# Patient Record
Sex: Female | Born: 1950
Health system: Southern US, Community
[De-identification: ages and names within clinical notes are randomized; demographics above are authoritative.]

## PROBLEM LIST (undated history)

## (undated) DIAGNOSIS — L309 Dermatitis, unspecified: Secondary | ICD-10-CM

## (undated) DIAGNOSIS — E785 Hyperlipidemia, unspecified: Secondary | ICD-10-CM

## (undated) DIAGNOSIS — T7840XA Allergy, unspecified, initial encounter: Secondary | ICD-10-CM

## (undated) DIAGNOSIS — J45909 Unspecified asthma, uncomplicated: Secondary | ICD-10-CM

## (undated) DIAGNOSIS — J301 Allergic rhinitis due to pollen: Secondary | ICD-10-CM

## (undated) DIAGNOSIS — Z9889 Other specified postprocedural states: Secondary | ICD-10-CM

## (undated) DIAGNOSIS — G43909 Migraine, unspecified, not intractable, without status migrainosus: Secondary | ICD-10-CM

## (undated) DIAGNOSIS — R112 Nausea with vomiting, unspecified: Secondary | ICD-10-CM

## (undated) HISTORY — DX: Migraine, unspecified, not intractable, without status migrainosus: G43.909

## (undated) HISTORY — PX: TUBAL LIGATION: SHX77

## (undated) HISTORY — DX: Allergy, unspecified, initial encounter: T78.40XA

## (undated) HISTORY — PX: BREAST SURGERY: SHX581

## (undated) HISTORY — DX: Dermatitis, unspecified: L30.9

## (undated) HISTORY — DX: Unspecified asthma, uncomplicated: J45.909

## (undated) HISTORY — PX: EYE SURGERY: SHX253

## (undated) HISTORY — DX: Hyperlipidemia, unspecified: E78.5

## (undated) HISTORY — PX: FOOT SURGERY: SHX648

## (undated) HISTORY — PX: KNEE SURGERY: SHX244

---

## 1997-08-20 ENCOUNTER — Ambulatory Visit (HOSPITAL_COMMUNITY): Admission: RE | Admit: 1997-08-20 | Discharge: 1997-08-20 | Payer: Self-pay | Admitting: Gynecology

## 1999-05-07 ENCOUNTER — Other Ambulatory Visit: Admission: RE | Admit: 1999-05-07 | Discharge: 1999-05-07 | Payer: Self-pay | Admitting: Gynecology

## 2000-10-19 ENCOUNTER — Other Ambulatory Visit: Admission: RE | Admit: 2000-10-19 | Discharge: 2000-10-19 | Payer: Self-pay | Admitting: Gynecology

## 2001-10-24 ENCOUNTER — Ambulatory Visit (HOSPITAL_COMMUNITY): Admission: RE | Admit: 2001-10-24 | Discharge: 2001-10-24 | Payer: Self-pay | Admitting: Internal Medicine

## 2002-06-26 ENCOUNTER — Other Ambulatory Visit: Admission: RE | Admit: 2002-06-26 | Discharge: 2002-06-26 | Payer: Self-pay | Admitting: Gynecology

## 2003-12-17 ENCOUNTER — Other Ambulatory Visit: Admission: RE | Admit: 2003-12-17 | Discharge: 2003-12-17 | Payer: Self-pay | Admitting: Gynecology

## 2004-07-18 ENCOUNTER — Ambulatory Visit (HOSPITAL_COMMUNITY): Admission: RE | Admit: 2004-07-18 | Discharge: 2004-07-18 | Payer: Self-pay | Admitting: Family Medicine

## 2004-08-01 ENCOUNTER — Ambulatory Visit (HOSPITAL_COMMUNITY): Admission: RE | Admit: 2004-08-01 | Discharge: 2004-08-01 | Payer: Self-pay | Admitting: Family Medicine

## 2004-12-17 ENCOUNTER — Ambulatory Visit (HOSPITAL_COMMUNITY): Admission: RE | Admit: 2004-12-17 | Discharge: 2004-12-17 | Payer: Self-pay | Admitting: Podiatry

## 2006-07-08 ENCOUNTER — Ambulatory Visit (HOSPITAL_COMMUNITY): Admission: RE | Admit: 2006-07-08 | Discharge: 2006-07-08 | Payer: Self-pay | Admitting: Family Medicine

## 2006-07-15 ENCOUNTER — Ambulatory Visit: Payer: Self-pay | Admitting: Gastroenterology

## 2006-07-22 ENCOUNTER — Ambulatory Visit: Payer: Self-pay | Admitting: Internal Medicine

## 2006-07-22 ENCOUNTER — Encounter (INDEPENDENT_AMBULATORY_CARE_PROVIDER_SITE_OTHER): Payer: Self-pay | Admitting: *Deleted

## 2006-07-22 ENCOUNTER — Ambulatory Visit (HOSPITAL_COMMUNITY): Admission: RE | Admit: 2006-07-22 | Discharge: 2006-07-22 | Payer: Self-pay | Admitting: Internal Medicine

## 2006-07-22 HISTORY — PX: ESOPHAGOGASTRODUODENOSCOPY: SHX1529

## 2006-07-22 HISTORY — PX: COLONOSCOPY: SHX174

## 2006-08-23 ENCOUNTER — Ambulatory Visit: Payer: Self-pay | Admitting: Internal Medicine

## 2006-08-30 ENCOUNTER — Ambulatory Visit (HOSPITAL_COMMUNITY): Admission: RE | Admit: 2006-08-30 | Discharge: 2006-08-30 | Payer: Self-pay | Admitting: Internal Medicine

## 2006-12-27 ENCOUNTER — Ambulatory Visit: Payer: Self-pay | Admitting: Internal Medicine

## 2007-11-25 ENCOUNTER — Ambulatory Visit (HOSPITAL_COMMUNITY): Admission: RE | Admit: 2007-11-25 | Discharge: 2007-11-25 | Payer: Self-pay | Admitting: Family Medicine

## 2007-12-09 ENCOUNTER — Ambulatory Visit: Payer: Self-pay | Admitting: Internal Medicine

## 2008-11-19 ENCOUNTER — Encounter (INDEPENDENT_AMBULATORY_CARE_PROVIDER_SITE_OTHER): Payer: Self-pay

## 2008-11-30 ENCOUNTER — Encounter: Payer: Self-pay | Admitting: Internal Medicine

## 2008-12-19 ENCOUNTER — Encounter: Payer: Self-pay | Admitting: Internal Medicine

## 2008-12-19 DIAGNOSIS — R7989 Other specified abnormal findings of blood chemistry: Secondary | ICD-10-CM | POA: Insufficient documentation

## 2008-12-19 LAB — CONVERTED CEMR LAB
ALT: 49 units/L — ABNORMAL HIGH (ref 0–35)
Bilirubin, Direct: 0.1 mg/dL (ref 0.0–0.3)
Ferritin: 178 ng/mL (ref 10–291)
Indirect Bilirubin: 0.4 mg/dL (ref 0.0–0.9)
Saturation Ratios: 52 % (ref 20–55)
TIBC: 314 ug/dL (ref 250–470)
Total Protein: 6.6 g/dL (ref 6.0–8.3)
UIBC: 150 ug/dL

## 2009-01-28 ENCOUNTER — Encounter: Payer: Self-pay | Admitting: Gastroenterology

## 2009-02-12 ENCOUNTER — Ambulatory Visit: Payer: Self-pay | Admitting: Internal Medicine

## 2009-02-12 DIAGNOSIS — K7689 Other specified diseases of liver: Secondary | ICD-10-CM | POA: Insufficient documentation

## 2009-02-12 DIAGNOSIS — K760 Fatty (change of) liver, not elsewhere classified: Secondary | ICD-10-CM | POA: Insufficient documentation

## 2009-02-18 DIAGNOSIS — Z860100 Personal history of colon polyps, unspecified: Secondary | ICD-10-CM | POA: Insufficient documentation

## 2009-02-20 ENCOUNTER — Encounter: Payer: Self-pay | Admitting: Internal Medicine

## 2009-02-20 ENCOUNTER — Encounter (INDEPENDENT_AMBULATORY_CARE_PROVIDER_SITE_OTHER): Payer: Self-pay

## 2009-02-21 LAB — CONVERTED CEMR LAB
AST: 29 units/L (ref 0–37)
Albumin: 4.2 g/dL (ref 3.5–5.2)
Alkaline Phosphatase: 99 units/L (ref 39–117)
Ferritin: 184 ng/mL (ref 10–291)
Iron: 162 ug/dL — ABNORMAL HIGH (ref 42–145)
Total Protein: 6.6 g/dL (ref 6.0–8.3)
UIBC: 155 ug/dL

## 2009-03-26 ENCOUNTER — Other Ambulatory Visit: Admission: RE | Admit: 2009-03-26 | Discharge: 2009-03-26 | Payer: Self-pay | Admitting: Obstetrics & Gynecology

## 2009-03-27 ENCOUNTER — Ambulatory Visit (HOSPITAL_COMMUNITY): Admission: RE | Admit: 2009-03-27 | Discharge: 2009-03-27 | Payer: Self-pay | Admitting: Obstetrics & Gynecology

## 2009-05-15 ENCOUNTER — Ambulatory Visit (HOSPITAL_COMMUNITY): Admission: RE | Admit: 2009-05-15 | Discharge: 2009-05-15 | Payer: Self-pay | Admitting: Family Medicine

## 2009-07-29 ENCOUNTER — Encounter (INDEPENDENT_AMBULATORY_CARE_PROVIDER_SITE_OTHER): Payer: Self-pay

## 2009-08-02 ENCOUNTER — Telehealth (INDEPENDENT_AMBULATORY_CARE_PROVIDER_SITE_OTHER): Payer: Self-pay

## 2010-08-07 NOTE — Letter (Signed)
Summary: Recall, Labs Needed  [facility]  [address]   [place], [place] [address]   Phone: [phone]  Fax: [phone]    July 29, 2009  JARRETT CHICOINE [address] [place], [place]  [address] Sep 06, 1950   Dear Ms. [facility],   Our records indicate it is time to repeat your blood work.  You can take the enclosed form to the lab on or near the date indicated.  Please make note of the new location of the lab:   [address], 2nd floor   [facility]  Our office will call you within a week to ten business days with the results.  If you do not hear from [place] in 10 business days, you should call the office.  If you have any questions regarding this, call the office at [phone], and ask for the nurse.  Labs are due on 08/13/2009.   Sincerely,    Hendricks Limes LPN  [facility] Ph: [phone]   Fax: [phone]

## 2010-08-07 NOTE — Progress Notes (Signed)
----   Converted from flag ---- ---- 08/02/2009 10:58 AM, Diana Eves wrote: Constance Goltz March 07, 2051 CALLED, SHE SAID SHE GOT A LETTER ABOUT HAVING HER LABS DONE. SHE IS NOW UNEMPLOYED AND NO INSURANCE. SHE WILL HAVE THIS DONE ONCE SHE GETS BACK ON HER FEET FINANCIALLY. ------------------------------

## 2010-11-18 NOTE — Assessment & Plan Note (Signed)
Julie Brown, Julie Brown                CHART#:  [id number]   DATE:  12/09/2007                       DOB:  25-Apr-1951   FOLLOWUP:  A fatty-appearing liver on CT, elevated SGPT, compound  heterozygote for hemochromatosis, and left lower rib cage pain.   Julie Brown returns for a 1-year followup.  We saw her for flank and back  pain last year, she had been extensively evaluated.  She has had a  colonoscopy and EGD, which demonstrated a small hiatal hernia and  adenomatous polyps, which were removed.  She is due for surveillance  colonoscopy in 2013.  It is felt that her left rib cage pain is more  musculoskeletal in origin.  She apparently has stable calcified  pulmonary nodules for which she recently had a chest CT (do not have a  report), but she was told that things looked good.  She has not smoked  in 20 years.   She lastly had a blood work on Nov 07, 2007.  Her iron saturation was 50.  Her serum iron was slightly up at 150.  Her LFTs look perfect except for  SGPT, one point above normal at 36.  Ferritin was 120.  This is actually  improved from April 08, 2007, where her iron was 216.  Her percent  saturation was 61%.  Ferritin was 228.  She used to consume 3 beers  daily, but has not consumed any alcohol in about a year now.  She is  trying to get more fiber in and get more exercise.   Her weight is down 8 pounds since her December 27, 2006, office visit here.   She is really not having any GI symptoms at this time except for 3-4  days out of a month.  She may have multiple episodes of diarrhea first  thing in the morning and then it tapers off later in the day.  Otherwise, she has 1-2 formed bowel movements daily.  She has not had  any melena or rectal bleeding.   CURRENT MEDICATIONS:  See updated list.   ALLERGIES:  CODEINE.   PHYSICAL EXAMINATION:  GENERAL:  Today, pleasant 60 year old lady  resting comfortably.  VITAL SIGNS:  Weight 205, height 5 feet 4 inches, temperature  98.1, BP  128/82, and pulse 88.  SKIN:  Warm and dry.  There is no bronzing, no jaundice, and no  cutaneous stigmata of chronic liver disease.  HEENT:  No scleral icterus.  Conjunctivae are pink.  CHEST:  Lungs are clear to auscultation.  CARDIAC:  Regular rate and rhythm without murmur, gallop, or rub.  ABDOMEN:  Nondistended.  Positive bowel sounds.  Soft.  No obvious mass  or hepatosplenomegaly.  EXTREMITIES:  No edema.   IMPRESSION:  1. Left costal margin flank pain, not likely gastrointestinal in      origin, has been stable for a prolonged period of time.  Followup      per Dr. Gerda Diss.  2. History of colonic adenoma, due for surveillance colonoscopy in      2013.  3. History of a fatty-appearing liver on CT and compound heterozygous      for hemochromatosis.  She has a compound heterozygous and these      folks rarely accumulate iron, but it can happen over time.  We  always need to keep the possibility of non-HFE hemochromatosis in      mind, but her more recent iron studies are much better, and I would      like to see her LFTs completely normalized.  4. Intermittent early morning diarrhea, most consistent with irritable      bowel syndrome.   RECOMMENDATIONS:  1. Continue alcohol abstinence, exercise, healthy lifestyle, and      continued weight loss.  2. Repeat hepatic profile in 3 months.  We will plan at a minimum due      iron studies in 1 year when she returns.  3. We will give her a prescription for Levsin sublingual 0.125 mg.      She is to take 1 before meals on a p.r.n. basis for her occasional      symptoms of diarrhea.  If this is not satisfactory, she is to let      me know.        Jonathon Bellows, M.D.  Electronically Signed     RMR/MEDQ  D:  12/09/2007  T:  12/09/2007  Job:  [id number]   cc:   Lorin Picket A. Gerda Diss, MD

## 2010-11-18 NOTE — Assessment & Plan Note (Signed)
Julie Brown, Julie Brown                CHART#:  [id number]   DATE:  12/27/2006                       DOB:  29-Dec-1950   CHIEF COMPLAINT:  Follow up left upper quadrant abdominal pain.   SUBJECTIVE:  The patient is a 60 year old female who has a history of  chronic left-sided left flank and left upper quadrant pain.  She  underwent MR of the T and L spine by Dr. Jena Gauss on August 30, 2006.  She was found to have mild degenerative disk disease of the thoracic  spine without notable central canal or foraminal narrowing.  She also  had degenerative disease of the cervical spine as well.  She has a  history of fatty liver seen on CT scan.  She has had an elevated ALT.  Recent LFTs from Dec 02, 2006, shows an ALT of 55 and otherwise normal  LFTs.  She does continue to drink approximately 3 beers a night and has  for many years now.  She denies any problems with nausea, vomiting,  heartburn, indigestion, or abdominal pain other than an aching in her  left side and left flank area just below her left ribs.  She describes  the pain as anywhere from 4 to 8 on a pain scale.  It is not associated  with eating and is not worsened with movement.  She has been diagnosed  with arthritis.  She denies any urinary symptoms.  She does take a rare  Vicodin as needed.  Her weight is up 4 pounds in the last 4 months.   CURRENT MEDICATIONS:  See list from December 27, 2006.   ALLERGIES:  CODEINE.   PHYSICAL EXAMINATION:  VITAL SIGNS:  Weight 213 pounds, height 64  inches.  Temperature 97.9, blood pressure 140/96, pulse 80.  GENERAL:  The patient is an obese Caucasian female who is alert and  oriented, pleasant and cooperative, in no acute distress.  HEENT:  Sclerae are clear, nonicteric.  Conjunctivae are pink.  Oropharynx pink and moist without any lesions.  CHEST:  Heart regular rate and rhythm, normal S1, S2.  ABDOMEN:  Positive bowel sounds x4.  No bruits auscultated.  Soft,  nontender, nondistended,  without palpable mass or hepatosplenomegaly.  No rebound tenderness or guarding.  EXTREMITIES:  Without clubbing or edema bilaterally.  SKIN:  Pink, warm and dry without any rash or jaundice.   ASSESSMENT:  The patient is a 60 year old Caucasian female with left  rib, left flank, left side pain, which I suspect is of musculoskeletal  origin.  She also has fatty liver and a mildly elevated ALT.  This could  be related to her daily alcohol consumption but at this point since it  has been persistent, would rule out hepatitis B and C, although this is  an unlikely cause.  She also has a small nodule in the right lower lobe  of her chest, which needs to be followed up by Dr. Gerda Diss, and a history  of adenomatous polyps.   PLAN:  1. Colonoscopy in January 2013 for follow-up of adenomatous polyps.  2. Check HCV antibody and hepatitis B surface antigen.  3. Repeat LFTs in 3 months.  4. If LFTs remain stable or normal, will follow up in 1 year.      Otherwise, she is going to need sooner follow-up.  5. I have asked her to significantly decrease her alcoholic beverage      intake to a couple of beers a couple of times a week rather than on      a daily basis.  6. Follow with chest CT in January 2009 through Dr. Fletcher Anon office.       Julie Brown, N.P.  Electronically Signed     R. Roetta Sessions, M.D.  Electronically Signed    KJ/MEDQ  D:  12/27/2006  T:  12/28/2006  Job:  [id number]   cc:   Lorin Picket A. Gerda Diss, MD

## 2010-11-21 NOTE — Op Note (Signed)
NAMEKERRIE, Julie Brown               ACCOUNT NO.:  [id number]   MEDICAL RECORD NO.:  [id number]          PATIENT TYPE:  AMB   LOCATION:  DAY                           FACILITY:  [facility]   PHYSICIAN:  R. Roetta Sessions, M.D. DATE OF BIRTH:  1950-07-15   DATE OF PROCEDURE:  07/22/2006  DATE OF DISCHARGE:                               OPERATIVE REPORT   Esophagogastroduodenoscopy, diagnostic, followed by colonoscopy with  snare polypectomy.   INDICATIONS FOR PROCEDURE:  A 55-year lady with left upper quadrant  abdominal pain, question gastric wall thickening on CT, nonspecific  stranding in the mesentery right lower quadrant, fatty liver with normal  LFTs, 3-mm right lower lobe lung nodule to be followed up further by Dr.  Gerda Diss as appropriate.  Positive family history of colon cancer in a  first degree relative at a young age.  EGD and colonoscopy are now being  done.  This approach has been discussed the patient at length.  Potential risks, benefits and alternatives have been reviewed, questions  answered.  She is agreeable.  Please documentation in the medical  record.   PROCEDURE NOTE:  O2 saturation, blood pressure, pulse and respirations  monitored throughout the entirety of both procedures.  Conscious  sedation with Versed 10 mg IV, Demerol 25 mg IV in divided doses.  Cetacaine spray for topical oropharyngeal anesthesia.   INSTRUMENT:  Pentax video chip system.   FINDINGS:  Esophagogastroduodenoscopy:  Examination of tubular esophagus  revealed no mucosal abnormalities.  EG junction easily traversed.   Stomach:  Gastric cavity was emptied and insufflated well with air.  Thorough examination of gastric mucosa including retroflexion to view  the proximal stomach and esophagogastric junction demonstrated a small  hiatal hernia.  Otherwise gastric mucosa appeared normal.  Stomach was  insufflated very well.  Pylorus patent, easily traversed.  Examination  of the bulb and second  portion revealed no abnormalities.   THERAPEUTIC/DIAGNOSTIC MANEUVERS PERFORMED:  None.   The patient tolerated the procedure well.  She was prepared for  colonoscopy.  Digital rectal exam revealed no abnormalities.   ENDOSCOPIC FINDINGS:  Prep was good.   Colon:  Colonic mucosa was surveyed from the rectosigmoid junction  through the left, transverse and right colon to the area of the  appendiceal orifice, ileocecal valve and cecum.  These structures were  well seen and photographed for the record.  Terminal ileum was intubated  to 5 cm.  From this level, scope was slowly withdrawn.  All previously  mentioned mucosal surfaces were again seen.  The patient had shallow  narrow mouth left-sided diverticula.  Multiple 5-mm polyps in the  hepatic and splenic flexures, sigmoid and rectum.  These were hot or  cold snare removed.  The scope was pulled down into the rectum and  additionally surveyed, including retroflexed view of the anal verge.  Aside from a single 5-mm polyp which was hot snare removed, the rectal  mucosa appeared normal.  The patient tolerated both procedures well and  was reactive to endoscopy.   IMPRESSION:  Normal esophagus, small hiatal hernia otherwise normal  stomach,  normal D1/D2.   Colonoscopy findings:  1. Rectal and colonic polyps as described above, removed with hot      snare.  2. Left-sided diverticula. The remainder of colon and rectum appeared      normal.  Terminal ileum appeared normal.   Today's findings do not reveal a cause of the patient's left sided  abdominal pain.   RECOMMENDATIONS:  1. No aspirin or arthritis medications for 10 days.  2. Follow-up on pathology.  3. Diverticulosis literature provided to Julie Brown.  4. Daily fiber supplementation.  5. Follow-up appointment with [place] in 1 month.   ADDENDUM:  CBC, amylase and lipase were all within normal limits through  our office on July 16, 2006.      Jonathon Bellows, M.D.   Electronically Signed     RMR/MEDQ  D:  07/22/2006  T:  07/22/2006  Job:  [id number]   cc:   Gerda Diss, M.D.

## 2010-11-21 NOTE — Op Note (Signed)
Julie Brown, Julie Brown               ACCOUNT NO.:  [id number]   MEDICAL RECORD NO.:  [id number]          PATIENT TYPE:  AMB   LOCATION:  DAY                           FACILITY:  [facility]   PHYSICIAN:  Denny Peon. Ulice Brilliant, D.P.M.  DATE OF BIRTH:  [birth year]   DATE OF PROCEDURE:  12/17/2004  DATE OF DISCHARGE:  12/17/2004                                 OPERATIVE REPORT   PREOPERATIVE DIAGNOSIS:  Chronic capsulitis sub fifth metatarsal with  plantar flexor fifth metatarsal left foot.   POSTOPERATIVE DIAGNOSIS:  Chronic capsulitis sub fifth metatarsal with  plantar flexor fifth metatarsal left foot.   PROCEDURES:  Elevating osteotomy with fifth metatarsal left foot.   SURGEON:  Denny Peon. Ulice Brilliant, D.P.M.   ANESTHESIA:  MAC.   INDICATIONS FOR SURGERY:  A 52-month history of pain sub fifth metatarsal  left foot which has been treated conservatively with changes in shoe gear,  non-steroidal anti-inflammatory medication, injection therapy, none of which  has helped long term.  The patient has a clinically plantar flexed fifth  metatarsal with pain to palpation.  The patient has requested surgical  correction.   DESCRIPTION OF PROCEDURE:  Julie Brown was brought into the OR and placed on  the table in supine position.  IV sedation was established, and Mayo block  was performed about the fifth MTP of her left foot.  A pneumatic ankle  tourniquet was applied across her left ankle.  Her foot is prepped and  draped in the usual aseptic fashion.  An ACE bandage utilized to  exsanguinate her foot.  The tourniquet was inflated to 250 mmHg.   PROCEDURES:  Elevating osteotomy fifth metatarsal left foot.  Attention was  directed to the fifth metatarsal phalangeal joint.  A 4 cm dorsal inner skin  incision.  Incision is deepened by sharp and blunt dissection.  The extensor  tendon to the fifth toe is identified, undermined and retracted medially.  The fifth metatarsal is encountered.  It's boundaries are  appreciated.  A  dorsal linear capsulotomy is performed.  The capsular no acute distress  periosteal tissues are reflected away from the surgical head and neck of the  fifth metatarsal.  An oblique osteotomy is then made with the orientation of  the osteotomy, osteotomy being from distal lateral to proximal medial.  The  osteotomy is made completely through the medial and lateral cortex of the  bone.  The capital fragment is then relocated slightly proximally, slightly  medially and about 3-4 mm dorsally elevating the fifth metatarsal from the  weight bearing surface.  This is then fixated by 2.0x14 mm Osteomed screw,  self tapping screw fixation.  Osteotomy is deemed stable.  Redundant bone  laterally and plantarly is excised with a bone rongeur.  The newly formed  osseous surface is rasped smooth.  Postoperative images obtained by a Zy  scan.  The wound was flushed with copious amounts of irrigant. Capsular and  periosteal tissues were closed with 4-0 Vicryl.  Subcutaneous tissues  reapproximated and closed with a running horizontal mattress suture, 4-0  Vicryl.  Skin is closed  with 4-0 Vicryl and subcuticular suture.  Steri-  Strips were applied across the incision.  A postoperative injection of  Marcaine __________ dispensed.  Betadine soaked Adaptic dressing and dry  sterile compressive dressing follows.  Tourniquet was deflated with  tourniquet time spending 35 minutes.   Julie Brown tolerated the incision procedure well.  She was placed into a Cam  walker postoperatively.  Prescription for Tilden Fossa is dispensed.  A  first postoperative visit appointment is made.  She will be seen within  seven days for first postop visit by Dr. Emilio Math.       CMD/MEDQ  D:  12/18/2004  T:  12/19/2004  Job:  [id number]

## 2010-11-21 NOTE — Op Note (Signed)
[facility]  Patient:    Julie Brown, Julie Brown. Visit Number: [id number] MRN: [id number]          Service Type: Attending:  Roetta Sessions, M.D. Dictated by:   Roetta Sessions, M.D. Proc. Date: 10/24/01   CC:         Loran Senters, M.D.   Operative Report  PROCEDURE:  Diagnostic colonoscopy.  INDICATIONS FOR PROCEDURE:  The patient is a 60 year old lady with an episode of hematochezia recently. Colonoscopy was done to further evaluate her symptoms. She has a positive family history of colorectal cancer. Aside from this recent three day history of some blood per rectum, she has not had any blood per rectum at other times and is devoid of any large ______ symptoms. This approach has been discussed with [name]. The potential risks, benefits, and alternatives have been reviewed, questions answered and she is agreeable. Please see my dictated consultation note for more information.  PROCEDURE NOTE:  O2 saturation, blood pressure, pulse, and respirations were monitored throughout the entire procedure. Conscious sedation Versed 5 mg IV in divided doses, Demerol 100 mg IV in divided doses. Cetacaine spray for topical pharyngeal anesthesia. Instrument Olympus video colonoscope.  FINDINGS:  Digital rectal examination revealed no abnormalities.  ENDOSCOPIC FINDINGS:  The prep was adequate.  RECTUM:  Examination of the rectal mucosa including retroflexed view of the anal verge and ______ revealed only minimal internal hemorrhoids.  COLON:  The colonic mucosa was thoroughly debrided from the rectosigmoid junction through the left transverse and right colon to the area of the appendiceal orifice, ileocecal valve, and cecum. These structures were well seen and photographed. No colonic mucosal abnormalities were noted upon advancing the scope from the level of cecum to the ileocecal valve. The scope was slowly withdrawn. All previously mentioned mucosal surfaces  were again seen and again no abnormalities were observed. The patient tolerated the procedure well and was reacted in endoscopy.  IMPRESSION:  1. Minimal internal anal canal hemorrhoids otherwise normal rectum.  2. Normal colon.  RECOMMENDATIONS:  1. A 10 day course of Anusol-HC suppositories one per rectum at bedtime.  2. Hemorrhoid literature, increase fiber in diet.  3. Repeat colonoscopy in five years. Dictated by:   Roetta Sessions, M.D. Attending:  Roetta Sessions, M.D. DD:  10/24/01 TD:  10/24/01 Job: [id number]

## 2010-11-21 NOTE — H&P (Signed)
Julie Brown, Julie Brown               ACCOUNT NO.:  [id number]   MEDICAL RECORD NO.:  [id number]          PATIENT TYPE:  AMB   LOCATION:  DAY                           FACILITY:  [facility]   PHYSICIAN:  Denny Peon. Ulice Brilliant, D.P.M.  DATE OF BIRTH:  21-Mar-1951   DATE OF ADMISSION:  12/17/2004  DATE OF DISCHARGE:  LH                                HISTORY & PHYSICAL   HISTORY OF PRESENT ILLNESS:  Julie Brown has pain in the sub-fifth metatarsal  region of her left foot which has been present for about nine months.  [name] relates that along the lines of October 2005, she jumped out of a [employer]  and landed on her foot awkwardly and her foot has not gotten any better  since that time.  Julie Brown has been treated conservatively by reducing  pressure on the area, anti-inflammatory medication, injection therapy, none  of which has helped considerably.  She has been through a plain film x-ray  and a bone scan which are relatively unremarkable.   PAST MEDICAL HISTORY:  Essentially is unremarkable.   MEDICATIONS:  No medications at this point.   ALLERGIES:  CODEINE, which causes nausea and vomiting.   SOCIAL HISTORY:  She does not smoke.  She drinks very occasionally.   PHYSICAL EXAMINATION:  She is uncomfortable with deep palpation of sub-fifth  metatarsal of the left foot.  She is noted clinically to have a plantar  flexed fifth metatarsal.  Again, as stated, both bone scan and radiograph  were relatively unremarkable.   ASSESSMENT:  Plantar flexed fifth metatarsal with capsulitis _____________,  not improved over a nine month period of time with injection therapy and non-  steroidal anti-inflammatory drugs.   PLAN:  I have described to Julie Brown that if she has not gotten better then  she is probably going to need to have the fifth metatarsal head elevated via  a surgical osteotomy.  I have described the procedure to her.  This will  consist of a through-and-through cut of the fifth metatarsal  surgical neck  migrating the fifth metatarsal head dorsally and then fixating this via 2.0  Osteo-Med screw.  She will be in a CAM walker postoperatively and a surgical  shoe.  I have described this to Julie Brown.  She has read a consent form,  apparently understood, and signed.  We will do this under monitored  anesthesia care.       CMD/MEDQ  D:  12/16/2004  T:  12/16/2004  Job:  [id number]

## 2010-11-21 NOTE — Consult Note (Signed)
Julie Brown, Julie Brown               ACCOUNT NO.:  [id number]   MEDICAL RECORD NO.:  [id number]          PATIENT TYPE:  AMB   LOCATION:  DAY                           FACILITY:  [facility]   PHYSICIAN:  R. Roetta Sessions, M.D. DATE OF BIRTH:  July 06, 1951   DATE OF CONSULTATION:  07/15/2006  DATE OF DISCHARGE:                                 CONSULTATION   REASON FOR CONSULTATION:  Abnormal CT, abdominal pain.   HISTORY OF PRESENT ILLNESS:  Julie Brown is a 60 year old lady who presents  for further evaluation of persistent abdominal pain. She states her  abdominal pain began in December of 2007. It is located in the left  upper quadrant and down the left mid abdomen. She describes the pain as  constant, usually a 5 out of 10 on a pain scale. At times it is severe,  and 8 out of 10. It seems to be worse with movement or with prolonged  sitting or standing. It seems to be worse after eating fish yesterday.  Bowel movements have been unchanged. She has daily bowel movements with  no melena. Occasionally has fresh blood on the toilet tissue. She has a  history of internal hemorrhoids. She denies any typical heart burn  symptoms, dysphagia or odynophagia. Her appetite has been good. She has  no unintentional weight loss. Denies any dysuria, hematuria.   She had a CT of the abdomen and pelvis on July 08, 2006. This revealed  a tiny 3 mm in diameter nodule in the right lower lobe of the lung,  questionably calcified. Marked fatty infiltration of the liver. There  was incomplete gastric distention with prominence of the fundal wall  making it impossible to exclude wall thickening including inflammation  and tumor. She had minimal nonspecific stranding of the mesentery in the  right lower quadrant region. Questionable uterine fibroid versus  septation anomaly of the uterus. Liver function tests were normal. Met-7  unremarkable. CBC normal. She was started on Protonix which has not  seemed to help but it  has only been 3 days.   CURRENT MEDICATIONS:  1. Zyrtec 10 mg daily.  2. Multivitamin daily.  3. Aspirin 325 mg daily.  4. Protonix 40 mg daily.  5. Hydrocodone 5/500 one q.6 hours as needed.  6. Fish oil two daily.  7. Calcium and vitamin D 1200 mg daily.   ALLERGIES:  CODEINE.   PAST MEDICAL HISTORY:  1. Seasonal allergies.  2. History of tubal ligation.  3. Left breast surgery for a lump.  4. Left foot surgery.  5. Oral surgery.  6. Patient had a colonoscopy in April, 2003. She had minimal internal      anal canal hemorrhoids otherwise negative.   FAMILY HISTORY:  Mother had a resection for colon cancer when in her  50's; she had another primary colon cancer last year recurring  resection; primary kidney and bladder cancer as well. The kidney cancer  recurred once and she required chemotherapy.   SOCIAL HISTORY:  She is married and has one child. She works at the  [employer]. She quit smoking 18  years ago. She consumes two beers  daily.   REVIEW OF SYSTEMS:  See history of present illness for GI.  CARDIOPULMONARY: No chest pain or shortness of breath. See HPI for GU.   PHYSICAL EXAMINATION:  VITAL SIGNS: Weight is 213 pounds. Height 5 feet,  4 inches. Temperature 97.6, blood pressure 132/84, pulse 64.  GENERAL: A pleasant, obese Caucasian female in no acute distress.  SKIN: Warm and dry, no jaundice.  HEENT: Sclerae non icteric. Oropharyngeal mucosa moist and pink, no  lesion, erythema or exudates.  NECK: No lymphadenopathy or thyromegaly.  CHEST: Lungs are clear to auscultation.  CARDIAC EXAM: Reveals regular rate and rhythm, no murmurs, rubs, or  gallops.  ABDOMEN: Positive bowel sounds, obese but symmetrical, soft. She has  mild to moderate epigastric tenderness, she has moderate tenderness in  the left upper quadrant all the way down the left mid abdomen to deep  palpation. No tenderness along the rib margin or in the left flank. No  guarding or rebound  tenderness. No organomegaly or masses appreciated.  EXTREMITIES: No edema.   CT of abdomen and pelvis as outlined above. In addition she had a small  soft tissue nodule at the splenic hilum, question small splenule 11 mm  in diameter, less likely lymph node.   IMPRESSION:  Julie Brown is a 60 year old lady with recent development of  persistent left upper quadrant/left mid abdominal pain. She also has  epigastric discomfort on exam. CT really is unrevealing with regards to  the cause of her abdominal pain. There is a question of gastric wall  thickening versus under-distention on CT as well as some nonspecific  stranding in the mesentery although this is in her right lower quadrant  where she does not have any pain. She does have a fatty liver with  normal LFTs. She also has a 3 mm nodule in the right lower lobe of the  lung, will leave that up to Dr. Gerda Diss for any further evaluation  needed. I have discussed with her today that I do not feel that any  abnormal found in the stomach would explain the extent of her left-side  abdominal pain but we ought to go ahead and proceed with upper endoscopy  for further evaluation there. She is also due for high risk surveillance  colonoscopy given her family history of colorectal cancer in 3 months  and will go ahead and pursue that now, given unexplained left side  abdominal pain.   PLAN:  1. Esophagogastroduodenoscopy and coloscopy in the near future with      Dr. Jena Gauss.  2. Trial of Levbid #60 one p.o. b.i.d. p.r.n. abdominal pain, 1      refill.  3. We recheck her CBC and amylase and lipase.  4. Further recommendations to follow.     Dictated by Tana Coast, P.A.      Tana Coast, P.AJonathon Bellows, M.D.  Electronically Signed    LL/MEDQ  D:  07/15/2006  T:  07/15/2006  Job:  [id number]

## 2011-06-23 ENCOUNTER — Encounter: Payer: Self-pay | Admitting: Internal Medicine

## 2012-01-06 ENCOUNTER — Encounter (HOSPITAL_COMMUNITY)
Admission: EM | Disposition: A | Discharge status: Home / Self Care | Payer: Self-pay | Source: Home / Self Care | Attending: General Surgery

## 2012-01-06 ENCOUNTER — Inpatient Hospital Stay (HOSPITAL_COMMUNITY): Payer: Self-pay | Admitting: Anesthesiology

## 2012-01-06 ENCOUNTER — Inpatient Hospital Stay (HOSPITAL_COMMUNITY)
Admission: EM | Admit: 2012-01-06 | Discharge: 2012-01-07 | DRG: 419 | Disposition: A | Discharge status: Home / Self Care | Payer: Self-pay | Attending: General Surgery | Admitting: General Surgery

## 2012-01-06 ENCOUNTER — Encounter (HOSPITAL_COMMUNITY): Payer: Self-pay

## 2012-01-06 ENCOUNTER — Emergency Department (HOSPITAL_COMMUNITY): Payer: Self-pay

## 2012-01-06 ENCOUNTER — Encounter (HOSPITAL_COMMUNITY): Payer: Self-pay | Admitting: Anesthesiology

## 2012-01-06 DIAGNOSIS — K810 Acute cholecystitis: Principal | ICD-10-CM | POA: Diagnosis present

## 2012-01-06 DIAGNOSIS — Z885 Allergy status to narcotic agent status: Secondary | ICD-10-CM

## 2012-01-06 DIAGNOSIS — Z87891 Personal history of nicotine dependence: Secondary | ICD-10-CM

## 2012-01-06 HISTORY — PX: CHOLECYSTECTOMY: SHX55

## 2012-01-06 HISTORY — DX: Other specified postprocedural states: Z98.890

## 2012-01-06 HISTORY — DX: Allergic rhinitis due to pollen: J30.1

## 2012-01-06 HISTORY — DX: Nausea with vomiting, unspecified: R11.2

## 2012-01-06 LAB — CBC WITH DIFFERENTIAL/PLATELET
Basophils Absolute: 0 10*3/uL (ref 0.0–0.1)
Basophils Relative: 0 % (ref 0–1)
Eosinophils Absolute: 0.1 10*3/uL (ref 0.0–0.7)
MCH: 32.3 pg (ref 26.0–34.0)
MCHC: 35.3 g/dL (ref 30.0–36.0)
Neutro Abs: 5.5 10*3/uL (ref 1.7–7.7)
Neutrophils Relative %: 65 % (ref 43–77)
Platelets: 215 10*3/uL (ref 150–400)
RDW: 12.4 % (ref 11.5–15.5)

## 2012-01-06 LAB — COMPREHENSIVE METABOLIC PANEL
AST: 30 U/L (ref 0–37)
Albumin: 4 g/dL (ref 3.5–5.2)
Alkaline Phosphatase: 107 U/L (ref 39–117)
BUN: 19 mg/dL (ref 6–23)
Potassium: 3.7 mEq/L (ref 3.5–5.1)
Sodium: 139 mEq/L (ref 135–145)
Total Protein: 7.1 g/dL (ref 6.0–8.3)

## 2012-01-06 LAB — LIPASE, BLOOD: Lipase: 24 U/L (ref 11–59)

## 2012-01-06 LAB — SURGICAL PCR SCREEN
MRSA, PCR: NEGATIVE
Staphylococcus aureus: NEGATIVE

## 2012-01-06 SURGERY — LAPAROSCOPIC CHOLECYSTECTOMY
Anesthesia: General | Site: Abdomen | Wound class: Contaminated

## 2012-01-06 MED ORDER — ROCURONIUM BROMIDE 100 MG/10ML IV SOLN
INTRAVENOUS | Status: DC | PRN
  Administered 2012-01-06: 5 mg via INTRAVENOUS
  Administered 2012-01-06: 25 mg via INTRAVENOUS

## 2012-01-06 MED ORDER — SUCCINYLCHOLINE CHLORIDE 20 MG/ML IJ SOLN
INTRAMUSCULAR | Status: DC | PRN
  Administered 2012-01-06: 50 mg via INTRAVENOUS
  Administered 2012-01-06: 140 mg via INTRAVENOUS

## 2012-01-06 MED ORDER — SODIUM CHLORIDE 0.9 % IJ SOLN
INTRAMUSCULAR | Status: AC
  Administered 2012-01-06: 10 mL
  Filled 2012-01-06: qty 3

## 2012-01-06 MED ORDER — ONDANSETRON HCL 4 MG/2ML IJ SOLN
4.0000 mg | Freq: Once | INTRAMUSCULAR | Status: AC
  Administered 2012-01-06: 4 mg via INTRAVENOUS
  Filled 2012-01-06: qty 2

## 2012-01-06 MED ORDER — DEXAMETHASONE SODIUM PHOSPHATE 4 MG/ML IJ SOLN
INTRAMUSCULAR | Status: AC
  Administered 2012-01-06: 4 mg via INTRAVENOUS
  Filled 2012-01-06: qty 1

## 2012-01-06 MED ORDER — FENTANYL CITRATE 0.05 MG/ML IJ SOLN
25.0000 ug | INTRAMUSCULAR | Status: DC | PRN
  Administered 2012-01-06 (×4): 50 ug via INTRAVENOUS

## 2012-01-06 MED ORDER — LIDOCAINE HCL (CARDIAC) 10 MG/ML IV SOLN
INTRAVENOUS | Status: DC | PRN
  Administered 2012-01-06: 10 mg via INTRAVENOUS

## 2012-01-06 MED ORDER — HYDROMORPHONE HCL PF 1 MG/ML IJ SOLN
1.0000 mg | Freq: Once | INTRAMUSCULAR | Status: AC
  Administered 2012-01-06: 1 mg via INTRAVENOUS
  Filled 2012-01-06: qty 1

## 2012-01-06 MED ORDER — CEFAZOLIN SODIUM-DEXTROSE 2-3 GM-% IV SOLR
2.0000 g | INTRAVENOUS | Status: DC
  Administered 2012-01-06: 2 g via INTRAVENOUS

## 2012-01-06 MED ORDER — FENTANYL CITRATE 0.05 MG/ML IJ SOLN
INTRAMUSCULAR | Status: DC | PRN
  Administered 2012-01-06 (×3): 50 ug via INTRAVENOUS
  Administered 2012-01-06 (×2): 25 ug via INTRAVENOUS
  Administered 2012-01-06: 50 ug via INTRAVENOUS

## 2012-01-06 MED ORDER — SUCCINYLCHOLINE CHLORIDE 20 MG/ML IJ SOLN
INTRAMUSCULAR | Status: AC
  Filled 2012-01-06: qty 1

## 2012-01-06 MED ORDER — MIDAZOLAM HCL 2 MG/2ML IJ SOLN
1.0000 mg | INTRAMUSCULAR | Status: DC | PRN
  Administered 2012-01-06: 2 mg via INTRAVENOUS

## 2012-01-06 MED ORDER — SODIUM CHLORIDE 0.9 % IJ SOLN
INTRAMUSCULAR | Status: AC
  Filled 2012-01-06: qty 10

## 2012-01-06 MED ORDER — HEMOSTATIC AGENTS (NO CHARGE) OPTIME
TOPICAL | Status: DC | PRN
  Administered 2012-01-06: 1 via TOPICAL

## 2012-01-06 MED ORDER — MIDAZOLAM HCL 2 MG/2ML IJ SOLN
INTRAMUSCULAR | Status: AC
  Administered 2012-01-06: 2 mg via INTRAVENOUS
  Filled 2012-01-06: qty 2

## 2012-01-06 MED ORDER — ENOXAPARIN SODIUM 40 MG/0.4ML ~~LOC~~ SOLN
40.0000 mg | Freq: Once | SUBCUTANEOUS | Status: AC
  Administered 2012-01-06: 40 mg via SUBCUTANEOUS

## 2012-01-06 MED ORDER — LACTATED RINGERS IV SOLN
INTRAVENOUS | Status: DC
  Administered 2012-01-06: 1000 mL via INTRAVENOUS

## 2012-01-06 MED ORDER — MIDAZOLAM HCL 2 MG/2ML IJ SOLN
INTRAMUSCULAR | Status: AC
Start: 2012-01-06 — End: 2012-01-07
  Filled 2012-01-06: qty 2

## 2012-01-06 MED ORDER — LIDOCAINE HCL (PF) 1 % IJ SOLN
INTRAMUSCULAR | Status: AC
  Filled 2012-01-06: qty 5

## 2012-01-06 MED ORDER — NEOSTIGMINE METHYLSULFATE 1 MG/ML IJ SOLN
INTRAMUSCULAR | Status: DC | PRN
  Administered 2012-01-06: 2 mg via INTRAVENOUS

## 2012-01-06 MED ORDER — ROCURONIUM BROMIDE 50 MG/5ML IV SOLN
INTRAVENOUS | Status: AC
  Filled 2012-01-06: qty 1

## 2012-01-06 MED ORDER — ENOXAPARIN SODIUM 40 MG/0.4ML ~~LOC~~ SOLN
SUBCUTANEOUS | Status: AC
  Administered 2012-01-06: 40 mg via SUBCUTANEOUS
  Filled 2012-01-06: qty 0.4

## 2012-01-06 MED ORDER — BUPIVACAINE HCL (PF) 0.5 % IJ SOLN
INTRAMUSCULAR | Status: DC | PRN
  Administered 2012-01-06: 10 mL

## 2012-01-06 MED ORDER — CELECOXIB 100 MG PO CAPS
200.0000 mg | ORAL_CAPSULE | Freq: Two times a day (BID) | ORAL | Status: DC
  Administered 2012-01-06 – 2012-01-07 (×3): 200 mg via ORAL
  Filled 2012-01-06 (×3): qty 2

## 2012-01-06 MED ORDER — PROPOFOL 10 MG/ML IV EMUL
INTRAVENOUS | Status: AC
  Filled 2012-01-06: qty 20

## 2012-01-06 MED ORDER — PROPOFOL 10 MG/ML IV EMUL
INTRAVENOUS | Status: DC | PRN
  Administered 2012-01-06: 50 mg via INTRAVENOUS
  Administered 2012-01-06: 150 mg via INTRAVENOUS

## 2012-01-06 MED ORDER — SCOPOLAMINE 1 MG/3DAYS TD PT72
MEDICATED_PATCH | TRANSDERMAL | Status: AC
  Administered 2012-01-06: 1.5 mg via TRANSDERMAL
  Filled 2012-01-06: qty 1

## 2012-01-06 MED ORDER — CEFAZOLIN SODIUM 1-5 GM-% IV SOLN
INTRAVENOUS | Status: AC
  Filled 2012-01-06: qty 50

## 2012-01-06 MED ORDER — BUPIVACAINE HCL (PF) 0.5 % IJ SOLN
INTRAMUSCULAR | Status: AC
  Filled 2012-01-06: qty 30

## 2012-01-06 MED ORDER — SCOPOLAMINE 1 MG/3DAYS TD PT72
1.0000 | MEDICATED_PATCH | Freq: Once | TRANSDERMAL | Status: DC
  Administered 2012-01-06: 1.5 mg via TRANSDERMAL

## 2012-01-06 MED ORDER — DEXAMETHASONE SODIUM PHOSPHATE 4 MG/ML IJ SOLN
INTRAMUSCULAR | Status: AC
  Filled 2012-01-06: qty 1

## 2012-01-06 MED ORDER — GLYCOPYRROLATE 0.2 MG/ML IJ SOLN
INTRAMUSCULAR | Status: AC
  Filled 2012-01-06: qty 1

## 2012-01-06 MED ORDER — HYDROCODONE-ACETAMINOPHEN 5-325 MG PO TABS
1.0000 | ORAL_TABLET | ORAL | Status: DC | PRN
  Administered 2012-01-06 – 2012-01-07 (×3): 2 via ORAL
  Filled 2012-01-06 (×3): qty 2

## 2012-01-06 MED ORDER — SODIUM CHLORIDE 0.9 % IJ SOLN
INTRAMUSCULAR | Status: AC
  Filled 2012-01-06: qty 3

## 2012-01-06 MED ORDER — ONDANSETRON HCL 4 MG/2ML IJ SOLN: 4.0000 mg | Freq: Once | INTRAMUSCULAR | Status: DC | PRN

## 2012-01-06 MED ORDER — DEXAMETHASONE SODIUM PHOSPHATE 4 MG/ML IJ SOLN
INTRAMUSCULAR | Status: DC | PRN
  Administered 2012-01-06: 8 mg via INTRAVENOUS

## 2012-01-06 MED ORDER — FENTANYL CITRATE 0.05 MG/ML IJ SOLN
INTRAMUSCULAR | Status: AC
  Administered 2012-01-06: 50 ug via INTRAVENOUS
  Filled 2012-01-06: qty 5

## 2012-01-06 MED ORDER — GLYCOPYRROLATE 0.2 MG/ML IJ SOLN
INTRAMUSCULAR | Status: DC | PRN
  Administered 2012-01-06: 0.2 mg via INTRAVENOUS
  Administered 2012-01-06: 0.4 mg via INTRAVENOUS

## 2012-01-06 MED ORDER — ONDANSETRON HCL 4 MG/2ML IJ SOLN
4.0000 mg | Freq: Once | INTRAMUSCULAR | Status: AC
  Administered 2012-01-06: 4 mg via INTRAVENOUS

## 2012-01-06 MED ORDER — SODIUM CHLORIDE 0.9 % IR SOLN
Status: DC | PRN
  Administered 2012-01-06: 1000 mL

## 2012-01-06 MED ORDER — FENTANYL CITRATE 0.05 MG/ML IJ SOLN
INTRAMUSCULAR | Status: AC
  Administered 2012-01-06: 50 ug via INTRAVENOUS
  Filled 2012-01-06: qty 2

## 2012-01-06 MED ORDER — ONDANSETRON HCL 4 MG/2ML IJ SOLN
4.0000 mg | Freq: Four times a day (QID) | INTRAMUSCULAR | Status: DC | PRN
  Administered 2012-01-06 – 2012-01-07 (×3): 4 mg via INTRAVENOUS
  Filled 2012-01-06 (×3): qty 2

## 2012-01-06 MED ORDER — SODIUM CHLORIDE 0.9 % IV SOLN: INTRAVENOUS | Status: DC

## 2012-01-06 MED ORDER — CEFAZOLIN SODIUM-DEXTROSE 2-3 GM-% IV SOLR
INTRAVENOUS | Status: AC
  Filled 2012-01-06: qty 50

## 2012-01-06 MED ORDER — DEXAMETHASONE SODIUM PHOSPHATE 4 MG/ML IJ SOLN
4.0000 mg | Freq: Once | INTRAMUSCULAR | Status: AC
  Administered 2012-01-06: 4 mg via INTRAVENOUS

## 2012-01-06 MED ORDER — ONDANSETRON HCL 4 MG/2ML IJ SOLN
INTRAMUSCULAR | Status: AC
  Administered 2012-01-06: 4 mg via INTRAVENOUS
  Filled 2012-01-06: qty 2

## 2012-01-06 SURGICAL SUPPLY — 41 items
APL SKNCLS STERI-STRIP NONHPOA (GAUZE/BANDAGES/DRESSINGS) ×1
APPLIER CLIP UNV 5X34 EPIX (ENDOMECHANICALS) ×2 IMPLANT
APR XCLPCLP 20M/L UNV 34X5 (ENDOMECHANICALS) ×1
BAG HAMPER (MISCELLANEOUS) ×2 IMPLANT
BAG SPEC RTRVL LRG 6X4 10 (ENDOMECHANICALS) ×1
BENZOIN TINCTURE PRP APPL 2/3 (GAUZE/BANDAGES/DRESSINGS) ×2 IMPLANT
CLOTH BEACON ORANGE TIMEOUT ST (SAFETY) ×2 IMPLANT
COVER LIGHT HANDLE STERIS (MISCELLANEOUS) ×4 IMPLANT
DECANTER SPIKE VIAL GLASS SM (MISCELLANEOUS) ×2 IMPLANT
DEVICE TROCAR PUNCTURE CLOSURE (ENDOMECHANICALS) ×2 IMPLANT
DURAPREP 26ML APPLICATOR (WOUND CARE) ×2 IMPLANT
ELECT REM PT RETURN 9FT ADLT (ELECTROSURGICAL) ×2
ELECTRODE REM PT RTRN 9FT ADLT (ELECTROSURGICAL) ×1 IMPLANT
FILTER SMOKE EVAC LAPAROSHD (FILTER) ×2 IMPLANT
FORMALIN 10 PREFIL 120ML (MISCELLANEOUS) ×2 IMPLANT
GLOVE BIOGEL PI IND STRL 7.5 (GLOVE) ×1 IMPLANT
GLOVE BIOGEL PI INDICATOR 7.5 (GLOVE) ×1
GLOVE ECLIPSE 6.5 STRL STRAW (GLOVE) ×2 IMPLANT
GLOVE ECLIPSE 7.0 STRL STRAW (GLOVE) ×4 IMPLANT
GLOVE INDICATOR 7.0 STRL GRN (GLOVE) ×4 IMPLANT
GLOVE INDICATOR 7.5 STRL GRN (GLOVE) ×2 IMPLANT
GOWN STRL REIN XL XLG (GOWN DISPOSABLE) ×6 IMPLANT
HEMOSTAT SNOW SURGICEL 2X4 (HEMOSTASIS) ×2 IMPLANT
INST SET LAPROSCOPIC AP (KITS) ×2 IMPLANT
IV NS IRRIG 3000ML ARTHROMATIC (IV SOLUTION) IMPLANT
KIT ROOM TURNOVER APOR (KITS) ×2 IMPLANT
MANIFOLD NEPTUNE II (INSTRUMENTS) ×2 IMPLANT
NEEDLE INSUFFLATION 14GA 120MM (NEEDLE) ×2 IMPLANT
PACK LAP CHOLE LZT030E (CUSTOM PROCEDURE TRAY) ×2 IMPLANT
PAD ARMBOARD 7.5X6 YLW CONV (MISCELLANEOUS) ×2 IMPLANT
POUCH SPECIMEN RETRIEVAL 10MM (ENDOMECHANICALS) ×2 IMPLANT
SET BASIN LINEN APH (SET/KITS/TRAYS/PACK) ×2 IMPLANT
SET TUBE IRRIG SUCTION NO TIP (IRRIGATION / IRRIGATOR) IMPLANT
STRIP CLOSURE SKIN 1/2X4 (GAUZE/BANDAGES/DRESSINGS) ×2 IMPLANT
SUT MNCRL AB 4-0 PS2 18 (SUTURE) ×4 IMPLANT
SUT VIC AB 2-0 CT2 27 (SUTURE) ×2 IMPLANT
TROCAR Z-THRD FIOS HNDL 11X100 (TROCAR) ×2 IMPLANT
TROCAR Z-THREAD FIOS 5X100MM (TROCAR) ×2 IMPLANT
TROCAR Z-THREAD OPTICAL 5X100M (TROCAR) ×4 IMPLANT
TROCAR Z-THREAD SLEEVE 11X100 (TROCAR) ×4 IMPLANT
WARMER LAPAROSCOPE (MISCELLANEOUS) ×2 IMPLANT

## 2012-01-06 NOTE — ED Notes (Signed)
Warm compress to area of swelling at right [facility].

## 2012-01-06 NOTE — Anesthesia Preprocedure Evaluation (Addendum)
Anesthesia Evaluation  Patient identified by MRN, date of birth, ID band Patient awake    Reviewed: Allergy & Precautions, H&P , NPO status , Patient's Chart, lab work & pertinent test results  History of Anesthesia Complications Negative for: history of anesthetic complications  Airway Mallampati: III TM Distance: >3 FB     Dental  (+) Teeth Intact   Pulmonary former smoker breath sounds clear to auscultation        Cardiovascular negative cardio ROS  Rhythm:Regular     Neuro/Psych    GI/Hepatic RUQ pain and nausea today.    Endo/Other    Renal/GU      Musculoskeletal   Abdominal   Peds  Hematology   Anesthesia Other Findings   Reproductive/Obstetrics                          Anesthesia Physical Anesthesia Plan  ASA: I  Anesthesia Plan: General   Post-op Pain Management:    Induction: Intravenous, Rapid sequence and Cricoid pressure planned  Airway Management Planned: Oral ETT  Additional Equipment:   Intra-op Plan:   Post-operative Plan: Extubation in OR  Informed Consent: I have reviewed the patients History and Physical, chart, labs and discussed the procedure including the risks, benefits and alternatives for the proposed anesthesia with the patient or authorized representative who has indicated his/her understanding and acceptance.     Plan Discussed with:   Anesthesia Plan Comments:         Anesthesia Quick Evaluation

## 2012-01-06 NOTE — ED Notes (Signed)
Right AC IV site with warm compress is now much less swollen.  States it feels much better.

## 2012-01-06 NOTE — Op Note (Signed)
Patient:  Julie Brown  DOB:  [birth year]  MRN:  [id number]   Preop Diagnosis:  Acute cholecystitis  Postop Diagnosis:  The same  Procedure:  Laparoscopic cholecystectomy  Surgeon:  Dr. Tilford Pillar  Anes:  General endotracheal, 0.5% Sensorcaine plain for local  Indications:  Patient is a 61 year old female presented to [facility] with right upper quadrant abdominal pain. Workup and evaluation was consistent for acute cholecystitis. Risks benefits alternatives a laparoscopic possible open cholecystectomy were discussed at length patient including but not limited to risk of bleeding, infection, bile leak, small bowel injury, common bile duct injury, intraoperative cardiac and pulmonary events. Patient's questions and concerns are addressed the patient consented for the planned procedure.  Procedure note:  Patient is taken to the or is placed in supine position the or table time the general anesthetic is a Optician, dispensing. Once patient was asleep she is intubated by the nurse anesthetist. At this point her abdomen is prepped with DuraPrep solution and draped in standard fashion. A stab incision was created super umbilical with 11 blade scalpel. Additional dissection down to subcuticular tissues carried out using a Coker clamp was utilized grasp the anterior bowel fashion with this anteriorly. A Veress needle is inserted saline drop test is utilized confirm intraperitoneal placement the pneumoperitoneum was initiated. Once sufficient pneumoperitoneum was attained an 11 mm insert overlap scope allowing visualization the trocar entering into the peritoneal cavity. At this point the inner cannulas removed lap strips reinserted there is no evidence a trocar peritoneal placement injury. At this time the remaining trochars placed the 5 monitored in the epigastrium, 5 monitored in the midline, and a 5 mm in the right lateral bowel wall. Patient's placed into a reverse Trendelenburg left lateral decubitus  position. The fundus of the gallbladder is gasless up and over the right lobe liver. Blunt peritoneal dissection is carried out using a [place] to strip the peritoneal reflection off the infundibulum exposing both the cystic duct and cystic arteries and her into the infundibulum. A window was created behind both the structures. 3 endoclips placed proximally on the cystic duct one distally and cystic ducts 522 most distal clips. Similarly the cystic artery is ligated with 2 endoclips proximally one distally and the cystic arteries divided between 2 most distal clips. At this time I cautery utilized dissect the gallbladder free from the gallbladder fossa. Once it is free is placed in Endo Catch bag. To facilitate this a 5 mm scope is exchanged the 10 mm scope. The Endo Catch bag was placed into the right lower quadrant. Inspection the gallbladder fossa indicate some raw nature of the gallbladder fossa no evidence of active bleeding. The endoclips were excellent position there is no evidence of any bleeding or bile leak. Due to the raw nature of the gallbladder fossa I did opt to place a piece of Surgicel snow into the gallbladder fossa. With this in place attention was turned to closure.  Using Endo Close suture passing device a 2-0 Vicryl sutures passed to the umbilical trocar site. With this suture and placed the gallbladder was retrieved was removed through the umbilical trocar site and intact Endo Catch bag. The gallbladder is placed in the back table sent as a perm specimen to pathology. The pneumoperitoneum was evacuated. Trochars removed. The Vicryl sutures secured. Local anesthetic is instilled. A 4-0 Monocryl utilized reapproximate skin edges at all 4 trocar sites. The skin was washed dried moist dry towel. Benzoin is applied around incision. Half-inch are  suture placed. Additionally patient left come out of general static and stretcher back to the PACU in stable condition. At the conclusion of  procedure all instrument, sponge, needle counts are correct. Patient tolerated procedure extremely well.  Complications:  None  EBL:  Minimal  Specimen:  Gallbladder

## 2012-01-06 NOTE — Anesthesia Postprocedure Evaluation (Signed)
Anesthesia Post Note  Patient: Julie Brown  Procedure(s) Performed: Procedure(s) (LRB): LAPAROSCOPIC CHOLECYSTECTOMY (N/A)  Anesthesia type: General  Patient location: PACU  Post pain: Pain level controlled  Post assessment: Post-op Vital signs reviewed, Patient's Cardiovascular Status Stable, Respiratory Function Stable, Patent Airway, No signs of Nausea or vomiting and Pain level controlled  Last Vitals:  Filed Vitals:   01/06/12 1319  BP: 155/88  Pulse: 67  Temp: 36.7 C  Resp: 19    Post vital signs: Reviewed and stable  Level of consciousness: awake and alert   Complications: No apparent anesthesia complications

## 2012-01-06 NOTE — ED Notes (Signed)
Abdominal pain started around 2 am. Hurting in mid right abdomen and down my back per pt. Had 5 episodes of diarrhea since 2 am per pt.

## 2012-01-06 NOTE — ED Notes (Signed)
Patient has complained twice about pain at [facility] site - checked x 2 with immediate blood return.  Flows well.  On recheck noted swelling above the site.  IV removed and restarted in left hand.  Patient states feels much better

## 2012-01-06 NOTE — H&P (Signed)
Julie Brown is an 61 y.o. female.   Chief Complaint: Right upper quadrant abdominal pain. HPI: Patient presented to [facility] emergency department with less than 24 hours of right upper quadrant abdominal pain. No significant symptoms in the past although does have bloating and gas with fatty greasy foods. She did have side dressing on a sallow last night prior to the onset of symptoms. Pain is in the right upper quadrant. No significant radiation. He does describe as colicky. She has had some slight diarrhea. No significant changes otherwise with her bowel movements. No melena or hematochezia. No change with urination. No fevers or chills. She has had associated nausea but no emesis. No history of jaundice. No significant family history of biliary disease. She's had one previous pregnancy. She does have Native American ancestry.  Past Medical History  Diagnosis Date  . Hayfever     Past Surgical History  Procedure Date  . Tubal ligation   . Foot surgery     left foot  . Breast surgery     lump removed    History reviewed. No pertinent family history. Social History:  reports that she has quit smoking. She does not have any smokeless tobacco history on file. She reports that she drinks alcohol. She reports that she does not use illicit drugs.  Allergies:  Allergies  Allergen Reactions  . Codeine Nausea And Vomiting    Medications Prior to Admission  Medication Sig Dispense Refill  . cetirizine (ZYRTEC) 10 MG tablet Take 10 mg by mouth daily as needed.      Marland Kitchen ibuprofen (ADVIL,MOTRIN) 200 MG tablet Take 800 mg by mouth every 8 (eight) hours as needed. For pain        Results for orders placed during the hospital encounter of 01/06/12 (from the past 48 hour(s))  CBC WITH DIFFERENTIAL     Status: Abnormal   Collection Time   01/06/12  5:20 AM      Component Value Range Comment   WBC 8.4  4.0 - 10.5 K/uL    RBC 4.68  3.87 - 5.11 MIL/uL    Hemoglobin 15.1 (*) 12.0 - 15.0 g/dL    HCT 16.1  09.6 - 04.5 %    MCV 91.5  78.0 - 100.0 fL    MCH 32.3  26.0 - 34.0 pg    MCHC 35.3  30.0 - 36.0 g/dL    RDW 40.9  81.1 - 91.4 %    Platelets 215  150 - 400 K/uL    Neutrophils Relative 65  43 - 77 %    Neutro Abs 5.5  1.7 - 7.7 K/uL    Lymphocytes Relative 23  12 - 46 %    Lymphs Abs 1.9  0.7 - 4.0 K/uL    Monocytes Relative 11  3 - 12 %    Monocytes Absolute 0.9  0.1 - 1.0 K/uL    Eosinophils Relative 2  0 - 5 %    Eosinophils Absolute 0.1  0.0 - 0.7 K/uL    Basophils Relative 0  0 - 1 %    Basophils Absolute 0.0  0.0 - 0.1 K/uL   COMPREHENSIVE METABOLIC PANEL     Status: Abnormal   Collection Time   01/06/12  5:20 AM      Component Value Range Comment   Sodium 139  135 - 145 mEq/L    Potassium 3.7  3.5 - 5.1 mEq/L    Chloride 101  96 - 112 mEq/L  CO2 27  19 - 32 mEq/L    Glucose, Bld 118 (*) 70 - 99 mg/dL    BUN 19  6 - 23 mg/dL    Creatinine, Ser 2.13  0.50 - 1.10 mg/dL    Calcium 08.6  8.4 - 10.5 mg/dL    Total Protein 7.1  6.0 - 8.3 g/dL    Albumin 4.0  3.5 - 5.2 g/dL    AST 30  0 - 37 U/L    ALT 36 (*) 0 - 35 U/L    Alkaline Phosphatase 107  39 - 117 U/L    Total Bilirubin 0.5  0.3 - 1.2 mg/dL    GFR calc non Af Amer 62 (*) >90 mL/min    GFR calc Af Amer 72 (*) >90 mL/min   LIPASE, BLOOD     Status: Normal   Collection Time   01/06/12  5:20 AM      Component Value Range Comment   Lipase 24  11 - 59 U/L    US Abdomen Limited Ruq  01/06/2012  *RADIOLOGY REPORT*  Clinical Data:  Right upper quadrant abdominal pain.  LIMITED ABDOMINAL ULTRASOUND - RIGHT UPPER QUADRANT  Comparison:  No priors.  Findings:  Gallbladder:  Multiple mobile nonshadowing foci within the lumen of the gallbladder, largest of which measures approximately 1.2 cm in diameter, likely to represent large sludge balls.  Gallbladder is moderately distended, but the wall appears mildly thickened measuring 4 mm.  Trace amount of pericholecystic fluid.  Per report from the sonographer, the patient did  exhibit a sonographic Murphy's sign on examination.  Common bile duct:  Normal caliber measuring 3.5 mm.  Liver:  Diffusely increased attenuation throughout the hepatic parenchyma, suggestive of hepatic steatosis. No focal cystic or solid hepatic lesions.  Normal hepatopetal flow within the portal vein.  IMPRESSION: 1.  Findings are most consistent with multiple sludge balls within the gallbladder neck, with evidence to suggest early cholecystitis, as detailed above. 2. Normal caliber common bile duct. 3.  Hepatic steatosis.                   Original Report Authenticated By: Florencia Reasons, M.D.    Review of Systems  Constitutional: Positive for chills. Negative for fever, weight loss, malaise/fatigue and diaphoresis.  HENT: Negative.   Eyes: Negative.   Respiratory: Negative.   Cardiovascular: Negative.   Gastrointestinal: Positive for heartburn, nausea, abdominal pain (RUQ) and diarrhea. Negative for vomiting, constipation, blood in stool and melena.  Genitourinary: Negative.   Musculoskeletal: Negative.   Skin: Negative.   Neurological: Negative.  Negative for weakness.  Endo/Heme/Allergies: Negative.   Psychiatric/Behavioral: Negative.     Blood pressure 123/75, pulse 67, temperature 97.7 F (36.5 C), temperature source Oral, resp. rate 19, height 5\' 4"  (1.626 m), weight 93.895 kg (207 lb), SpO2 96.00%. Physical Exam  Constitutional: She is oriented to person, place, and time. She appears well-developed and well-nourished. No distress.       obese  HENT:  Head: Normocephalic and atraumatic.  Eyes: Conjunctivae are normal. Pupils are equal, round, and reactive to light. No scleral icterus.  Neck: Normal range of motion. Neck supple. No tracheal deviation present. No thyromegaly present.  Cardiovascular: Normal rate and normal heart sounds.   Respiratory: Effort normal and breath sounds normal. No respiratory distress.  GI: Soft. Bowel sounds are normal. She exhibits no distension  (RUQ.  +Murphy's sign) and no mass. There is tenderness. There is no rebound and no guarding.  Musculoskeletal: Normal range of motion.  Lymphadenopathy:    She has no cervical adenopathy.  Neurological: She is alert and oriented to person, place, and time.  Skin: Skin is warm and dry.     Assessment/Plan Acute cholecystitis. Risks benefits alternatives a laparoscopic possible open cholecystectomy were discussed at length the patient and family. Her questions and concerns are addressed the patient will be consented for the planned procedure. In the meantime patient will be continued n.p.o. status. Continue IV fluid hydration. Continued on DVT prophylaxis. Prophylactic antibiotic coverage will be initiated.  Kaye Mitro C 01/06/2012, 11:12 AM

## 2012-01-06 NOTE — Addendum Note (Signed)
Addendum  created 01/06/12 1329 by Franco Nones, CRNA   Modules edited:Anesthesia Flowsheet

## 2012-01-06 NOTE — Anesthesia Procedure Notes (Signed)
Procedure Name: Intubation Date/Time: 01/06/2012 12:27 PM Performed by: Franco Nones Pre-anesthesia Checklist: Patient identified, Patient being monitored, Timeout performed, Emergency Drugs available and Suction available Patient Re-evaluated:Patient Re-evaluated prior to inductionOxygen Delivery Method: Circle System Utilized Preoxygenation: Pre-oxygenation with 100% oxygen Intubation Type: IV induction, Rapid sequence and Cricoid Pressure applied Ventilation: Mask ventilation without difficulty Laryngoscope Size: Miller and 2 Grade View: Grade II Tube type: Oral Tube size: 7.0 mm Number of attempts: 3 Airway Equipment and Method: stylet,  Video-laryngoscopy and Stylet Placement Confirmation: ETT inserted through vocal cords under direct vision,  positive ETCO2 and breath sounds checked- equal and bilateral Secured at: 20 cm Tube secured with: Tape Dental Injury: Teeth and Oropharynx as per pre-operative assessment and Injury to tongue  Difficulty Due To: Difficulty was anticipated, Difficult Airway- due to limited oral opening, Difficult Airway- due to reduced neck mobility and Difficult Airway- due to large tongue Comments: Poor view with Miller 2. Glidescope in room. DL with glidescope #3.  Unable to intubate.  Pt ventilated with 8 oral airway.  DL with Glidescope 3. Intubated . Small laceration noted anterior  right  Tongue. Ice applied

## 2012-01-06 NOTE — Plan of Care (Signed)
Problem: Diagnosis - Type of Surgery Goal: General Surgical Patient Education (See Patient Education module for education specifics) Acute cholecystitis

## 2012-01-06 NOTE — ED Provider Notes (Signed)
History     CSN: [id number]  Arrival date & time 01/06/12  0453   First MD Initiated Contact with Patient 01/06/12 0510      Chief Complaint  Patient presents with  . Abdominal Pain    (Consider location/radiation/quality/duration/timing/severity/associated sxs/prior treatment) HPI Is a 61 year old white female with a 3 hour history of right upper quadrant abdominal pain, radiating to her right flank. The pain is moderate to severe, worse with palpation or movement. There is been associated nausea but no vomiting. She's had multiple episodes of diarrhea. She still has her gallbladder. She has no history of gallbladder attacks in the past.  Past Medical History  Diagnosis Date  . Hayfever     Past Surgical History  Procedure Date  . Tubal ligation   . Foot surgery     left foot  . Breast surgery     lump removed    History reviewed. No pertinent family history.  History  Substance Use Topics  . Smoking status: Former Games developer  . Smokeless tobacco: Not on file  . Alcohol Use: Yes     occasional    OB History    Grav Para Term Preterm Abortions TAB SAB Ect Mult Living                  Review of Systems  All other systems reviewed and are negative.    Allergies  Codeine  Home Medications   Current Outpatient Rx  Name Route Sig Dispense Refill  . CETIRIZINE HCL 10 MG PO TABS Oral Take 10 mg by mouth daily as needed.    . IBUPROFEN 200 MG PO TABS Oral Take 800 mg by mouth every 8 (eight) hours as needed. For pain      BP 135/84  Pulse 78  Temp 97.7 F (36.5 C) (Oral)  Resp 18  Ht 5\' 4"  (1.626 m)  Wt 207 lb (93.895 kg)  BMI 35.53 kg/m2  SpO2 98%  Physical Exam General: Well-developed, well-nourished female in no acute distress; appearance consistent with age of record; appears uncomfortable HENT: normocephalic, atraumatic Eyes: pupils equal round and reactive to light; extraocular muscles intact Neck: supple Heart: regular rate and rhythm Lungs:  clear to auscultation bilaterally Abdomen: soft; nondistended; severe right upper quadrant tenderness; no masses or hepatosplenomegaly; bowel sounds present; gallstones seen on bedside ultrasound with positive Murphy sign Extremities: No deformity; full range of motion Neurologic: Awake, alert and oriented; motor function intact in all extremities and symmetric; no facial droop Skin: Warm and dry     ED Course  Procedures (including critical care time)    MDM   Nursing notes and vitals signs, including pulse oximetry, reviewed.  Summary of this visit's results, reviewed by myself:  Labs:  Results for orders placed during the hospital encounter of 01/06/12  CBC WITH DIFFERENTIAL      Component Value Range   WBC 8.4  4.0 - 10.5 K/uL   RBC 4.68  3.87 - 5.11 MIL/uL   Hemoglobin 15.1 (*) 12.0 - 15.0 g/dL   HCT 40.9  81.1 - 91.4 %   MCV 91.5  78.0 - 100.0 fL   MCH 32.3  26.0 - 34.0 pg   MCHC 35.3  30.0 - 36.0 g/dL   RDW 78.2  95.6 - 21.3 %   Platelets 215  150 - 400 K/uL   Neutrophils Relative 65  43 - 77 %   Neutro Abs 5.5  1.7 - 7.7 K/uL   Lymphocytes Relative  23  12 - 46 %   Lymphs Abs 1.9  0.7 - 4.0 K/uL   Monocytes Relative 11  3 - 12 %   Monocytes Absolute 0.9  0.1 - 1.0 K/uL   Eosinophils Relative 2  0 - 5 %   Eosinophils Absolute 0.1  0.0 - 0.7 K/uL   Basophils Relative 0  0 - 1 %   Basophils Absolute 0.0  0.0 - 0.1 K/uL  COMPREHENSIVE METABOLIC PANEL      Component Value Range   Sodium 139  135 - 145 mEq/L   Potassium 3.7  3.5 - 5.1 mEq/L   Chloride 101  96 - 112 mEq/L   CO2 27  19 - 32 mEq/L   Glucose, Bld 118 (*) 70 - 99 mg/dL   BUN 19  6 - 23 mg/dL   Creatinine, Ser 1.91  0.50 - 1.10 mg/dL   Calcium 47.8  8.4 - 29.5 mg/dL   Total Protein 7.1  6.0 - 8.3 g/dL   Albumin 4.0  3.5 - 5.2 g/dL   AST 30  0 - 37 U/L   ALT 36 (*) 0 - 35 U/L   Alkaline Phosphatase 107  39 - 117 U/L   Total Bilirubin 0.5  0.3 - 1.2 mg/dL   GFR calc non Af Amer 62 (*) >90 mL/min    GFR calc Af Amer 72 (*) >90 mL/min  LIPASE, BLOOD      Component Value Range   Lipase 24  11 - 59 U/L    Imaging Studies: US Abdomen Limited Ruq  01-25-2012  *RADIOLOGY REPORT*  Clinical Data:  Right upper quadrant abdominal pain.  LIMITED ABDOMINAL ULTRASOUND - RIGHT UPPER QUADRANT  Comparison:  No priors.  Findings:  Gallbladder:  Multiple mobile nonshadowing foci within the lumen of the gallbladder, largest of which measures approximately 1.2 cm in diameter, likely to represent large sludge balls.  Gallbladder is moderately distended, but the wall appears mildly thickened measuring 4 mm.  Trace amount of pericholecystic fluid.  Per report from the sonographer, the patient did exhibit a sonographic Murphy's sign on examination.  Common bile duct:  Normal caliber measuring 3.5 mm.  Liver:  Diffusely increased attenuation throughout the hepatic parenchyma, suggestive of hepatic steatosis. No focal cystic or solid hepatic lesions.  Normal hepatopetal flow within the portal vein.  IMPRESSION: 1.  Findings are most consistent with multiple sludge balls within the gallbladder neck, with evidence to suggest early cholecystitis, as detailed above. 2. Normal caliber common bile duct. 3.  Hepatic steatosis.                   Original Report Authenticated By: Florencia Reasons, M.D.   Dr. Leticia Penna to see.          Hanley Seamen, MD 2012-01-25 [phone]

## 2012-01-06 NOTE — ED Notes (Signed)
Temporary admission orders received from Dr. Leticia Penna

## 2012-01-06 NOTE — ED Notes (Signed)
Per Dr. Read Drivers- Dr. Leticia Penna to come in to see pt.

## 2012-01-06 NOTE — Transfer of Care (Signed)
Immediate Anesthesia Transfer of Care Note  Patient: Julie Brown  Procedure(s) Performed: Procedure(s) (LRB): LAPAROSCOPIC CHOLECYSTECTOMY (N/A)  Patient Location: PACU  Anesthesia Type: General  Level of Consciousness: awake  Airway & Oxygen Therapy: Patient Spontanous Breathing and non-rebreather face mask  Post-op Assessment: Report given to PACU RN, Post -op Vital signs reviewed and stable and Patient moving all extremities  Post vital signs: Reviewed and stable  Complications: No apparent anesthesia complications

## 2012-01-07 MED ORDER — ACETAMINOPHEN 325 MG PO TABS
650.0000 mg | ORAL_TABLET | Freq: Four times a day (QID) | ORAL | Status: DC | PRN
  Administered 2012-01-07: 650 mg via ORAL
  Filled 2012-01-07: qty 2

## 2012-01-07 MED ORDER — HYDROCODONE-ACETAMINOPHEN 5-325 MG PO TABS: 1.0000 | ORAL_TABLET | ORAL | Status: AC | PRN

## 2012-01-07 NOTE — Progress Notes (Signed)
UR Chart Review Completed  

## 2012-01-07 NOTE — Progress Notes (Signed)
1 Day Post-Op  Subjective: Some nausea. Pain mostly controlled. Poor by mouth intake at this time.  Objective: Vital signs in last 24 hours: Temp:  [96.8 F (36 C)-98.3 F (36.8 C)] 98.1 F (36.7 C) (07/04 0428) Pulse Rate:  [68-104] 93  (07/04 0428) Resp:  [14-19] 16  (07/04 0428) BP: (116-146)/(69-84) 117/69 mmHg (07/04 0428) SpO2:  [94 %-100 %] 94 % (07/04 0428) Last BM Date: 01/06/12  Intake/Output from previous day: 07/03 0701 - 07/04 0700 In: 1100 [I.V.:1100] Out: 10 [Blood:10] Intake/Output this shift: Total I/O In: 400 [P.O.:400] Out: -   General appearance: alert and no distress GI: Positive bowel sounds, soft, expected right upper quadrant tenderness. Incisions are clean dry and intact. Steri-Strips are in place. No peritoneal signs.  Lab Results:   [facility] 01/06/12 0520  WBC 8.4  HGB 15.1*  HCT 42.8  PLT 215   BMET  Basename 01/06/12 0520  NA 139  K 3.7  CL 101  CO2 27  GLUCOSE 118*  BUN 19  CREATININE 0.98  CALCIUM 10.4   PT/INR No results found for this basename: LABPROT:2,INR:2 in the last 72 hours ABG No results found for this basename: PHART:2,PCO2:2,PO2:2,HCO3:2 in the last 72 hours  Studies/Results: US Abdomen Limited Ruq  01/06/2012  *RADIOLOGY REPORT*  Clinical Data:  Right upper quadrant abdominal pain.  LIMITED ABDOMINAL ULTRASOUND - RIGHT UPPER QUADRANT  Comparison:  No priors.  Findings:  Gallbladder:  Multiple mobile nonshadowing foci within the lumen of the gallbladder, largest of which measures approximately 1.2 cm in diameter, likely to represent large sludge balls.  Gallbladder is moderately distended, but the wall appears mildly thickened measuring 4 mm.  Trace amount of pericholecystic fluid.  Per report from the sonographer, the patient did exhibit a sonographic Murphy's sign on examination.  Common bile duct:  Normal caliber measuring 3.5 mm.  Liver:  Diffusely increased attenuation throughout the hepatic parenchyma, suggestive  of hepatic steatosis. No focal cystic or solid hepatic lesions.  Normal hepatopetal flow within the portal vein.  IMPRESSION: 1.  Findings are most consistent with multiple sludge balls within the gallbladder neck, with evidence to suggest early cholecystitis, as detailed above. 2. Normal caliber common bile duct. 3.  Hepatic steatosis.                   Original Report Authenticated By: Florencia Reasons, M.D.    Anti-infectives: Anti-infectives     Start     Dose/Rate Route Frequency Ordered Stop   01/06/12 1204   ceFAZolin (ANCEF) 1-5 GM-% IVPB     Comments: TICKLE, STACEY: cabinet override         01/06/12 1204 01/07/12 0014   01/06/12 1146   ceFAZolin (ANCEF) 2-3 GM-% IVPB SOLR     Comments: MOORE, KIMBERLY: cabinet override         01/06/12 1146 01/06/12 2359   01/06/12 1139   ceFAZolin (ANCEF) IVPB 2 g/50 mL premix  Status:  Discontinued        2 g 100 mL/hr over 30 Minutes Intravenous 60 min pre-op 01/06/12 1139 01/06/12 1210          Assessment/Plan: s/p Procedure(s) (LRB): LAPAROSCOPIC CHOLECYSTECTOMY (N/A) Overall patient is doing fairly well on postoperative day one. We'll continue to advance her diet slowly as tolerated however at this time I do think she still having some effects from anesthesia. Clinically patient remained stable we'll continue to monitor the patient closely to the day. It is possible that  if she continues to demonstrate improvement that she may feel to be discharged later today however given her acute nature of her cholecystitis I do feel she may still warn 24-48 hours of continued hospital monitoring.  LOS: 1 day    Julie Brown C 01/07/2012

## 2012-01-07 NOTE — Progress Notes (Signed)
Patient received discharge instructions along with follow up appointments and prescriptions. Patient verbalized understanding of all instructions. Patient was escorted by staff via wheelchair to vehicle. Patient discharged to home in stable condition. 

## 2012-01-10 NOTE — Anesthesia Postprocedure Evaluation (Signed)
  Anesthesia Post-op Note  Patient: Julie Brown  Procedure(s) Performed: Procedure(s) (LRB): LAPAROSCOPIC CHOLECYSTECTOMY (N/A)  Patient Location: 306 A  Anesthesia Type: General  Level of Consciousness: awake, alert , oriented and patient cooperative  Airway and Oxygen Therapy: Patient Spontanous Breathing  Post-op Pain: none  Post-op Assessment: Post-op Vital signs reviewed, Patient's Cardiovascular Status Stable, Respiratory Function Stable, Patent Airway and Pain level controlled  Post-op Vital Signs: Reviewed and stable  Complications: No apparent anesthesia complications

## 2012-01-10 NOTE — Addendum Note (Signed)
Addendum  created 01/10/12 1411 by Despina Hidden, CRNA   Modules edited:Notes Section

## 2012-01-11 ENCOUNTER — Encounter (HOSPITAL_COMMUNITY): Payer: Self-pay | Admitting: General Surgery

## 2012-02-03 NOTE — Discharge Summary (Signed)
Physician Discharge Summary  Patient ID: Julie Brown MRN: [id number] DOB/AGE: 11-01-1950 61 y.o.  Admit date: 01/06/2012 Discharge date: 01/07/2012  Admission Diagnoses: Acute cholecystitis  Discharge Diagnoses: The same Active Problems:  * No active hospital problems. *    Discharged Condition: stable  Hospital Course: Patient presented to [facility] with right upper quadrant abdominal pain. Workup and evaluation was suspicious for acute cholecystitis. Risks benefits alternatives a laparoscopic possible open cholecystectomy were discussed at length the patient. She was taken to the operating room on the day of admission and underwent a successful laparoscopic cholecystectomy. She was watched overnight. The following morning patient did have significant nausea. She was slowly advanced in her diet as tolerated. Later that afternoon her symptomatology improved. She was tolerating regular diet. Pain is controlled oral analgesia. Patient was made ready for discharge.  Consults: None  Significant Diagnostic Studies: labs: CBC, CMET and radiology: CT scan: Abdomen and pelvis  Treatments: IV hydration and surgery: Laparoscopic cholecystectomy  Discharge Exam: Blood pressure 125/77, pulse 73, temperature 97.8 F (36.6 C), temperature source Oral, resp. rate 18, height 5\' 4"  (1.626 m), weight 93.895 kg (207 lb), SpO2 94.00%. General appearance: alert and no distress Resp: clear to auscultation bilaterally Cardio: regular rate and rhythm GI: Positive bowel sounds, soft, expected postoperative tenderness. Incisions are clean dry and intact. No peritoneal signs no hernias.  Disposition: 01-Home or Self Care  Discharge Orders    Future Orders Please Complete By Expires   Diet - low sodium heart healthy      Increase activity slowly      Discharge instructions      Comments:   Increase activity as tolerated. May place ice pack for comfort.  Alternate an anti-inflammatory such as  ibuprofen (Motrin, Advil) 400-600mg  every 6 hours with the prescribed pain medication.   Do not take any additional acetaminophen as there is Tylenol in the pain medication.   Driving Restrictions      Comments:   No driving while on pain medications.   Lifting restrictions      Comments:   No lifting over 20lbs for 4-5 weeks post-op.   Discharge wound care:      Comments:   Clean surgical sites with soap and water.  May shower the morning after surgery unless instructed by Dr. Leticia Penna otherwise.  No soaking for 2-3 weeks.    If adhesive strips are in place, they may be removed in 1-2 weeks while in the shower.   Call MD for:  temperature >100.4      Call MD for:  persistant nausea and vomiting      Call MD for:  severe uncontrolled pain      Call MD for:  redness, tenderness, or signs of infection (pain, swelling, redness, odor or green/yellow discharge around incision site)        Medication List  As of 02/03/2012  9:55 AM   TAKE these medications         cetirizine 10 MG tablet   Commonly known as: ZYRTEC   Take 10 mg by mouth daily as needed.      ibuprofen 200 MG tablet   Commonly known as: ADVIL,MOTRIN   Take 800 mg by mouth every 8 (eight) hours as needed. For pain             Signed: [name] C 02/03/2012, 9:55 AM

## 2012-11-25 ENCOUNTER — Encounter: Payer: Self-pay | Admitting: *Deleted

## 2012-11-30 ENCOUNTER — Encounter: Payer: Self-pay | Admitting: Nurse Practitioner

## 2013-05-24 ENCOUNTER — Ambulatory Visit (INDEPENDENT_AMBULATORY_CARE_PROVIDER_SITE_OTHER): Payer: Self-pay | Admitting: Family Medicine

## 2013-05-24 ENCOUNTER — Encounter: Payer: Self-pay | Admitting: Family Medicine

## 2013-05-24 VITALS — BP 132/76 | Temp 98.1°F | Ht 64.0 in | Wt 219.8 lb

## 2013-05-24 DIAGNOSIS — J019 Acute sinusitis, unspecified: Secondary | ICD-10-CM

## 2013-05-24 MED ORDER — SULFAMETHOXAZOLE-TMP DS 800-160 MG PO TABS: 1.0000 | ORAL_TABLET | Freq: Two times a day (BID) | ORAL | Status: DC

## 2013-05-24 NOTE — Progress Notes (Signed)
  Subjective:    Patient ID: Julie Brown, female    DOB: [birth year], 62 y.o.   MRN: [id number]  Sinusitis This is a new problem. The current episode started in the past 7 days. There has been no fever. Associated symptoms include congestion, coughing, ear pain, headaches, a hoarse voice and sinus pressure. Pertinent negatives include no shortness of breath. Past treatments include oral decongestants. The treatment provided mild relief.   PMH benign   Review of Systems  Constitutional: Negative for fever and activity change.  HENT: Positive for congestion, ear pain, hoarse voice, rhinorrhea and sinus pressure.   Eyes: Negative for discharge.  Respiratory: Positive for cough. Negative for shortness of breath and wheezing.   Cardiovascular: Negative for chest pain.  Neurological: Positive for headaches.       Objective:   Physical Exam  Nursing note and vitals reviewed. Constitutional: She appears well-developed.  HENT:  Head: Normocephalic.  Nose: Nose normal.  Mouth/Throat: Oropharynx is clear and moist. No oropharyngeal exudate.  Moderate sinus pain frontal  Neck: Neck supple.  Cardiovascular: Normal rate and normal heart sounds.   No murmur heard. Pulmonary/Chest: Effort normal and breath sounds normal. She has no wheezes.  Lymphadenopathy:    She has no cervical adenopathy.  Skin: Skin is warm and dry.          Assessment & Plan:  Sinusitis-antibiotics prescribed at this dose and get her better she is to notify us and we will refill medication or call in a separate medication Wellness exam recommended patient doesn't have insurance currently which is affecting her situation Patient is aware of the necessity to do this.

## 2013-05-30 ENCOUNTER — Telehealth: Payer: Self-pay | Admitting: Family Medicine

## 2013-05-30 MED ORDER — LEVOFLOXACIN 500 MG PO TABS: 500.0000 mg | ORAL_TABLET | Freq: Every day | ORAL | Status: AC

## 2013-05-30 NOTE — Telephone Encounter (Signed)
Discontinue Bactrim DS. May use Levaquin 500 milligram 1 daily for 10 days. If ongoing troubles followup office visit.

## 2013-05-30 NOTE — Telephone Encounter (Signed)
Pt is still not feeling well, cough, congestion, headache, loss of voice, diarrhea (thinks from antibiotics) can we call her in something else   Julie Brown

## 2013-05-30 NOTE — Telephone Encounter (Signed)
Seen 05/24/13 for sinuses

## 2013-05-30 NOTE — Telephone Encounter (Signed)
Rx sent electronically to [facility]. Patient notified. 

## 2013-05-30 NOTE — Telephone Encounter (Signed)
Error

## 2014-05-21 ENCOUNTER — Encounter: Payer: Self-pay | Admitting: Family Medicine

## 2014-05-21 ENCOUNTER — Ambulatory Visit (INDEPENDENT_AMBULATORY_CARE_PROVIDER_SITE_OTHER): Payer: Self-pay | Admitting: Family Medicine

## 2014-05-21 VITALS — BP 132/88 | Temp 98.3°F | Wt 231.0 lb

## 2014-05-21 DIAGNOSIS — J310 Chronic rhinitis: Secondary | ICD-10-CM

## 2014-05-21 DIAGNOSIS — J329 Chronic sinusitis, unspecified: Secondary | ICD-10-CM

## 2014-05-21 MED ORDER — ALBUTEROL SULFATE HFA 108 (90 BASE) MCG/ACT IN AERS: 2.0000 | INHALATION_SPRAY | Freq: Four times a day (QID) | RESPIRATORY_TRACT | Status: DC | PRN

## 2014-05-21 MED ORDER — LEVOFLOXACIN 500 MG PO TABS
500.0000 mg | ORAL_TABLET | Freq: Every day | ORAL | Status: AC
Start: 2014-05-21 — End: 2014-05-31

## 2014-05-21 NOTE — Progress Notes (Signed)
   Subjective:    Patient ID: Julie Brown, female    DOB: 12/19/1950, 63 y.o.   MRN: [id number]  HPI Cough and congestion and headache  Felt achey and with cough   Some runny nose  No fever  Persistent head ache  Using dayquil  Off for three wks  Pos productive and yellow gunky   Review of Systems No vomiting no diarrhea no rash    Objective:   Physical Exam   alert hydration good.slight malaise. HEENT moderate nasal congestion. Pharynx slight erythema neck supple. Lungs bronchial cough heart regular rate and rhythm.      Assessment & Plan:  Impression post viral rhinosinusitis plan antibiotics prescribed. Element of reactive airways given albuterol 2 sprays 4 times a day. Symptomatic care discussed. WSL

## 2014-06-04 ENCOUNTER — Telehealth: Payer: Self-pay | Admitting: Family Medicine

## 2014-06-04 MED ORDER — AMOXICILLIN-POT CLAVULANATE 875-125 MG PO TABS
1.0000 | ORAL_TABLET | Freq: Two times a day (BID) | ORAL | Status: AC
Start: 2014-06-04 — End: 2014-06-18

## 2014-06-04 NOTE — Telephone Encounter (Signed)
Aug 875 bid ten d 

## 2014-06-04 NOTE — Telephone Encounter (Signed)
Patient was notified med sent to pharmacy.

## 2014-06-04 NOTE — Telephone Encounter (Signed)
Patient is still having trouble speaking, cough, runny nose.  Can we call her in another round of antibiotics? She was seen 05/21/2014 for rhinosinusitis.   [facility]

## 2014-06-04 NOTE — Telephone Encounter (Signed)
Was prescribed Levaquin 500 mg for 10 days

## 2015-05-20 ENCOUNTER — Telehealth: Payer: Self-pay | Admitting: Family Medicine

## 2015-05-20 ENCOUNTER — Ambulatory Visit (INDEPENDENT_AMBULATORY_CARE_PROVIDER_SITE_OTHER): Payer: Self-pay | Admitting: Family Medicine

## 2015-05-20 VITALS — BP 138/86 | Temp 98.9°F | Ht 64.0 in | Wt 227.6 lb

## 2015-05-20 DIAGNOSIS — J329 Chronic sinusitis, unspecified: Secondary | ICD-10-CM

## 2015-05-20 DIAGNOSIS — J310 Chronic rhinitis: Secondary | ICD-10-CM

## 2015-05-20 MED ORDER — LEVOFLOXACIN 500 MG PO TABS: 500.0000 mg | ORAL_TABLET | Freq: Every day | ORAL | Status: AC

## 2015-05-20 NOTE — Progress Notes (Signed)
   Subjective:    Patient ID: Julie Brown, female    DOB: 18-Jul-1950, 64 y.o.   MRN: [id number]  Sinus Problem This is a new problem. The current episode started in the past 7 days. Associated symptoms include congestion, coughing, ear pain and a sore throat. Treatments tried: dayquil, robitussin dm,asa.   Ha frontal in nature taking asa  No bv fever  Feels chilled   Green phlegm cropping up and feeling achey ,  Rob dm prn daywquil   Review of Systems  HENT: Positive for congestion, ear pain and sore throat.   Respiratory: Positive for cough.    no vomiting no diarrhea no rash     Objective:   Physical Exam  Alert vitals stable mild malaise. HEENT moderate his congestion frontal tenderness pharynx erythematous neck supple lungs bronchial cough no crackles no wheezes heart regular rate and rhythm      Assessment & Plan:  Impression rhinosinusitis/bronchitis plan antibiotics prescribed. Symptomatic care discussed warning signs discussed WSL

## 2015-05-20 NOTE — Telephone Encounter (Signed)
Pt notified that the only med prescribed today was levaquin. Inhaler was on med list. Prescribed last year. Pt verablized understanding.

## 2015-05-20 NOTE — Telephone Encounter (Signed)
Patient says that her updated medication list on her AVS says that she was prescribed an inhaler but she only received the levofloxacin from the pharmacy.  She wants to know if the inhaler was supposed to be called in?

## 2015-05-28 ENCOUNTER — Telehealth: Payer: Self-pay | Admitting: Family Medicine

## 2015-05-28 MED ORDER — AMOXICILLIN-POT CLAVULANATE 875-125 MG PO TABS
1.0000 | ORAL_TABLET | Freq: Two times a day (BID) | ORAL | Status: AC
Start: 2015-05-28 — End: 2015-06-07

## 2015-05-28 NOTE — Telephone Encounter (Signed)
Pt seen on the 14th for rhinosinusitis  Still having congestion, head not hurting as bad   Wants to know if she can get another round of antibiotic  [facility]

## 2015-05-28 NOTE — Telephone Encounter (Signed)
Rx sent electronically to pharmacy. Patient notified. 

## 2015-05-28 NOTE — Telephone Encounter (Signed)
Aug 875 bid ten d 

## 2015-10-15 ENCOUNTER — Telehealth: Payer: Self-pay | Admitting: Family Medicine

## 2015-10-15 DIAGNOSIS — Z1211 Encounter for screening for malignant neoplasm of colon: Secondary | ICD-10-CM

## 2015-10-15 NOTE — Telephone Encounter (Signed)
Referral ordered in [facility] for screening colonoscopy

## 2015-10-15 NOTE — Telephone Encounter (Signed)
Told by whom (let me know), then brendale assist if necessary vs phone cll on part of pt

## 2015-10-15 NOTE — Telephone Encounter (Signed)
Pt wanted to get routine colonoscopy but was told to get a referral first   Can we process this please

## 2015-10-16 ENCOUNTER — Encounter: Payer: Self-pay | Admitting: Family Medicine

## 2015-10-23 ENCOUNTER — Telehealth: Payer: Self-pay

## 2015-10-23 NOTE — Telephone Encounter (Signed)
PT left Vm that she is ready to schedule a colonoscopy.

## 2015-10-25 NOTE — Telephone Encounter (Signed)
I spoke to pt and she said she was due for her next colonoscopy and she is having some problems with diarrhea and hemorrhoids.  She is SELF PAY and per Rosendo Gros, I told pt to bring at least $25.00 with her to her OV appt.   Appt with Neil Crouch, PA on 11/12/2015 at 9:30 Am.

## 2015-11-12 ENCOUNTER — Ambulatory Visit: Payer: Self-pay | Admitting: Gastroenterology

## 2015-12-03 ENCOUNTER — Encounter (INDEPENDENT_AMBULATORY_CARE_PROVIDER_SITE_OTHER): Payer: Self-pay

## 2015-12-03 ENCOUNTER — Other Ambulatory Visit: Payer: Self-pay

## 2015-12-03 ENCOUNTER — Encounter: Payer: Self-pay | Admitting: Gastroenterology

## 2015-12-03 ENCOUNTER — Ambulatory Visit (INDEPENDENT_AMBULATORY_CARE_PROVIDER_SITE_OTHER): Payer: Self-pay | Admitting: Gastroenterology

## 2015-12-03 VITALS — BP 142/100 | HR 80 | Temp 98.3°F | Ht 64.0 in | Wt 218.2 lb

## 2015-12-03 DIAGNOSIS — K625 Hemorrhage of anus and rectum: Secondary | ICD-10-CM | POA: Insufficient documentation

## 2015-12-03 DIAGNOSIS — R197 Diarrhea, unspecified: Secondary | ICD-10-CM

## 2015-12-03 DIAGNOSIS — Z86010 Personal history of colonic polyps: Secondary | ICD-10-CM

## 2015-12-03 DIAGNOSIS — Z800 Family history of malignant neoplasm of digestive organs: Secondary | ICD-10-CM

## 2015-12-03 NOTE — Progress Notes (Signed)
Primary Care Physician:  Mickie Hillier, MD  Primary Gastroenterologist:  Garfield Cornea, MD   Chief Complaint  Patient presents with  . Diarrhea  . Colonoscopy    HPI:  Julie Brown is a 65 y.o. female here For further evaluation of diarrhea and to schedule surveillance colonoscopy. Her last colonoscopy was in 2008. She had multiple polyps removed, pathology tubular adenomas. History of colon cancer, mother in her 8s or 30s.  Patient states she developed diarrhea after she underwent gallbladder surgery in 2013. Over the course of the last year, her diarrhea is worsened. Rarely has a solid stool. Associated with urgency and occasional incontinence. Over the past 1 month she has been on antibiotics for dental abscess. For the past 1 week she took clindamycin. She feels like her diarrhea has worsened, associated with nocturnal diarrhea at this point. Some bright red blood per rectum which she feels is related to anorectal irritation from the diarrhea. Occurs rarely. No significant abdominal pain. No unintentional weight loss (has lost 10 pounds since November related to dietary changes). Only has abdominal discomfort if she takes Imodium. No heartburn or vomiting.     Current Outpatient Prescriptions  Medication Sig Dispense Refill  . albuterol (PROVENTIL HFA;VENTOLIN HFA) 108 (90 BASE) MCG/ACT inhaler Inhale 2 puffs into the lungs every 6 (six) hours as needed for wheezing or shortness of breath. 1 Inhaler 2  . loperamide (IMODIUM A-D) 2 MG tablet Take 2 mg by mouth 4 (four) times daily as needed for diarrhea or loose stools.     No current facility-administered medications for this visit.    Allergies as of 12/03/2015 - Review Complete 12/03/2015  Allergen Reaction Noted  . Codeine Nausea And Vomiting   . Hydrocodone Nausea And Vomiting 11/25/2012    Past Medical History  Diagnosis Date  . Hayfever   . PONV (postoperative nausea and vomiting)   . Asthma   . Hyperlipidemia   .  Migraine headache     Past Surgical History  Procedure Laterality Date  . Tubal ligation    . Foot surgery      left foot  . Breast surgery      lump removed  . Cholecystectomy  01/06/2012    Procedure: LAPAROSCOPIC CHOLECYSTECTOMY;  Surgeon: Donato Heinz, MD;  Location: [facility];  Service: General;  Laterality: N/A;  . Colonoscopy  07/22/06    [id number] and colon polyps/left-sided diverticula. adenomatous colon polyps. surveillance TCS due 2013  . Esophagogastroduodenoscopy  07/22/06    [id number] HH otherwise normal    Family History  Problem Relation Age of Onset  . Colon cancer Mother     age 75  . Kidney cancer Mother     cause of death, 69 cancer surgeries, died at age 68    Social History   Social History  . Marital Status: Married    Spouse Name: N/A  . Number of Children: 1  . Years of Education: N/A   Occupational History  . Not on file.   Social History Main Topics  . Smoking status: Former Research scientist (life sciences)  . Smokeless tobacco: Not on file     Comment: quit 1990  . Alcohol Use: 0.0 oz/week    0 Standard drinks or equivalent per week     Comment: occasional, not daily  . Drug Use: No  . Sexual Activity: Not on file   Other Topics Concern  . Not on file   Social History Narrative      ROS:  General: Negative for anorexia, Unintentional weight loss, fever, chills, fatigue, weakness. Eyes: Negative for vision changes.  ENT: Negative for hoarseness, difficulty swallowing , nasal congestion. CV: Negative for chest pain, angina, palpitations, dyspnea on exertion, peripheral edema.  Respiratory: Negative for dyspnea at rest, dyspnea on exertion, cough, sputum, wheezing.  GI: See history of present illness. GU:  Negative for dysuria, hematuria, urinary incontinence, urinary frequency, nocturnal urination.  MS: Negative for joint pain, low back pain.  Derm: Negative for rash or itching.  Neuro: Negative for weakness, abnormal sensation, seizure, frequent  headaches, memory loss, confusion.  Psych: Negative for anxiety, depression, suicidal ideation, hallucinations.  Endo: Negative for unusual weight change.  Heme: Negative for bruising or bleeding. Allergy: Negative for rash or hives.    Physical Examination:  BP 142/100 mmHg  Pulse 80  Temp(Src) 98.3 F (36.8 C) (Oral)  Ht 5\' 4"  (1.626 m)  Wt 218 lb 3.2 oz (98.975 kg)  BMI 37.44 kg/m2   General: Well-nourished, well-developed in no acute distress.  Head: Normocephalic, atraumatic.   Eyes: Conjunctiva pink, no icterus. Mouth: Oropharyngeal mucosa moist and pink , no lesions erythema or exudate. Neck: Supple without thyromegaly, masses, or lymphadenopathy.  Lungs: Clear to auscultation bilaterally.  Heart: Regular rate and rhythm, no murmurs rubs or gallops.  Abdomen: Bowel sounds are normal, nontender, nondistended, no hepatosplenomegaly or masses, no abdominal bruits or    hernia , no rebound or guarding.   Rectal: Deferred Extremities: No lower extremity edema. No clubbing or deformities.  Neuro: Alert and oriented x 4 , grossly normal neurologically.  Skin: Warm and dry, no rash or jaundice.   Psych: Alert and cooperative, normal mood and affect.  Labs: No recent labs available.  Imaging Studies: No results found.

## 2015-12-03 NOTE — Patient Instructions (Signed)
1. Please collect stool and drop off at lab. 2. Colonoscopy as scheduled. See separate instructions.

## 2015-12-03 NOTE — Assessment & Plan Note (Signed)
65 year old female who presents for surveillance colonoscopy given personal history of adenomatous colon polyps, family history of colon cancer at an early age (mother). She was due for surveillance colonoscopy back in 2013, however this was delayed because of gallbladder surgery and lack of insurance. She has developed chronic diarrhea since cholecystectomy in 2013. Likely has an element of bile salt diarrhea. However in the past few weeks she's had worsening diarrhea in the setting of antibiotic therapy specifically clindamycin. She is at risk for C. difficile colitis.  C. difficile PCR to be done. Plan on colonoscopy in 3-4 weeks.  I have discussed the risks, alternatives, benefits with regards to but not limited to the risk of reaction to medication, bleeding, infection, perforation and the patient is agreeable to proceed. Written consent to be obtained.

## 2015-12-03 NOTE — Progress Notes (Signed)
cc'ed to pcp °

## 2015-12-04 LAB — CLOSTRIDIUM DIFFICILE BY PCR: Toxigenic C. Difficile by PCR: NOT DETECTED

## 2015-12-05 NOTE — Progress Notes (Signed)
Quick Note:  Please let patient know her cdiff was negative. TCS as planned.If patient wants to move up sooner, she can now that we know her cdiff is negative and if space is available. ______

## 2015-12-06 ENCOUNTER — Encounter: Payer: Self-pay | Admitting: Gastroenterology

## 2015-12-06 NOTE — Telephone Encounter (Signed)
Can we please switch out patient's prep to Suprep and provide a sample for her? Please see patient's email.

## 2015-12-09 ENCOUNTER — Telehealth: Payer: Self-pay | Admitting: Gastroenterology

## 2015-12-09 ENCOUNTER — Other Ambulatory Visit: Payer: Self-pay

## 2015-12-09 NOTE — Telephone Encounter (Signed)
Pt coming by to pick up new prep sample and instructions

## 2015-12-09 NOTE — Telephone Encounter (Signed)
Can we please switch out patient's prep to Suprep and provide a sample for her? Please see patient's email.

## 2015-12-25 ENCOUNTER — Encounter (HOSPITAL_COMMUNITY): Payer: Self-pay | Admitting: Anesthesiology

## 2015-12-25 ENCOUNTER — Ambulatory Visit (HOSPITAL_COMMUNITY)
Admission: RE | Admit: 2015-12-25 | Discharge: 2015-12-25 | Disposition: A | Discharge status: Home / Self Care | Payer: Self-pay | Source: Ambulatory Visit | Attending: Internal Medicine | Admitting: Internal Medicine

## 2015-12-25 ENCOUNTER — Encounter (HOSPITAL_COMMUNITY)
Admission: RE | Disposition: A | Discharge status: Home / Self Care | Payer: Self-pay | Source: Ambulatory Visit | Attending: Internal Medicine

## 2015-12-25 ENCOUNTER — Encounter (HOSPITAL_COMMUNITY): Payer: Self-pay

## 2015-12-25 DIAGNOSIS — R197 Diarrhea, unspecified: Secondary | ICD-10-CM | POA: Insufficient documentation

## 2015-12-25 DIAGNOSIS — Z86010 Personal history of colonic polyps: Secondary | ICD-10-CM | POA: Insufficient documentation

## 2015-12-25 DIAGNOSIS — E785 Hyperlipidemia, unspecified: Secondary | ICD-10-CM | POA: Insufficient documentation

## 2015-12-25 DIAGNOSIS — Z87891 Personal history of nicotine dependence: Secondary | ICD-10-CM | POA: Insufficient documentation

## 2015-12-25 DIAGNOSIS — D123 Benign neoplasm of transverse colon: Secondary | ICD-10-CM | POA: Insufficient documentation

## 2015-12-25 DIAGNOSIS — Z8051 Family history of malignant neoplasm of kidney: Secondary | ICD-10-CM | POA: Insufficient documentation

## 2015-12-25 DIAGNOSIS — Z800 Family history of malignant neoplasm of digestive organs: Secondary | ICD-10-CM | POA: Insufficient documentation

## 2015-12-25 DIAGNOSIS — K5730 Diverticulosis of large intestine without perforation or abscess without bleeding: Secondary | ICD-10-CM | POA: Insufficient documentation

## 2015-12-25 DIAGNOSIS — D124 Benign neoplasm of descending colon: Secondary | ICD-10-CM | POA: Insufficient documentation

## 2015-12-25 DIAGNOSIS — K625 Hemorrhage of anus and rectum: Secondary | ICD-10-CM | POA: Insufficient documentation

## 2015-12-25 DIAGNOSIS — J45909 Unspecified asthma, uncomplicated: Secondary | ICD-10-CM | POA: Insufficient documentation

## 2015-12-25 DIAGNOSIS — K529 Noninfective gastroenteritis and colitis, unspecified: Secondary | ICD-10-CM | POA: Insufficient documentation

## 2015-12-25 HISTORY — PX: COLONOSCOPY: SHX5424

## 2015-12-25 SURGERY — COLONOSCOPY
Anesthesia: Moderate Sedation

## 2015-12-25 MED ORDER — ONDANSETRON HCL 4 MG/2ML IJ SOLN
INTRAMUSCULAR | Status: DC | PRN
  Administered 2015-12-25: 4 mg via INTRAVENOUS

## 2015-12-25 MED ORDER — MEPERIDINE HCL 100 MG/ML IJ SOLN
INTRAMUSCULAR | Status: AC
  Filled 2015-12-25: qty 2

## 2015-12-25 MED ORDER — MEPERIDINE HCL 100 MG/ML IJ SOLN
INTRAMUSCULAR | Status: DC | PRN
  Administered 2015-12-25 (×2): 50 mg via INTRAVENOUS

## 2015-12-25 MED ORDER — MIDAZOLAM HCL 5 MG/5ML IJ SOLN
INTRAMUSCULAR | Status: AC
  Filled 2015-12-25: qty 10

## 2015-12-25 MED ORDER — MIDAZOLAM HCL 5 MG/5ML IJ SOLN
INTRAMUSCULAR | Status: DC | PRN
  Administered 2015-12-25: 2 mg via INTRAVENOUS
  Administered 2015-12-25: 1 mg via INTRAVENOUS
  Administered 2015-12-25: 2 mg via INTRAVENOUS

## 2015-12-25 MED ORDER — ONDANSETRON HCL 4 MG/2ML IJ SOLN
INTRAMUSCULAR | Status: AC
  Filled 2015-12-25: qty 2

## 2015-12-25 MED ORDER — SODIUM CHLORIDE 0.9 % IV SOLN
INTRAVENOUS | Status: DC
  Administered 2015-12-25: 07:00:00 via INTRAVENOUS

## 2015-12-25 MED ORDER — SIMETHICONE 40 MG/0.6ML PO SUSP
ORAL | Status: AC
  Filled 2015-12-25: qty 30

## 2015-12-25 NOTE — Interval H&P Note (Signed)
History and Physical Interval Note:  12/25/2015 7:36 AM  Julie Brown  has presented today for surgery, with the diagnosis of rectal bleeding/diarrhea/family history of colon cancer  The various methods of treatment have been discussed with the patient and family. After consideration of risks, benefits and other options for treatment, the patient has consented to  Procedure(s) with comments: COLONOSCOPY (N/A) - 730  as a surgical intervention .  The patient's history has been reviewed, patient examined, no change in status, stable for surgery.  I have reviewed the patient's chart and labs.  Questions were answered to the patient's satisfaction.     Keegan Ducey  C. difficile negative.  Surveillance colonoscopy with possible segmental biopsies per plan.  The risks, benefits, limitations, alternatives and imponderables have been reviewed with the patient. Questions have been answered. All parties are agreeable.

## 2015-12-25 NOTE — H&P (View-Only) (Signed)
Primary Care Physician:  Mickie Hillier, MD  Primary Gastroenterologist:  Garfield Cornea, MD   Chief Complaint  Patient presents with  . Diarrhea  . Colonoscopy    HPI:  Julie Brown is a 65 y.o. female here For further evaluation of diarrhea and to schedule surveillance colonoscopy. Her last colonoscopy was in 2008. She had multiple polyps removed, pathology tubular adenomas. History of colon cancer, mother in her 47s or 67s.  Patient states she developed diarrhea after she underwent gallbladder surgery in 2013. Over the course of the last year, her diarrhea is worsened. Rarely has a solid stool. Associated with urgency and occasional incontinence. Over the past 1 month she has been on antibiotics for dental abscess. For the past 1 week she took clindamycin. She feels like her diarrhea has worsened, associated with nocturnal diarrhea at this point. Some bright red blood per rectum which she feels is related to anorectal irritation from the diarrhea. Occurs rarely. No significant abdominal pain. No unintentional weight loss (has lost 10 pounds since November related to dietary changes). Only has abdominal discomfort if she takes Imodium. No heartburn or vomiting.     Current Outpatient Prescriptions  Medication Sig Dispense Refill  . albuterol (PROVENTIL HFA;VENTOLIN HFA) 108 (90 BASE) MCG/ACT inhaler Inhale 2 puffs into the lungs every 6 (six) hours as needed for wheezing or shortness of breath. 1 Inhaler 2  . loperamide (IMODIUM A-D) 2 MG tablet Take 2 mg by mouth 4 (four) times daily as needed for diarrhea or loose stools.     No current facility-administered medications for this visit.    Allergies as of 12/03/2015 - Review Complete 12/03/2015  Allergen Reaction Noted  . Codeine Nausea And Vomiting   . Hydrocodone Nausea And Vomiting 11/25/2012    Past Medical History  Diagnosis Date  . Hayfever   . PONV (postoperative nausea and vomiting)   . Asthma   . Hyperlipidemia   .  Migraine headache     Past Surgical History  Procedure Laterality Date  . Tubal ligation    . Foot surgery      left foot  . Breast surgery      lump removed  . Cholecystectomy  01/06/2012    Procedure: LAPAROSCOPIC CHOLECYSTECTOMY;  Surgeon: Donato Heinz, MD;  Location: [facility];  Service: General;  Laterality: N/A;  . Colonoscopy  07/22/06    [id number] and colon polyps/left-sided diverticula. adenomatous colon polyps. surveillance TCS due 2013  . Esophagogastroduodenoscopy  07/22/06    [id number] HH otherwise normal    Family History  Problem Relation Age of Onset  . Colon cancer Mother     age 3  . Kidney cancer Mother     cause of death, 84 cancer surgeries, died at age 38    Social History   Social History  . Marital Status: Married    Spouse Name: N/A  . Number of Children: 1  . Years of Education: N/A   Occupational History  . Not on file.   Social History Main Topics  . Smoking status: Former Research scientist (life sciences)  . Smokeless tobacco: Not on file     Comment: quit 1990  . Alcohol Use: 0.0 oz/week    0 Standard drinks or equivalent per week     Comment: occasional, not daily  . Drug Use: No  . Sexual Activity: Not on file   Other Topics Concern  . Not on file   Social History Narrative      ROS:  General: Negative for anorexia, Unintentional weight loss, fever, chills, fatigue, weakness. Eyes: Negative for vision changes.  ENT: Negative for hoarseness, difficulty swallowing , nasal congestion. CV: Negative for chest pain, angina, palpitations, dyspnea on exertion, peripheral edema.  Respiratory: Negative for dyspnea at rest, dyspnea on exertion, cough, sputum, wheezing.  GI: See history of present illness. GU:  Negative for dysuria, hematuria, urinary incontinence, urinary frequency, nocturnal urination.  MS: Negative for joint pain, low back pain.  Derm: Negative for rash or itching.  Neuro: Negative for weakness, abnormal sensation, seizure, frequent  headaches, memory loss, confusion.  Psych: Negative for anxiety, depression, suicidal ideation, hallucinations.  Endo: Negative for unusual weight change.  Heme: Negative for bruising or bleeding. Allergy: Negative for rash or hives.    Physical Examination:  BP 142/100 mmHg  Pulse 80  Temp(Src) 98.3 F (36.8 C) (Oral)  Ht 5\' 4"  (1.626 m)  Wt 218 lb 3.2 oz (98.975 kg)  BMI 37.44 kg/m2   General: Well-nourished, well-developed in no acute distress.  Head: Normocephalic, atraumatic.   Eyes: Conjunctiva pink, no icterus. Mouth: Oropharyngeal mucosa moist and pink , no lesions erythema or exudate. Neck: Supple without thyromegaly, masses, or lymphadenopathy.  Lungs: Clear to auscultation bilaterally.  Heart: Regular rate and rhythm, no murmurs rubs or gallops.  Abdomen: Bowel sounds are normal, nontender, nondistended, no hepatosplenomegaly or masses, no abdominal bruits or    hernia , no rebound or guarding.   Rectal: Deferred Extremities: No lower extremity edema. No clubbing or deformities.  Neuro: Alert and oriented x 4 , grossly normal neurologically.  Skin: Warm and dry, no rash or jaundice.   Psych: Alert and cooperative, normal mood and affect.  Labs: No recent labs available.  Imaging Studies: No results found.

## 2015-12-25 NOTE — Op Note (Addendum)
[facility] Patient Name: Julie Brown Procedure Date: 12/25/2015 7:38 AM MRN: [id number] Date of Birth: 1951/05/18 Attending MD: Norvel Richards , MD CSN: [id number] Age: 65 Admit Type: Outpatient Procedure:                Ileo-colonoscopy with snare polypectomy / segmental                            biopsy Indications:              High risk colon cancer surveillance: Personal                            history of colonic polyps Providers:                Norvel Richards, MD, Janeece Riggers, RN, Georgeann Oppenheim, Technician Referring MD:              Medicines:                Midazolam 5 mg IV, Meperidine 100 mg IV,                            Ondansetron 4 mg IV Complications:            No immediate complications. Estimated Blood Loss:     Estimated blood loss was minimal. Procedure:                Pre-Anesthesia Assessment:                           - Prior to the procedure, a History and Physical                            was performed, and patient medications and                            allergies were reviewed. The patient's tolerance of                            previous anesthesia was also reviewed. The risks                            and benefits of the procedure and the sedation                            options and risks were discussed with the patient.                            All questions were answered, and informed consent                            was obtained. Prior Anticoagulants: The patient has  taken no previous anticoagulant or antiplatelet                            agents. ASA Grade Assessment: II - A patient with                            mild systemic disease. After reviewing the risks                            and benefits, the patient was deemed in                            satisfactory condition to undergo the procedure.                           After obtaining informed consent,  the colonoscope                            was passed under direct vision. Throughout the                            procedure, the patient's blood pressure, pulse, and                            oxygen saturations were monitored continuously. The                            [id number] scope was introduced through                            the anus and advanced to the 5 cm into the ileum.                            The colonoscopy was performed without difficulty.                            The patient tolerated the procedure well. The                            quality of the bowel preparation was adequate. The                            terminal ileum, ileocecal valve, appendiceal                            orifice, and rectum were photographed. The entire                            colon was well visualized. Scope In: 7:50:45 AM Scope Out: 8:07:52 AM Scope Withdrawal Time: 0 hours 13 minutes 38 seconds  Total Procedure Duration: 0 hours 17 minutes 7 seconds  Findings:      The perianal and digital rectal examinations were normal.      Multiple medium-mouthed diverticula were found in  the sigmoid colon.       Multiple polyps found in the transverse and descending colon. 5 - 8       millimeters. Multiple hot and cold snare polypectomies performed.The       polyp was removed with a cold and hot snare respectively. . Estimated       blood loss was minimal. Also, segmental biopsies of the ascending and       descending/sigmoid segments taken to evaluate for chronic diarrhea. The       distal 5 cm of terminal ileum mucosa also appeared normal. Impression:               - Diverticulosis in the sigmoid colon. Multiple                            colonic polyps?"removed as described above. Status                            post segmental biopsy for chronic diarrhea. Moderate Sedation:      Moderate (conscious) sedation was administered by the endoscopy nurse       and supervised by  the endoscopist. The following parameters were       monitored: oxygen saturation, heart rate, blood pressure, respiratory       rate, EKG, adequacy of pulmonary ventilation, and response to care.       Total physician intraservice time was 27 minutes. Recommendation:           - Patient has a contact number available for                            emergencies. The signs and symptoms of potential                            delayed complications were discussed with the                            patient. Return to normal activities tomorrow.                            Written discharge instructions were provided to the                            patient.                           - Advance diet as tolerated.                           - Continue present medications.                           - Repeat colonoscopy date to be determined after                            pending pathology results are reviewed for  surveillance based on pathology results.                           - Return to GI office (date not yet determined). Procedure Code(s):        --- Professional ---                           [phone], Colonoscopy, flexible; with removal of                            tumor(s), polyp(s), or other lesion(s) by snare                            technique                           [id number], Moderate sedation services provided by the                            same physician or other qualified health care                            professional performing the diagnostic or                            therapeutic service that the sedation supports,                            requiring the presence of an independent trained                            observer to assist in the monitoring of the                            patient's level of consciousness and physiological                            status; initial 15 minutes of intraservice time,                             patient age 29 years or older                           [id number], Moderate sedation services; each additional                            15 minutes intraservice time Diagnosis Code(s):        --- Professional ---                           Z86.010, Personal history of colonic polyps                           K57.30, Diverticulosis of large intestine without  perforation or abscess without bleeding CPT copyright 2016 [employer]. All rights reserved. The codes documented in this report are preliminary and upon coder review may  be revised to meet current compliance requirements. Julie Estimable. Rourk, MD Norvel Richards, MD 12/25/2015 8:20:49 AM This report has been signed electronically. Number of Addenda: 0

## 2015-12-25 NOTE — Discharge Instructions (Signed)
Diverticulosis and colon polyp information provided  Further recommendations to follow pending review of pathology report  Colonoscopy Discharge Instructions  Read the instructions outlined below and refer to this sheet in the next few weeks. These discharge instructions provide you with general information on caring for yourself after you leave the hospital. Your doctor may also give you specific instructions. While your treatment has been planned according to the most current medical practices available, unavoidable complications occasionally occur. If you have any problems or questions after discharge, call Dr. Gala Romney at [phone]. ACTIVITY  You may resume your regular activity, but move at a slower pace for the next 24 hours.   Take frequent rest periods for the next 24 hours.   Walking will help get rid of the air and reduce the bloated feeling in your belly (abdomen).   No driving for 24 hours (because of the medicine (anesthesia) used during the test).    Do not sign any important legal documents or operate any machinery for 24 hours (because of the anesthesia used during the test).  NUTRITION  Drink plenty of fluids.   You may resume your normal diet as instructed by your doctor.   Begin with a light meal and progress to your normal diet. Heavy or fried foods are harder to digest and may make you feel sick to your stomach (nauseated).   Avoid alcoholic beverages for 24 hours or as instructed.  MEDICATIONS  You may resume your normal medications unless your doctor tells you otherwise.  WHAT YOU CAN EXPECT TODAY  Some feelings of bloating in the abdomen.   Passage of more gas than usual.   Spotting of blood in your stool or on the toilet paper.  IF YOU HAD POLYPS REMOVED DURING THE COLONOSCOPY:  No aspirin products for 7 days or as instructed.   No alcohol for 7 days or as instructed.   Eat a soft diet for the next 24 hours.  FINDING OUT THE RESULTS OF YOUR  TEST Not all test results are available during your visit. If your test results are not back during the visit, make an appointment with your caregiver to find out the results. Do not assume everything is normal if you have not heard from your caregiver or the medical facility. It is important for you to follow up on all of your test results.  SEEK IMMEDIATE MEDICAL ATTENTION IF:  You have more than a spotting of blood in your stool.   Your belly is swollen (abdominal distention).   You are nauseated or vomiting.   You have a temperature over 101.   You have abdominal pain or discomfort that is severe or gets worse throughout the day.    Diverticulosis Diverticulosis is the condition that develops when small pouches (diverticula) form in the wall of your colon. Your colon, or large intestine, is where water is absorbed and stool is formed. The pouches form when the inside layer of your colon pushes through weak spots in the outer layers of your colon. CAUSES  No one knows exactly what causes diverticulosis. RISK FACTORS  Being older than 2. Your risk for this condition increases with age. Diverticulosis is rare in people younger than 40 years. By age 61, almost everyone has it.  Eating a low-fiber diet.  Being frequently constipated.  Being overweight.  Not getting enough exercise.  Smoking.  Taking over-the-counter pain medicines, like aspirin and ibuprofen. SYMPTOMS  Most people with diverticulosis do not have symptoms. DIAGNOSIS  Because diverticulosis often has no symptoms, health care providers often discover the condition during an exam for other colon problems. In many cases, a health care provider will diagnose diverticulosis while using a flexible scope to examine the colon (colonoscopy). °TREATMENT  °If you have never developed an infection related to diverticulosis, you may not need treatment. If you have had an infection before, treatment may include: °· Eating more  fruits, vegetables, and grains. °· Taking a fiber supplement. °· Taking a live bacteria supplement (probiotic). °· Taking medicine to relax your colon. °HOME CARE INSTRUCTIONS  °· Drink at least 6-8 glasses of water each day to prevent constipation. °· Try not to strain when you have a bowel movement. °· Keep all follow-up appointments. °If you have had an infection before:  °· Increase the fiber in your diet as directed by your health care provider or dietitian. °· Take a dietary fiber supplement if your health care provider approves. °· Only take medicines as directed by your health care provider. °SEEK MEDICAL CARE IF:  °· You have abdominal pain. °· You have bloating. °· You have cramps. °· You have not gone to the bathroom in 3 days. °SEEK IMMEDIATE MEDICAL CARE IF:  °· Your pain gets worse. °· Your bloating becomes very bad. °· You have a fever or chills, and your symptoms suddenly get worse. °· You begin vomiting. °· You have bowel movements that are bloody or black. °MAKE SURE YOU: °· Understand these instructions. °· Will watch your condition. °· Will get help right away if you are not doing well or get worse. °  °This information is not intended to replace advice given to you by your health care provider. Make sure you discuss any questions you have with your health care provider. °  °Document Released: 03/19/2004 Document Revised: 06/27/2013 Document Reviewed: 05/17/2013 °Elsevier Interactive Patient Education ©2016 Else[employer] Inc[employer] °Colon Polyps °Polyps are lumps of extra tissue growing inside the body. Polyps can grow in the large intestine (colon). Most colon polyps are noncancerous (benign). However, some colon polyps can become cancerous over time. Polyps that are larger than a pea may be harmful. To be safe, caregivers remove and test all polyps. °CAUSES  °Polyps form when mutations in the genes cause your cells to grow and divide even though no more tissue is needed. °RISK FACTORS °There are a number  of risk factors that can increase your chances of getting colon polyps. They include: °· Being older than 50 years. °· Family history of colon polyps or colon cancer. °· Long-term colon diseases, such as colitis or Crohn disease. °· Being overweight. °· Smoking. °· Being inactive. °· Drinking too much alcohol. °SYMPTOMS  °Most small polyps do not cause symptoms. If symptoms are present, they may include: °· Blood in the stool. The stool may look dark red or black. °· Constipation or diarrhea that lasts longer than 1 week. °DIAGNOSIS °People often do not know they have polyps until their caregiver finds them during a regular checkup. Your caregiver can use 4 tests to check for polyps: °· Digital rectal exam. The caregiver wears gloves and feels inside the rectum. This test would find polyps only in the rectum. °· Barium enema. The caregiver puts a liquid called barium into your rectum before taking X-rays of your colon. Barium makes your colon look white. Polyps are dark, so they are easy to see in the X-ray pictures. °· Sigmoidoscopy. A thin, flexible tube (sigmoidoscope) is placed into your rectum. The sigmoidoscope has a   has a light and tiny camera in it. The caregiver uses the sigmoidoscope to look at the last third of your colon.  Colonoscopy. This test is like sigmoidoscopy, but the caregiver looks at the entire colon. This is the most common method for finding and removing polyps. TREATMENT  Any polyps will be removed during a sigmoidoscopy or colonoscopy. The polyps are then tested for cancer. PREVENTION  To help lower your risk of getting more colon polyps:  Eat plenty of fruits and vegetables. Avoid eating fatty foods.  Do not smoke.  Avoid drinking alcohol.  Exercise every day.  Lose weight if recommended by your caregiver.  Eat plenty of calcium and folate. Foods that are rich in calcium include milk, cheese, and broccoli. Foods that are rich in folate include chickpeas, kidney beans,  and spinach. HOME CARE INSTRUCTIONS Keep all follow-up appointments as directed by your caregiver. You may need periodic exams to check for polyps. SEEK MEDICAL CARE IF: You notice bleeding during a bowel movement.   This information is not intended to replace advice given to you by your health care provider. Make sure you discuss any questions you have with your health care provider.   Document Released: 03/18/2004 Document Revised: 07/13/2014 Document Reviewed: 09/01/2011 Elsevier Interactive Patient Education [employer].

## 2015-12-26 ENCOUNTER — Telehealth: Payer: Self-pay

## 2015-12-26 ENCOUNTER — Encounter: Payer: Self-pay | Admitting: Internal Medicine

## 2015-12-26 NOTE — Telephone Encounter (Signed)
Per RMR- Send letter to patient.  Send copy of letter with path to referring provider and PCP.   Would offer a follow-up extender in 6-8 weeks in reference to diarrhea

## 2015-12-26 NOTE — Telephone Encounter (Signed)
OV made and letter mailed °

## 2015-12-26 NOTE — Telephone Encounter (Signed)
Letter mailed to the pt. 

## 2015-12-27 ENCOUNTER — Encounter (HOSPITAL_COMMUNITY): Payer: Self-pay | Admitting: Internal Medicine

## 2015-12-27 ENCOUNTER — Encounter: Payer: Self-pay | Admitting: Gastroenterology

## 2015-12-31 ENCOUNTER — Other Ambulatory Visit: Payer: Self-pay | Admitting: Gastroenterology

## 2015-12-31 MED ORDER — COLESTIPOL HCL 1 G PO TABS: 1.0000 g | ORAL_TABLET | Freq: Two times a day (BID) | ORAL | Status: DC

## 2016-02-12 ENCOUNTER — Encounter: Payer: Self-pay | Admitting: Gastroenterology

## 2016-02-12 ENCOUNTER — Ambulatory Visit (INDEPENDENT_AMBULATORY_CARE_PROVIDER_SITE_OTHER): Payer: Self-pay | Admitting: Gastroenterology

## 2016-02-12 VITALS — BP 133/87 | HR 83 | Temp 97.3°F | Ht 64.0 in | Wt 215.4 lb

## 2016-02-12 DIAGNOSIS — K909 Intestinal malabsorption, unspecified: Secondary | ICD-10-CM

## 2016-02-12 DIAGNOSIS — K9089 Other intestinal malabsorption: Secondary | ICD-10-CM | POA: Insufficient documentation

## 2016-02-12 DIAGNOSIS — K529 Noninfective gastroenteritis and colitis, unspecified: Secondary | ICD-10-CM

## 2016-02-12 MED ORDER — COLESTIPOL HCL 1 G PO TABS: 1.0000 g | ORAL_TABLET | Freq: Two times a day (BID) | ORAL | 11 refills | Status: DC

## 2016-02-12 NOTE — Assessment & Plan Note (Signed)
Unremarkable colonoscopy with regards to random colon biopsies. C. difficile negative. Suspected bowel salt induced diarrhea. She is doing remarkably well on Colestid 1 g daily. Continue current regimen. Call with any concerns or questions. Return back to the office in 2 years for follow-up.

## 2016-02-12 NOTE — Progress Notes (Signed)
      Primary Care Physician: Mickie Hillier, MD  Primary Gastroenterologist:    Chief Complaint  Patient presents with  . Follow-up    doing well    HPI: Julie Brown is a 65 y.o. female here For follow-up. She underwent her surveillance colonoscopy back in June for history of multiple tubular adenomas, family history of colon cancer. When I saw the office she was also complaining of diarrhea since her gallbladder surgery in 2013. She actually had acute on chronic diarrhea after taking one week course of clindamycin. Her C. difficile PCR was negative. At time of colonoscopy she had several tubular adenomas removed. Repeat colonoscopy planned for 5 years. She also had random colon biopsies which were negative. She was started on Colestid 1 g twice a day for suspected bile acid diarrhea.   Patient states she is doing very well. Diarrhea has resolved. She had back down to Colestid 1 g once daily because she was having days without a bowel movement. Denies melena, rectal bleeding, abdominal pain.   Current Outpatient Prescriptions  Medication Sig Dispense Refill  . calcium-vitamin D (OSCAL WITH D) 500-200 MG-UNIT tablet Take 1 tablet by mouth daily with breakfast.    . cetirizine (ZYRTEC) 10 MG tablet Take 10 mg by mouth daily.    . colestipol (COLESTID) 1 g tablet Take 1 tablet (1 g total) by mouth 2 (two) times daily. Do not take within 2 hours of other medications. (Patient taking differently: Take 1 g by mouth daily. Do not take within 2 hours of other medications.) 60 tablet 1  . Multiple Vitamins-Minerals (WOMENS 50+ ADVANCED PO) Take 1 tablet by mouth daily.    . Probiotic Product (DIGESTIVE ADVANTAGE PO) Take 1 capsule by mouth daily.     No current facility-administered medications for this visit.     Allergies as of 02/12/2016 - Review Complete 02/12/2016  Allergen Reaction Noted  . Codeine Nausea And Vomiting   . Hydrocodone Nausea And Vomiting 11/25/2012     ROS:  General: Negative for anorexia, weight loss, fever, chills, fatigue, weakness. ENT: Negative for hoarseness, difficulty swallowing , nasal congestion. CV: Negative for chest pain, angina, palpitations, dyspnea on exertion, peripheral edema.  Respiratory: Negative for dyspnea at rest, dyspnea on exertion, cough, sputum, wheezing.  GI: See history of present illness. GU:  Negative for dysuria, hematuria, urinary incontinence, urinary frequency, nocturnal urination.  Endo: Negative for unusual weight change.    Physical Examination:   BP 133/87   Pulse 83   Temp 97.3 F (36.3 C) (Oral)   Ht 5\' 4"  (1.626 m)   Wt 215 lb 6.4 oz (97.7 kg)   BMI 36.97 kg/m   General: Well-nourished, well-developed in no acute distress.  Eyes: No icterus. Mouth: Oropharyngeal mucosa moist and pink , no lesions erythema or exudate.  Abdomen: Bowel sounds are normal, nontender, nondistended  Extremities: No lower extremity edema. No clubbing or deformities. Neuro: Alert and oriented x 4   Skin: Warm and dry, no jaundice.   Psych: Alert and cooperative, normal mood and affect.

## 2016-02-12 NOTE — Patient Instructions (Signed)
1. Continue Colestid 1 g 1-2 times daily. Hold for constipation. Prescription sent to pharmacy. 2. Return to the office in 2 years or call sooner if needed.

## 2016-02-12 NOTE — Progress Notes (Signed)
cc'ed to pcp °

## 2016-03-02 ENCOUNTER — Encounter: Payer: Self-pay | Admitting: Family Medicine

## 2016-03-02 MED ORDER — BENEFIBER PO POWD: ORAL | 0 refills | Status: DC

## 2016-03-23 ENCOUNTER — Encounter: Payer: Self-pay | Admitting: Nurse Practitioner

## 2016-03-23 ENCOUNTER — Ambulatory Visit (INDEPENDENT_AMBULATORY_CARE_PROVIDER_SITE_OTHER): Payer: Self-pay | Admitting: Nurse Practitioner

## 2016-03-23 VITALS — BP 142/90 | Temp 98.6°F | Ht 64.0 in | Wt 214.0 lb

## 2016-03-23 DIAGNOSIS — B349 Viral infection, unspecified: Secondary | ICD-10-CM

## 2016-03-23 DIAGNOSIS — J069 Acute upper respiratory infection, unspecified: Secondary | ICD-10-CM

## 2016-03-23 MED ORDER — LEVOFLOXACIN 500 MG PO TABS: 500.0000 mg | ORAL_TABLET | Freq: Every day | ORAL | 1 refills | Status: DC

## 2016-03-23 NOTE — Progress Notes (Signed)
Subjective:  Presents for complaints of sinus congestion and cough that began 2 days ago. No fever. Sore throat. Facial area headache. Runny nose. Slight cough. Ear pain. Had 4 episodes of diarrhea yesterday, to today. No vomiting. No abdominal pain. Taking fluids well. Voiding normal limit. No wheezing.  Objective:   BP (!) 142/90   Temp 98.6 F (37 C) (Oral)   Ht 5\' 4"  (1.626 m)   Wt 214 lb (97.1 kg)   BMI 36.73 kg/m  NAD. Alert, oriented. TMs mild clear effusion, no erythema. Pharynx clear moist. Neck supple with moderate soft anterior adenopathy. Lungs clear. Heart regular rate rhythm. Abdomen soft nontender.  Assessment: Viral illness  Acute upper respiratory infection  Plan:  Meds ordered this encounter  Medications  . levofloxacin (LEVAQUIN) 500 MG tablet    Sig: Take 1 tablet (500 mg total) by mouth daily.    Dispense:  10 tablet    Refill:  1    Order Specific Question:   Supervising Provider    Answer:   Mikey Kirschner [id number]   Patient will be leaving for [place] for 5 weeks. Start Levaquin as directed if needed in a few days. Continue probiotic as needed. Given one refill for Levaquin in case it is needed during her trip. Continue OTC meds as directed. Call back if worsens or persists. Warning signs reviewed.

## 2016-08-25 ENCOUNTER — Ambulatory Visit (INDEPENDENT_AMBULATORY_CARE_PROVIDER_SITE_OTHER): Payer: Self-pay | Admitting: Family Medicine

## 2016-08-25 ENCOUNTER — Encounter: Payer: Self-pay | Admitting: Family Medicine

## 2016-08-25 VITALS — BP 120/84 | Temp 98.3°F | Ht 64.0 in | Wt 220.2 lb

## 2016-08-25 DIAGNOSIS — J329 Chronic sinusitis, unspecified: Secondary | ICD-10-CM

## 2016-08-25 MED ORDER — LEVOFLOXACIN 500 MG PO TABS: 500.0000 mg | ORAL_TABLET | Freq: Every day | ORAL | 0 refills | Status: DC

## 2016-08-25 NOTE — Progress Notes (Signed)
   Subjective:    Patient ID: Julie Brown, female    DOB: 29-Jun-1951, 66 y.o.   MRN: [id number]  Cough  This is a new problem. The current episode started in the past 7 days. The problem has been unchanged. Associated symptoms include ear pain, headaches and wheezing. Associated symptoms comments: diarrhea. Nothing aggravates the symptoms. Treatments tried: Nyquil, emergen C, Zicam. The treatment provided no relief.   Tickle in the throat on Friday, then cough and ocng   Felt bad yest headache   Cong in nasal passages  Some diarrhea  Energy level tired  No appetite  Given flu shot last  Fall   Using nyquil prn for cough   And robitussin Patient has no other concerns at this time.   Review of Systems  HENT: Positive for ear pain.   Respiratory: Positive for cough and wheezing.   Neurological: Positive for headaches.       Objective:   Physical Exam  Alert, mild malaise. Hydration good Vitals stable. frontal/ maxillary tenderness evident positive nasal congestion. pharynx normal neck supple  lungs clear/no crackles or wheezes. heart regular in rhythm       Assessment & Plan:  Impression rhinosinusitis likely post viral, discussed with patient. plan antibiotics prescribed. Questions answered. Symptomatic care discussed. warning signs discussed. WSL

## 2016-09-03 ENCOUNTER — Telehealth: Payer: Self-pay | Admitting: Family Medicine

## 2016-09-03 MED ORDER — AMOXICILLIN-POT CLAVULANATE 875-125 MG PO TABS: 1.0000 | ORAL_TABLET | Freq: Two times a day (BID) | ORAL | 0 refills | Status: DC

## 2016-09-03 NOTE — Telephone Encounter (Signed)
Patient states she was prescribed Levaquin and she has completed it. She is still having cough and congestion. No other symptoms.

## 2016-09-03 NOTE — Telephone Encounter (Signed)
Patient was seen on 08/25/16 for rhinosinusitis by Dr. Richardson Landry.  She called today saying she is still having cough and congestion.  She is not having trouble breathing or wheezing.  She is requesting a nurse to call her back.   [facility]

## 2016-09-03 NOTE — Telephone Encounter (Signed)
Aug 875 bid ten d 

## 2016-09-03 NOTE — Telephone Encounter (Signed)
Med sent to pharmacy. Patient was notified.  

## 2017-06-04 ENCOUNTER — Telehealth: Payer: Self-pay | Admitting: *Deleted

## 2017-06-04 NOTE — Telephone Encounter (Signed)
Pt states she has bilateral leg and foot swelling for a very long time. Having pain in feet and legs. Pt wanted appt for Monday dec 3rd. Advised pt that she should go to the ED. Pt declined due to not having insurance. Pt transferred to front to schedule office visit on Monday but advised she should have it checked right away at the ED.

## 2017-06-04 NOTE — Telephone Encounter (Signed)
That's ok I agree with approach

## 2017-06-07 ENCOUNTER — Encounter: Payer: Self-pay | Admitting: Nurse Practitioner

## 2017-06-07 ENCOUNTER — Ambulatory Visit: Payer: Self-pay | Admitting: Nurse Practitioner

## 2017-06-07 ENCOUNTER — Telehealth: Payer: Self-pay | Admitting: Nurse Practitioner

## 2017-06-07 VITALS — BP 128/80 | Temp 98.4°F | Wt 226.1 lb

## 2017-06-07 DIAGNOSIS — R609 Edema, unspecified: Secondary | ICD-10-CM

## 2017-06-07 DIAGNOSIS — J0120 Acute ethmoidal sinusitis, unspecified: Secondary | ICD-10-CM

## 2017-06-07 MED ORDER — HYDROCHLOROTHIAZIDE 25 MG PO TABS: 25.0000 mg | ORAL_TABLET | Freq: Every day | ORAL | 0 refills | Status: DC

## 2017-06-07 MED ORDER — SULFAMETHOXAZOLE-TRIMETHOPRIM 800-160 MG PO TABS: 1.0000 | ORAL_TABLET | Freq: Two times a day (BID) | ORAL | 0 refills | Status: DC

## 2017-06-07 NOTE — Patient Instructions (Signed)
Please send recent labs from October   Edema Edema is when you have too much fluid in your body or under your skin. Edema may make your legs, feet, and ankles swell up. Swelling is also common in looser tissues, like around your eyes. This is a common condition. It gets more common as you get older. There are many possible causes of edema. Eating too much salt (sodium) and being on your feet or sitting for a long time can cause edema in your legs, feet, and ankles. Hot weather may make edema worse. Edema is usually painless. Your skin may look swollen or shiny. Follow these instructions at home:  Keep the swollen body part raised (elevated) above the level of your heart when you are sitting or lying down.  Do not sit still or stand for a long time.  Do not wear tight clothes. Do not wear garters on your upper legs.  Exercise your legs. This can help the swelling go down.  Wear elastic bandages or support stockings as told by your doctor.  Eat a low-salt (low-sodium) diet to reduce fluid as told by your doctor.  Depending on the cause of your swelling, you may need to limit how much fluid you drink (fluid restriction).  Take over-the-counter and prescription medicines only as told by your doctor. Contact a doctor if:  Treatment is not working.  You have heart, liver, or kidney disease and have symptoms of edema.  You have sudden and unexplained weight gain. Get help right away if:  You have shortness of breath or chest pain.  You cannot breathe when you lie down.  You have pain, redness, or warmth in the swollen areas.  You have heart, liver, or kidney disease and get edema all of a sudden.  You have a fever and your symptoms get worse all of a sudden. Summary  Edema is when you have too much fluid in your body or under your skin.  Edema may make your legs, feet, and ankles swell up. Swelling is also common in looser tissues, like around your eyes.  Raise (elevate) the  swollen body part above the level of your heart when you are sitting or lying down.  Follow your doctor's instructions about diet and how much fluid you can drink (fluid restriction). This information is not intended to replace advice given to you by your health care provider. Make sure you discuss any questions you have with your health care provider. Document Released: 12/09/2007 Document Revised: 07/10/2016 Document Reviewed: 07/10/2016 Elsevier Interactive Patient Education  2017 [employer].

## 2017-06-07 NOTE — Telephone Encounter (Signed)
See blood work results in yellow folder on desk.

## 2017-06-07 NOTE — Telephone Encounter (Signed)
Spoke with patient and informed her per Linzie Collin-  : Reviewed her labs from Dr. Onalee Hua office. Everything good except for cholesterol. Recommend low fat, low cholesterol diet. Consider physical and repeat test in 2019 when she goes on Medicare. Patient verbalized understanding.

## 2017-06-07 NOTE — Progress Notes (Signed)
Subjective: Presents for complaints of swelling in both of her lower legs off and on for the past year.  Last week she was on her feet for 5 days total and by the end of the week her legs were extremely swollen and tender.  This is unusual for her to be on her feet this much.  No added salt to her diet.  Uses a salt substitute.  No cough or orthopnea.  No unusual shortness of breath.  No chest pain/ischemic type pain.  Had lab work recently done through Dr. Denna Haggard a local dermatologist, unavailable during office visit but patient signed a release of information so we can get these results.  Also complaints of sinus symptoms over the past week.  No fever.  Ethmoid sinus area headache.  Producing thick green drainage.  No ear pain or sore throat.  Rare cough.  Objective:   BP 128/80   Temp 98.4 F (36.9 C) (Oral)   Wt 226 lb 2 oz (102.6 kg)   BMI 38.81 kg/m  NAD.  Alert, oriented.  TMs clear effusion, no erythema.  Obvious head congestion noted.  Pharynx non-erythematous with green PND noted.  Neck supple with mild soft anterior adenopathy.  Lungs clear.  Heart regular rate and rhythm.  Faint grade 2/6 soft early systolic murmur noted loudest over the second ICS-RSB.  Patient states this has been a long-term murmur.  Carotids no bruits or thrills.  Murmur does not radiate into the carotids.  Abdomen obese soft nondistended nontender.  Lower extremities 2+ pitting edema with mild tenderness.  Strong DP pulses bilateral.  Toes warm with normal capillary refill.  Assessment:   Problem List Items Addressed This Visit      Other   Peripheral edema    Other Visit Diagnoses    Acute non-recurrent ethmoidal sinusitis    -  Primary   Relevant Medications   sulfamethoxazole-trimethoprim (BACTRIM DS,SEPTRA DS) 800-160 MG tablet       Plan:   Meds ordered this encounter  Medications  . hydrochlorothiazide (HYDRODIURIL) 25 MG tablet    Sig: Take 1 tablet (25 mg total) by mouth daily.    Dispense:  90  tablet    Refill:  0    Order Specific Question:   Supervising Provider    Answer:   Mikey Kirschner [[id number]]  . sulfamethoxazole-trimethoprim (BACTRIM DS,SEPTRA DS) 800-160 MG tablet    Sig: Take 1 tablet by mouth 2 (two) times daily.    Dispense:  20 tablet    Refill:  0    Order Specific Question:   Supervising Provider    Answer:   Mikey Kirschner [[id number]]   Trial of HCTZ daily for peripheral edema.  Call back in 2-3 weeks if no improvement, consider switching to a loop diuretic at that time.  Discussed importance of regular activity and particularly weight loss.  OTC meds as directed for congestion.  Call back if worsens or persist.  Patient will be going on Medicare next year, strongly recommend preventive health physical.  She is uninsured at this point so no lab work done today until we can see the labs done by her dermatologist.

## 2017-06-07 NOTE — Telephone Encounter (Signed)
PER CAROLYN  : Reviewed her labs from Dr. Onalee Hua office. Everything good except for cholesterol. Recommend low fat, low cholesterol diet. Consider physical and repeat test in 2019 when she goes on Medicare.

## 2017-06-17 ENCOUNTER — Other Ambulatory Visit: Payer: Self-pay | Admitting: Nurse Practitioner

## 2017-06-17 ENCOUNTER — Encounter: Payer: Self-pay | Admitting: Nurse Practitioner

## 2017-06-17 MED ORDER — LEVOFLOXACIN 500 MG PO TABS: 500.0000 mg | ORAL_TABLET | Freq: Every day | ORAL | 0 refills | Status: DC

## 2017-06-17 NOTE — Telephone Encounter (Signed)
See my chart message

## 2017-06-17 NOTE — Telephone Encounter (Signed)
Already answered through my chart. See message

## 2017-06-18 ENCOUNTER — Other Ambulatory Visit: Payer: Self-pay | Admitting: Nurse Practitioner

## 2017-06-18 MED ORDER — LEVOFLOXACIN 500 MG PO TABS: 500.0000 mg | ORAL_TABLET | Freq: Every day | ORAL | 0 refills | Status: DC

## 2017-07-14 ENCOUNTER — Telehealth: Payer: Self-pay

## 2017-07-15 ENCOUNTER — Ambulatory Visit (INDEPENDENT_AMBULATORY_CARE_PROVIDER_SITE_OTHER): Payer: Self-pay | Admitting: Family Medicine

## 2017-07-15 VITALS — BP 128/82 | Ht 64.0 in | Wt 228.4 lb

## 2017-07-15 DIAGNOSIS — M25561 Pain in right knee: Secondary | ICD-10-CM

## 2017-07-15 DIAGNOSIS — S8991XA Unspecified injury of right lower leg, initial encounter: Secondary | ICD-10-CM

## 2017-07-15 MED ORDER — TRAMADOL HCL 50 MG PO TABS: 50.0000 mg | ORAL_TABLET | Freq: Every evening | ORAL | 1 refills | Status: DC | PRN

## 2017-07-15 NOTE — Progress Notes (Signed)
   Subjective:    Patient ID: [name], female    DOB: [birth year], 67 y.o.   MRN: [id number]  HPI  Patient arrives with c/o right knee pain for 3 weeks. Patient had a injury with her dog.   60 pound dog struck leg  Jerked   Popped   Pain and discomfort  hing and pain and tend  Review of Systems No headache, no major weight loss or weight gain, no chest pain no back pain abdominal pain no change in bowel habits complete ROS otherwise negative     Objective:   Physical Exam Alert vitals stable, NAD. Blood pressure good on repeat. HEENT normal. Lungs clear. Heart regular rate and rhythm. Right knee mild effusion apparent.  No joint laxity.  Distinct right medial joint line tenderness       Assessment & Plan:  Impression probable medial meniscal injury.  Discussed at length.  With no insurance patient chooses to hold off on workup and management at this time.  If persist patient will workup further when she gets Medicare in a few months Ultram nightly as needed for pain

## 2017-11-05 DIAGNOSIS — M25561 Pain in right knee: Secondary | ICD-10-CM | POA: Diagnosis not present

## 2017-11-08 ENCOUNTER — Encounter: Payer: Self-pay | Admitting: Obstetrics & Gynecology

## 2017-11-08 ENCOUNTER — Other Ambulatory Visit: Payer: Self-pay | Admitting: Obstetrics & Gynecology

## 2017-11-08 ENCOUNTER — Ambulatory Visit: Payer: Medicare HMO | Admitting: Obstetrics & Gynecology

## 2017-11-08 ENCOUNTER — Other Ambulatory Visit (HOSPITAL_COMMUNITY)
Admission: RE | Admit: 2017-11-08 | Discharge: 2017-11-08 | Disposition: A | Discharge status: Home / Self Care | Payer: Medicare HMO | Source: Ambulatory Visit | Attending: Obstetrics & Gynecology | Admitting: Obstetrics & Gynecology

## 2017-11-08 VITALS — BP 110/70 | HR 74 | Ht 64.0 in | Wt 229.0 lb

## 2017-11-08 DIAGNOSIS — Z1151 Encounter for screening for human papillomavirus (HPV): Secondary | ICD-10-CM | POA: Insufficient documentation

## 2017-11-08 DIAGNOSIS — N840 Polyp of corpus uteri: Secondary | ICD-10-CM

## 2017-11-08 DIAGNOSIS — R739 Hyperglycemia, unspecified: Secondary | ICD-10-CM

## 2017-11-08 DIAGNOSIS — B373 Candidiasis of vulva and vagina: Secondary | ICD-10-CM

## 2017-11-08 DIAGNOSIS — Z01411 Encounter for gynecological examination (general) (routine) with abnormal findings: Secondary | ICD-10-CM | POA: Diagnosis not present

## 2017-11-08 DIAGNOSIS — N841 Polyp of cervix uteri: Secondary | ICD-10-CM | POA: Diagnosis not present

## 2017-11-08 DIAGNOSIS — R69 Illness, unspecified: Secondary | ICD-10-CM | POA: Diagnosis not present

## 2017-11-08 DIAGNOSIS — B3731 Acute candidiasis of vulva and vagina: Secondary | ICD-10-CM

## 2017-11-08 DIAGNOSIS — Z01419 Encounter for gynecological examination (general) (routine) without abnormal findings: Secondary | ICD-10-CM | POA: Diagnosis present

## 2017-11-08 MED ORDER — FLUCONAZOLE 100 MG PO TABS: 100.0000 mg | ORAL_TABLET | Freq: Every day | ORAL | 0 refills | Status: DC

## 2017-11-08 NOTE — Progress Notes (Signed)
Subjective:     Julie Brown is a 67 y.o. female here for a routine exam.  No LMP recorded. Patient is postmenopausal. G1P1001 Birth Control Method:  menopause Menstrual Calendar(currently): amenorrheic  Current complaints: vaginal itching.   Current acute medical issues:     Recent Gynecologic History No LMP recorded. Patient is postmenopausal. Last Pap: years ago,  normal Last mammogram: 10 years,  normal  Past Medical History:  Diagnosis Date  . Asthma   . Hayfever   . Hyperlipidemia   . Migraine headache   . PONV (postoperative nausea and vomiting)     Past Surgical History:  Procedure Laterality Date  . BREAST SURGERY     lump removed  . CHOLECYSTECTOMY  01/06/2012   Procedure: LAPAROSCOPIC CHOLECYSTECTOMY;  Surgeon: Donato Heinz, MD;  Location: [facility];  Service: General;  Laterality: N/A;  . COLONOSCOPY  07/22/06   OXB:DZHGDJ and colon polyps/left-sided diverticula. adenomatous colon polyps. surveillance TCS due 2013  . COLONOSCOPY N/A 12/25/2015   Procedure: COLONOSCOPY;  Surgeon: Daneil Dolin, MD;  Location: [facility];  Service: Endoscopy;  Laterality: N/A;  730   . ESOPHAGOGASTRODUODENOSCOPY  07/22/06   MEQ:ASTMHD/QQIWL HH otherwise normal  . FOOT SURGERY     left foot  . TUBAL LIGATION      OB History    Gravida  1   Para  1   Term  1   Preterm      AB      Living  1     SAB      TAB      Ectopic      Multiple      Live Births              Social History   Socioeconomic History  . Marital status: Married    Spouse name: Not on file  . Number of children: 1  . Years of education: Not on file  . Highest education level: Not on file  Occupational History  . Not on file  Social Needs  . Financial resource strain: Not on file  . Food insecurity:    Worry: Not on file    Inability: Not on file  . Transportation needs:    Medical: Not on file    Non-medical: Not on file  Tobacco Use  . Smoking status: Former Research scientist (life sciences)  .  Smokeless tobacco: Never Used  . Tobacco comment: quit 1990  Substance and Sexual Activity  . Alcohol use: Yes    Alcohol/week: 0.0 oz    Comment: occasional, not daily  . Drug use: No  . Sexual activity: Not Currently    Birth control/protection: None  Lifestyle  . Physical activity:    Days per week: Not on file    Minutes per session: Not on file  . Stress: Not on file  Relationships  . Social connections:    Talks on phone: Not on file    Gets together: Not on file    Attends religious service: Not on file    Active member of club or organization: Not on file    Attends meetings of clubs or organizations: Not on file    Relationship status: Not on file  Other Topics Concern  . Not on file  Social History Narrative  . Not on file    Family History  Problem Relation Age of Onset  . Colon cancer Mother        age 34  .  Kidney cancer Mother        cause of death, 3 cancer surgeries, died at age 50     Current Outpatient Medications:  .  B Complex-Biotin-FA (SUPER B-COMPLEX PO), Take by mouth. With 1,000 mcg Biotin, Disp: , Rfl:  .  Calcium Carb-Cholecalciferol (CALCIUM 1000 + D PO), Take by mouth., Disp: , Rfl:  .  cetirizine (ZYRTEC) 10 MG tablet, Take 10 mg by mouth daily., Disp: , Rfl:  .  Dupilumab, Asthma, (DUPIXENT [place]), Inject into the skin., Disp: , Rfl:  .  Probiotic Product (DIGESTIVE ADVANTAGE PO), Take 1 capsule by mouth daily., Disp: , Rfl:  .  Biotin w/ Vitamins C & E (HAIR/SKIN/NAILS PO), Take by mouth., Disp: , Rfl:  .  colestipol (COLESTID) 1 g tablet, Take 1 tablet (1 g total) by mouth 2 (two) times daily. Do not take within 2 hours of other medications. (Patient not taking: Reported on 11/08/2017), Disp: 60 tablet, Rfl: 11 .  fluconazole (DIFLUCAN) 100 MG tablet, Take 1 tablet (100 mg total) by mouth daily., Disp: 7 tablet, Rfl: 0 .  hydrochlorothiazide (HYDRODIURIL) 25 MG tablet, Take 1 tablet (25 mg total) by mouth daily. (Patient not taking: Reported  on 11/08/2017), Disp: 90 tablet, Rfl: 0 .  Multiple Vitamins-Calcium (ONE-A-DAY WOMENS PO), Take by mouth., Disp: , Rfl:  .  traMADol (ULTRAM) 50 MG tablet, Take 1 tablet (50 mg total) by mouth at bedtime as needed. (Patient not taking: Reported on 11/08/2017), Disp: 30 tablet, Rfl: 1 .  Wheat Dextrin (BENEFIBER) POWD, One scoop BID (Patient not taking: Reported on 11/08/2017), Disp: , Rfl: 0  Review of Systems  Review of Systems  Constitutional: Negative for fever, chills, weight loss, malaise/fatigue and diaphoresis.  HENT: Negative for hearing loss, ear pain, nosebleeds, congestion, sore throat, neck pain, tinnitus and ear discharge.   Eyes: Negative for blurred vision, double vision, photophobia, pain, discharge and redness.  Respiratory: Negative for cough, hemoptysis, sputum production, shortness of breath, wheezing and stridor.   Cardiovascular: Negative for chest pain, palpitations, orthopnea, claudication, leg swelling and PND.  Gastrointestinal: negative for abdominal pain. Negative for heartburn, nausea, vomiting, diarrhea, constipation, blood in stool and melena.  Genitourinary: Negative for dysuria, urgency, frequency, hematuria and flank pain.  Musculoskeletal: Negative for myalgias, back pain, joint pain and falls.  Skin: Negative for itching and rash.  Neurological: Negative for dizziness, tingling, tremors, sensory change, speech change, focal weakness, seizures, loss of consciousness, weakness and headaches.  Endo/Heme/Allergies: Negative for environmental allergies and polydipsia. Does not bruise/bleed easily.  Psychiatric/Behavioral: Negative for depression, suicidal ideas, hallucinations, memory loss and substance abuse. The patient is not nervous/anxious and does not have insomnia.        Objective:  Blood pressure 110/70, pulse 74, height 5\' 4"  (1.626 m), weight 229 lb (103.9 kg).   Physical Exam  Vitals reviewed. Constitutional: She is oriented to person, place, and time.  She appears well-developed and well-nourished.  HENT:  Head: Normocephalic and atraumatic.        Right Ear: External ear normal.  Left Ear: External ear normal.  Nose: Nose normal.  Mouth/Throat: Oropharynx is clear and moist.  Eyes: Conjunctivae and EOM are normal. Pupils are equal, round, and reactive to light. Right eye exhibits no discharge. Left eye exhibits no discharge. No scleral icterus.  Neck: Normal range of motion. Neck supple. No tracheal deviation present. No thyromegaly present.  Cardiovascular: Normal rate, regular rhythm, normal heart sounds and intact distal pulses.  Exam reveals  no gallop and no friction rub.   No murmur heard. Respiratory: Effort normal and breath sounds normal. No respiratory distress. She has no wheezes. She has no rales. She exhibits no tenderness.  GI: Soft. Bowel sounds are normal. She exhibits no distension and no mass. There is no tenderness. There is no rebound and no guarding.  Genitourinary:  Breasts no masses skin changes or nipple changes bilaterally      Vulva is normal without lesions Vagina is pink moist without discharge Cervix normal in appearance and pap is done, polyp was protruding from the os, removed in total with a ring forcep without difficulty Uterus is normal size shape and contour Adnexa is negative with normal sized ovaries  Vagina painted with gentian violet Musculoskeletal: Normal range of motion. She exhibits no edema and no tenderness.  Neurological: She is alert and oriented to person, place, and time. She has normal reflexes. She displays normal reflexes. No cranial nerve deficit. She exhibits normal muscle tone. Coordination normal.  Skin: Skin is warm and dry. No rash noted. No erythema. No pallor.  Psychiatric: She has a normal mood and affect. Her behavior is normal. Judgment and thought content normal.       Medications Ordered at today's visit: Meds ordered this encounter  Medications  . fluconazole  (DIFLUCAN) 100 MG tablet    Sig: Take 1 tablet (100 mg total) by mouth daily.    Dispense:  7 tablet    Refill:  0    Other orders placed at today's visit: Orders Placed This Encounter  Procedures  . Hemoglobin A1C      Assessment:    Healthy female exam.    Plan:    Mammogram ordered. Follow up in: 2 weeks. follow up on vaginal yeast and endocervical/endometrial polyp     Return in about 2 weeks (around 11/22/2017) for Follow up, with Dr Elonda Husky.

## 2017-11-09 LAB — CYTOLOGY - PAP
Diagnosis: NEGATIVE
HPV (WINDOPATH): NOT DETECTED

## 2017-11-09 LAB — HEMOGLOBIN A1C
ESTIMATED AVERAGE GLUCOSE: 103 mg/dL
Hgb A1c MFr Bld: 5.2 % (ref 4.8–5.6)

## 2017-11-15 ENCOUNTER — Other Ambulatory Visit: Payer: Self-pay | Admitting: Obstetrics & Gynecology

## 2017-11-15 DIAGNOSIS — Z1231 Encounter for screening mammogram for malignant neoplasm of breast: Secondary | ICD-10-CM

## 2017-11-15 DIAGNOSIS — M25561 Pain in right knee: Secondary | ICD-10-CM | POA: Diagnosis not present

## 2017-11-23 ENCOUNTER — Ambulatory Visit (INDEPENDENT_AMBULATORY_CARE_PROVIDER_SITE_OTHER): Payer: Medicare HMO | Admitting: Obstetrics & Gynecology

## 2017-11-23 ENCOUNTER — Encounter: Payer: Self-pay | Admitting: Obstetrics & Gynecology

## 2017-11-23 ENCOUNTER — Other Ambulatory Visit: Payer: Self-pay

## 2017-11-23 VITALS — BP 138/86 | HR 77 | Ht 64.0 in | Wt 228.0 lb

## 2017-11-23 DIAGNOSIS — L900 Lichen sclerosus et atrophicus: Secondary | ICD-10-CM

## 2017-11-23 MED ORDER — FLUOCINONIDE 0.05 % EX CREA: 1.0000 "application " | TOPICAL_CREAM | Freq: Two times a day (BID) | CUTANEOUS | 11 refills | Status: DC

## 2017-11-23 NOTE — Progress Notes (Signed)
Chief Complaint  Patient presents with  . Follow-up      67 y.o. G1P1001 No LMP recorded. Patient is postmenopausal. The current method of family planning is post menopausal status.  Outpatient Encounter Medications as of 11/23/2017  Medication Sig  . B Complex-Biotin-FA (SUPER B-COMPLEX PO) Take by mouth. With 1,000 mcg Biotin  . Biotin w/ Vitamins C & E (HAIR/SKIN/NAILS PO) Take by mouth.  . Calcium Carb-Cholecalciferol (CALCIUM 1000 + D PO) Take by mouth.  . cetirizine (ZYRTEC) 10 MG tablet Take 10 mg by mouth daily.  . Dupilumab, Asthma, (DUPIXENT [place]) Inject into the skin.  . Multiple Vitamins-Calcium (ONE-A-DAY WOMENS PO) Take by mouth.  . Probiotic Product (DIGESTIVE ADVANTAGE PO) Take 1 capsule by mouth daily.  . Wheat Dextrin (BENEFIBER) POWD One scoop BID  . fluocinonide cream (LIDEX) 5.64 % Apply 1 application topically 2 (two) times daily.  . hydrochlorothiazide (HYDRODIURIL) 25 MG tablet Take 1 tablet (25 mg total) by mouth daily. (Patient not taking: Reported on 11/08/2017)  . [DISCONTINUED] colestipol (COLESTID) 1 g tablet Take 1 tablet (1 g total) by mouth 2 (two) times daily. Do not take within 2 hours of other medications. (Patient not taking: Reported on 11/08/2017)  . [DISCONTINUED] fluconazole (DIFLUCAN) 100 MG tablet Take 1 tablet (100 mg total) by mouth daily.  . [DISCONTINUED] traMADol (ULTRAM) 50 MG tablet Take 1 tablet (50 mg total) by mouth at bedtime as needed. (Patient not taking: Reported on 11/08/2017)   No facility-administered encounter medications on file as of 11/23/2017.     Subjective Julie Brown is in for follow up evaluation of itching an dburning My impression at her last visit is that this represented candida on top of LSA Treated with gentian violet and extended diflucan with good results, pt reports improved symptom complex with much less ithcing and burning No new symptoms noted Past Medical History:  Diagnosis Date  . Asthma   .  Hayfever   . Hyperlipidemia   . Migraine headache   . PONV (postoperative nausea and vomiting)     Past Surgical History:  Procedure Laterality Date  . BREAST SURGERY     lump removed  . CHOLECYSTECTOMY  01/06/2012   Procedure: LAPAROSCOPIC CHOLECYSTECTOMY;  Surgeon: Donato Heinz, MD;  Location: [facility];  Service: General;  Laterality: N/A;  . COLONOSCOPY  07/22/06   PPI:RJJOAC and colon polyps/left-sided diverticula. adenomatous colon polyps. surveillance TCS due 2013  . COLONOSCOPY N/A 12/25/2015   Procedure: COLONOSCOPY;  Surgeon: Daneil Dolin, MD;  Location: [facility];  Service: Endoscopy;  Laterality: N/A;  730   . ESOPHAGOGASTRODUODENOSCOPY  07/22/06   ZYS:AYTKZS/WFUXN HH otherwise normal  . FOOT SURGERY     left foot  . TUBAL LIGATION      OB History    Gravida  1   Para  1   Term  1   Preterm      AB      Living  1     SAB      TAB      Ectopic      Multiple      Live Births              Allergies  Allergen Reactions  . Codeine Nausea And Vomiting  . Hydrocodone Nausea And Vomiting    Social History   Socioeconomic History  . Marital status: Married    Spouse name: Not on file  . Number  of children: 1  . Years of education: Not on file  . Highest education level: Not on file  Occupational History  . Not on file  Social Needs  . Financial resource strain: Not on file  . Food insecurity:    Worry: Not on file    Inability: Not on file  . Transportation needs:    Medical: Not on file    Non-medical: Not on file  Tobacco Use  . Smoking status: Former Research scientist (life sciences)  . Smokeless tobacco: Never Used  . Tobacco comment: quit 1990  Substance and Sexual Activity  . Alcohol use: Yes    Alcohol/week: 0.0 oz    Comment: occasional, not daily  . Drug use: No  . Sexual activity: Not Currently    Birth control/protection: None  Lifestyle  . Physical activity:    Days per week: Not on file    Minutes per session: Not on file  . Stress:  Not on file  Relationships  . Social connections:    Talks on phone: Not on file    Gets together: Not on file    Attends religious service: Not on file    Active member of club or organization: Not on file    Attends meetings of clubs or organizations: Not on file    Relationship status: Not on file  Other Topics Concern  . Not on file  Social History Narrative  . Not on file    Family History  Problem Relation Age of Onset  . Colon cancer Mother        age 68  . Kidney cancer Mother        cause of death, 40 cancer surgeries, died at age 68    Medications:       Current Outpatient Medications:  .  B Complex-Biotin-FA (SUPER B-COMPLEX PO), Take by mouth. With 1,000 mcg Biotin, Disp: , Rfl:  .  Biotin w/ Vitamins C & E (HAIR/SKIN/NAILS PO), Take by mouth., Disp: , Rfl:  .  Calcium Carb-Cholecalciferol (CALCIUM 1000 + D PO), Take by mouth., Disp: , Rfl:  .  cetirizine (ZYRTEC) 10 MG tablet, Take 10 mg by mouth daily., Disp: , Rfl:  .  Dupilumab, Asthma, (DUPIXENT [place]), Inject into the skin., Disp: , Rfl:  .  Multiple Vitamins-Calcium (ONE-A-DAY WOMENS PO), Take by mouth., Disp: , Rfl:  .  Probiotic Product (DIGESTIVE ADVANTAGE PO), Take 1 capsule by mouth daily., Disp: , Rfl:  .  Wheat Dextrin (BENEFIBER) POWD, One scoop BID, Disp: , Rfl: 0 .  fluocinonide cream (LIDEX) 6.64 %, Apply 1 application topically 2 (two) times daily., Disp: 30 g, Rfl: 11 .  hydrochlorothiazide (HYDRODIURIL) 25 MG tablet, Take 1 tablet (25 mg total) by mouth daily. (Patient not taking: Reported on 11/08/2017), Disp: 90 tablet, Rfl: 0  Objective Blood pressure 138/86, pulse 77, height 5\' 4"  (1.626 m), weight 228 lb (103.4 kg).  General WDWN female NAD Vulva:  Atrophic and erythema, no leukoplakia or otherwise lesions which would require any biopsy Vagina:  atrophic, no discharge Cervix:  no lesions Uterus:   Adnexa: ovaries:,     Pertinent ROS No burning with urination, frequency or urgency No  nausea, vomiting or diarrhea Nor fever chills or other constitutional symptoms   Labs or studies     Impression Diagnoses this Encounter::   [id number]   1. Lichen sclerosus et atrophicus L90.0     Established relevant diagnosis(es):   Plan/Recommendations: Meds ordered this encounter  Medications  .  fluocinonide cream (LIDEX) 0.05 %    Sig: Apply 1 application topically 2 (two) times daily.    Dispense:  30 g    Refill:  11    Labs or Scans Ordered: No orders of the defined types were placed in this encounter.   Management:: Begin topical steroid therapy, moderate potency with lidex and follow up in 6 weeks  Follow up Return in about 6 weeks (around 01/04/2018) for Follow up, with Dr Elonda Husky.       All questions were answered.

## 2017-11-24 ENCOUNTER — Encounter (HOSPITAL_COMMUNITY): Payer: Self-pay

## 2017-11-24 ENCOUNTER — Ambulatory Visit (HOSPITAL_COMMUNITY)
Admission: RE | Admit: 2017-11-24 | Discharge: 2017-11-24 | Disposition: A | Discharge status: Home / Self Care | Payer: Medicare HMO | Source: Ambulatory Visit | Attending: Obstetrics & Gynecology | Admitting: Obstetrics & Gynecology

## 2017-11-24 DIAGNOSIS — Z1231 Encounter for screening mammogram for malignant neoplasm of breast: Secondary | ICD-10-CM | POA: Diagnosis not present

## 2017-11-25 DIAGNOSIS — M25561 Pain in right knee: Secondary | ICD-10-CM | POA: Diagnosis not present

## 2017-12-28 DIAGNOSIS — M948X6 Other specified disorders of cartilage, lower leg: Secondary | ICD-10-CM | POA: Diagnosis not present

## 2017-12-28 DIAGNOSIS — M23306 Other meniscus derangements, unspecified meniscus, right knee: Secondary | ICD-10-CM | POA: Diagnosis not present

## 2017-12-28 DIAGNOSIS — G8918 Other acute postprocedural pain: Secondary | ICD-10-CM | POA: Diagnosis not present

## 2017-12-28 DIAGNOSIS — M23231 Derangement of other medial meniscus due to old tear or injury, right knee: Secondary | ICD-10-CM | POA: Diagnosis not present

## 2017-12-28 DIAGNOSIS — M23321 Other meniscus derangements, posterior horn of medial meniscus, right knee: Secondary | ICD-10-CM | POA: Diagnosis not present

## 2017-12-28 DIAGNOSIS — S8012XD Contusion of left lower leg, subsequent encounter: Secondary | ICD-10-CM | POA: Diagnosis not present

## 2017-12-28 HISTORY — PX: MENISCUS REPAIR: SHX5179

## 2018-01-04 ENCOUNTER — Ambulatory Visit: Payer: Medicare HMO | Admitting: Obstetrics & Gynecology

## 2018-01-10 ENCOUNTER — Ambulatory Visit: Payer: Medicare HMO | Admitting: Obstetrics & Gynecology

## 2018-01-24 ENCOUNTER — Ambulatory Visit: Payer: Medicare HMO | Admitting: Obstetrics & Gynecology

## 2018-01-24 ENCOUNTER — Other Ambulatory Visit: Payer: Self-pay

## 2018-01-24 ENCOUNTER — Encounter: Payer: Self-pay | Admitting: Obstetrics & Gynecology

## 2018-01-24 VITALS — BP 126/83 | HR 97 | Ht 64.0 in | Wt 230.0 lb

## 2018-01-24 DIAGNOSIS — L900 Lichen sclerosus et atrophicus: Secondary | ICD-10-CM

## 2018-01-24 NOTE — Progress Notes (Signed)
Chief Complaint  Patient presents with  . Follow-up    lichen sclerosis      67 y.o. G1P1001 No LMP recorded. Patient is postmenopausal. The current method of family planning is menopause  Outpatient Encounter Medications as of 01/24/2018  Medication Sig  . aspirin EC 81 MG tablet Take 81 mg by mouth daily.  . B Complex-Biotin-FA (SUPER B-COMPLEX PO) Take by mouth. With 1,000 mcg Biotin  . Biotin w/ Vitamins C & E (HAIR/SKIN/NAILS PO) Take by mouth.  . Calcium Carb-Cholecalciferol (CALCIUM 1000 + D PO) Take by mouth.  . cetirizine (ZYRTEC) 10 MG tablet Take 10 mg by mouth daily.  . Dupilumab, Asthma, (DUPIXENT [place]) Inject into the skin.  . fluocinonide cream (LIDEX) 2.50 % Apply 1 application topically 2 (two) times daily.  . Multiple Vitamins-Calcium (ONE-A-DAY WOMENS PO) Take by mouth.  . Probiotic Product (DIGESTIVE ADVANTAGE PO) Take 1 capsule by mouth daily.  . Wheat Dextrin (BENEFIBER) POWD One scoop BID  . [DISCONTINUED] hydrochlorothiazide (HYDRODIURIL) 25 MG tablet Take 1 tablet (25 mg total) by mouth daily. (Patient not taking: Reported on 11/08/2017)   No facility-administered encounter medications on file as of 01/24/2018.     Subjective Julie Brown here for follow up of LSA with superimposed yeast Past Medical History:  Diagnosis Date  . Asthma   . Hayfever   . Hyperlipidemia   . Migraine headache   . PONV (postoperative nausea and vomiting)     Past Surgical History:  Procedure Laterality Date  . BREAST SURGERY     lump removed  . CHOLECYSTECTOMY  01/06/2012   Procedure: LAPAROSCOPIC CHOLECYSTECTOMY;  Surgeon: Donato Heinz, MD;  Location: [facility];  Service: General;  Laterality: N/A;  . COLONOSCOPY  07/22/06   NLZ:JQBHAL and colon polyps/left-sided diverticula. adenomatous colon polyps. surveillance TCS due 2013  . COLONOSCOPY N/A 12/25/2015   Procedure: COLONOSCOPY;  Surgeon: Daneil Dolin, MD;  Location: [facility];  Service: Endoscopy;   Laterality: N/A;  730   . ESOPHAGOGASTRODUODENOSCOPY  07/22/06   PFX:TKWIOX/BDZHG HH otherwise normal  . FOOT SURGERY     left foot  . MENISCUS REPAIR Right 12/28/2017  . TUBAL LIGATION      OB History    Gravida  1   Para  1   Term  1   Preterm      AB      Living  1     SAB      TAB      Ectopic      Multiple      Live Births              Allergies  Allergen Reactions  . Codeine Nausea And Vomiting  . Hydrocodone Nausea And Vomiting    Social History   Socioeconomic History  . Marital status: Married    Spouse name: Not on file  . Number of children: 1  . Years of education: Not on file  . Highest education level: Not on file  Occupational History  . Not on file  Social Needs  . Financial resource strain: Not on file  . Food insecurity:    Worry: Not on file    Inability: Not on file  . Transportation needs:    Medical: Not on file    Non-medical: Not on file  Tobacco Use  . Smoking status: Former Research scientist (life sciences)  . Smokeless tobacco: Never Used  . Tobacco comment: quit 1990  Substance and Sexual Activity  . Alcohol use: Yes    Alcohol/week: 0.0 oz    Comment: occasional, not daily  . Drug use: No  . Sexual activity: Not Currently    Birth control/protection: None  Lifestyle  . Physical activity:    Days per week: Not on file    Minutes per session: Not on file  . Stress: Not on file  Relationships  . Social connections:    Talks on phone: Not on file    Gets together: Not on file    Attends religious service: Not on file    Active member of club or organization: Not on file    Attends meetings of clubs or organizations: Not on file    Relationship status: Not on file  Other Topics Concern  . Not on file  Social History Narrative  . Not on file    Family History  Problem Relation Age of Onset  . Colon cancer Mother        age 25  . Kidney cancer Mother        cause of death, 32 cancer surgeries, died at age 76  . Diabetes  Brother   . Hypertension Brother   . Cancer Brother     Medications:       Current Outpatient Medications:  .  aspirin EC 81 MG tablet, Take 81 mg by mouth daily., Disp: , Rfl:  .  B Complex-Biotin-FA (SUPER B-COMPLEX PO), Take by mouth. With 1,000 mcg Biotin, Disp: , Rfl:  .  Biotin w/ Vitamins C & E (HAIR/SKIN/NAILS PO), Take by mouth., Disp: , Rfl:  .  Calcium Carb-Cholecalciferol (CALCIUM 1000 + D PO), Take by mouth., Disp: , Rfl:  .  cetirizine (ZYRTEC) 10 MG tablet, Take 10 mg by mouth daily., Disp: , Rfl:  .  Dupilumab, Asthma, (DUPIXENT [place]), Inject into the skin., Disp: , Rfl:  .  fluocinonide cream (LIDEX) 4.09 %, Apply 1 application topically 2 (two) times daily., Disp: 30 g, Rfl: 11 .  Multiple Vitamins-Calcium (ONE-A-DAY WOMENS PO), Take by mouth., Disp: , Rfl:  .  Probiotic Product (DIGESTIVE ADVANTAGE PO), Take 1 capsule by mouth daily., Disp: , Rfl:  .  Wheat Dextrin (BENEFIBER) POWD, One scoop BID, Disp: , Rfl: 0  Objective Blood pressure 126/83, pulse 97, height 5\' 4"  (1.626 m), weight 230 lb (104.3 kg).  General WDWN female NAD Vulva:  normal appearing vulva with no masses, tenderness or lesions Vagina:  normal mucosa, no discharge, no evidnece of LSA Cervix:   Uterus:   Adnexa: ovaries:,     Pertinent ROS No burning with urination, frequency or urgency No nausea, vomiting or diarrhea Nor fever chills or other constitutional symptoms   Labs or studies     Impression Diagnoses this Encounter::   [id number]   1. Lichen sclerosus et atrophicus L90.0     Established relevant diagnosis(es):   Plan/Recommendations: No orders of the defined types were placed in this encounter.   Labs or Scans Ordered: No orders of the defined types were placed in this encounter.   Management:: Continue lidex E 0.5% qohs or as needed depending on symptom complex  Follow up Return if symptoms worsen or fail to improve.       All questions were answered.

## 2018-02-22 DIAGNOSIS — Z4789 Encounter for other orthopedic aftercare: Secondary | ICD-10-CM | POA: Diagnosis not present

## 2018-02-22 DIAGNOSIS — S83241D Other tear of medial meniscus, current injury, right knee, subsequent encounter: Secondary | ICD-10-CM | POA: Diagnosis not present

## 2018-02-22 DIAGNOSIS — M1711 Unilateral primary osteoarthritis, right knee: Secondary | ICD-10-CM | POA: Diagnosis not present

## 2018-03-28 ENCOUNTER — Telehealth: Payer: Self-pay | Admitting: Family Medicine

## 2018-03-28 NOTE — Telephone Encounter (Signed)
Sinus symptoms for a week  No fever, no V&D Facial pressure, little cough, green snot  NTBS? Please advise   [facility]

## 2018-03-28 NOTE — Telephone Encounter (Signed)
Patient is aware we have no available appt today. If thinks she needs to be seen today she will have to go to the urgent care to be evaluated. She states understanding.

## 2018-03-29 ENCOUNTER — Ambulatory Visit (INDEPENDENT_AMBULATORY_CARE_PROVIDER_SITE_OTHER): Payer: Medicare HMO | Admitting: Family Medicine

## 2018-03-29 ENCOUNTER — Encounter: Payer: Self-pay | Admitting: Family Medicine

## 2018-03-29 ENCOUNTER — Ambulatory Visit: Payer: Medicare HMO | Admitting: Family Medicine

## 2018-03-29 VITALS — BP 122/88 | Temp 98.2°F | Ht 64.0 in | Wt 228.0 lb

## 2018-03-29 DIAGNOSIS — J310 Chronic rhinitis: Secondary | ICD-10-CM

## 2018-03-29 DIAGNOSIS — J329 Chronic sinusitis, unspecified: Secondary | ICD-10-CM

## 2018-03-29 MED ORDER — LEVOFLOXACIN 500 MG PO TABS: 500.0000 mg | ORAL_TABLET | Freq: Every day | ORAL | 0 refills | Status: AC

## 2018-03-29 NOTE — Progress Notes (Signed)
   Subjective:    Patient ID: Julie Brown, female    DOB: [birth year], 67 y.o.   MRN: [id number]  Cough  This is a new problem. Episode onset: one week. Associated symptoms include headaches. Treatments tried: dayquil, nyquil, aspirin.    Pos stuff and cong   One week ago   Cong and stuffiness    headache frontal   Ear pain prety bad      Review of Systems  Respiratory: Positive for cough.   Neurological: Positive for headaches.       Objective:   Physical Exam Alert, mild malaise. Hydration good Vitals stable. frontal/ maxillary tenderness evident positive nasal congestion. pharynx normal neck supple  lungs clear/no crackles or wheezes. heart regular in rhythm        Assessment & Plan:  Impression rhinosinusitis likely post viral, discussed with patient. plan antibiotics prescribed. Questions answered. Symptomatic care discussed. warning signs discussed. WSL Patient specifically requests Levaquin, potential for more serious side effects discussed with patient, patient still requested states "I always have to go to it"

## 2018-04-01 DIAGNOSIS — J329 Chronic sinusitis, unspecified: Secondary | ICD-10-CM | POA: Diagnosis not present

## 2018-04-01 DIAGNOSIS — M1711 Unilateral primary osteoarthritis, right knee: Secondary | ICD-10-CM | POA: Diagnosis not present

## 2018-04-01 DIAGNOSIS — Z791 Long term (current) use of non-steroidal anti-inflammatories (NSAID): Secondary | ICD-10-CM | POA: Diagnosis not present

## 2018-04-01 DIAGNOSIS — G8929 Other chronic pain: Secondary | ICD-10-CM | POA: Diagnosis not present

## 2018-04-01 DIAGNOSIS — Z7722 Contact with and (suspected) exposure to environmental tobacco smoke (acute) (chronic): Secondary | ICD-10-CM | POA: Diagnosis not present

## 2018-04-01 DIAGNOSIS — J309 Allergic rhinitis, unspecified: Secondary | ICD-10-CM | POA: Diagnosis not present

## 2018-04-01 DIAGNOSIS — Z6839 Body mass index (BMI) 39.0-39.9, adult: Secondary | ICD-10-CM | POA: Diagnosis not present

## 2018-04-01 DIAGNOSIS — Z809 Family history of malignant neoplasm, unspecified: Secondary | ICD-10-CM | POA: Diagnosis not present

## 2018-04-01 DIAGNOSIS — L309 Dermatitis, unspecified: Secondary | ICD-10-CM | POA: Diagnosis not present

## 2018-04-07 ENCOUNTER — Ambulatory Visit (INDEPENDENT_AMBULATORY_CARE_PROVIDER_SITE_OTHER): Payer: Medicare HMO | Admitting: Family Medicine

## 2018-04-07 ENCOUNTER — Encounter: Payer: Self-pay | Admitting: Family Medicine

## 2018-04-07 VITALS — BP 130/80 | Temp 98.1°F | Ht 64.0 in | Wt 228.8 lb

## 2018-04-07 DIAGNOSIS — J310 Chronic rhinitis: Secondary | ICD-10-CM

## 2018-04-07 DIAGNOSIS — J329 Chronic sinusitis, unspecified: Secondary | ICD-10-CM

## 2018-04-07 MED ORDER — DOXYCYCLINE HYCLATE 100 MG PO TABS: 100.0000 mg | ORAL_TABLET | Freq: Two times a day (BID) | ORAL | 0 refills | Status: AC

## 2018-04-07 MED ORDER — FLUTICASONE PROPIONATE 50 MCG/ACT NA SUSP: 2.0000 | Freq: Every day | NASAL | 2 refills | Status: DC

## 2018-04-07 NOTE — Patient Instructions (Signed)
May take over the counter afrin, 2 sprays each nostril, at bedtime for the next 4-5 days.

## 2018-04-07 NOTE — Progress Notes (Signed)
   Subjective:    Patient ID: Julie Brown, female    DOB: 01-Sep-1950, 67 y.o.   MRN: [id number]  Sinus Problem  This is a new problem. The current episode started 1 to 4 weeks ago. Associated symptoms include congestion, coughing, ear pain, headaches and a hoarse voice. Pertinent negatives include no sore throat. Treatments tried: levaquin.    Was seen on 9/24 for same and rx levaquin. Took last dose of levaquin today.  Reports some improvement but still having a lot of nasal congestion, non-productive cough, left ear pain, and frontal sinus pressure. Has been taking dayquil, nyquil, and aspirin.  Review of Systems  Constitutional: Negative for fever.  HENT: Positive for congestion, ear pain and hoarse voice. Negative for sore throat.   Respiratory: Positive for cough and wheezing.   Gastrointestinal: Negative for abdominal pain, diarrhea, nausea and vomiting.  Neurological: Positive for headaches.       Objective:   Physical Exam  Constitutional: She is oriented to person, place, and time. She appears well-developed and well-nourished. No distress.  HENT:  Head: Normocephalic and atraumatic.  Right Ear: Tympanic membrane is not erythematous. A middle ear effusion is present.  Left Ear: Tympanic membrane is not erythematous. A middle ear effusion is present.  Nose: Mucosal edema and sinus tenderness present.  Mouth/Throat: Uvula is midline and oropharynx is clear and moist.  Eyes: Right eye exhibits no discharge. Left eye exhibits no discharge.  Neck: Neck supple.  Cardiovascular: Normal rate, regular rhythm and normal heart sounds.  No murmur heard. Pulmonary/Chest: Effort normal and breath sounds normal. No respiratory distress. She has no wheezes.  Lymphadenopathy:    She has no cervical adenopathy.  Neurological: She is alert and oriented to person, place, and time.  Skin: Skin is warm and dry.  Psychiatric: She has a normal mood and affect.  Nursing note and vitals  reviewed.      Assessment & Plan:  Rhinosinusitis  Recommended symptomatic treatment with afrin at bedtime only, discussed not to overuse medication, and flonase daily as well.  Additional abx prescribed as well d/t possible resistance to the levaquin. Warning signs discussed. Will f/u prn. As attending physician to this patient visit, this patient was seen in conjunction with the nurse practitioner.  The history,physical and treatment plan was reviewed with the nurse practitioner and pertinent findings were verified with the patient.  Also the treatment plan was reviewed with the patient while they were present. WSLMD

## 2018-04-20 DIAGNOSIS — M1711 Unilateral primary osteoarthritis, right knee: Secondary | ICD-10-CM | POA: Diagnosis not present

## 2018-04-28 DIAGNOSIS — M1711 Unilateral primary osteoarthritis, right knee: Secondary | ICD-10-CM | POA: Diagnosis not present

## 2018-05-04 DIAGNOSIS — M25561 Pain in right knee: Secondary | ICD-10-CM | POA: Diagnosis not present

## 2018-05-05 DIAGNOSIS — R69 Illness, unspecified: Secondary | ICD-10-CM | POA: Diagnosis not present

## 2018-06-17 DIAGNOSIS — M1711 Unilateral primary osteoarthritis, right knee: Secondary | ICD-10-CM | POA: Diagnosis not present

## 2018-08-22 ENCOUNTER — Ambulatory Visit (INDEPENDENT_AMBULATORY_CARE_PROVIDER_SITE_OTHER): Payer: Medicare HMO | Admitting: Family Medicine

## 2018-08-22 ENCOUNTER — Encounter: Payer: Self-pay | Admitting: Family Medicine

## 2018-08-22 VITALS — BP 138/90 | Temp 97.7°F | Wt 234.0 lb

## 2018-08-22 DIAGNOSIS — Z780 Asymptomatic menopausal state: Secondary | ICD-10-CM

## 2018-08-22 DIAGNOSIS — Z13820 Encounter for screening for osteoporosis: Secondary | ICD-10-CM

## 2018-08-22 DIAGNOSIS — J329 Chronic sinusitis, unspecified: Secondary | ICD-10-CM

## 2018-08-22 MED ORDER — AMOXICILLIN-POT CLAVULANATE 875-125 MG PO TABS: 1.0000 | ORAL_TABLET | Freq: Two times a day (BID) | ORAL | 0 refills | Status: DC

## 2018-08-22 NOTE — Progress Notes (Signed)
   Subjective:    Patient ID: Julie Brown, female    DOB: [birth year], 68 y.o.   MRN: [id number]  Sinusitis  This is a new problem. The current episode started 1 to 4 weeks ago. The problem has been gradually worsening since onset. There has been no fever. Her pain is at a severity of 5/10. The pain is moderate. Associated symptoms include congestion, coughing, ear pain, headaches and sinus pressure. Pertinent negatives include no neck pain, shortness of breath, sneezing, sore throat or swollen glands. (Wheezing, awful taste in mouth. ) Past treatments include saline sprays (nyquil, flonase, ibuprofen). The treatment provided mild relief.   PMH benign denies any chest tightness pressure pain shortness of breath high fever chills   Review of Systems  Constitutional: Negative for activity change and fever.  HENT: Positive for congestion, ear pain, rhinorrhea and sinus pressure. Negative for sneezing and sore throat.   Eyes: Negative for discharge.  Respiratory: Positive for cough. Negative for shortness of breath and wheezing.   Cardiovascular: Negative for chest pain.  Musculoskeletal: Negative for neck pain.  Neurological: Positive for headaches.       Objective:   Physical Exam Vitals signs and nursing note reviewed.  Constitutional:      Appearance: She is well-developed.  HENT:     Head: Normocephalic.     Nose: Nose normal.     Mouth/Throat:     Pharynx: No oropharyngeal exudate.  Neck:     Musculoskeletal: Neck supple.  Cardiovascular:     Rate and Rhythm: Normal rate.     Heart sounds: Normal heart sounds. No murmur.  Pulmonary:     Effort: Pulmonary effort is normal.     Breath sounds: Normal breath sounds. No wheezing.  Lymphadenopathy:     Cervical: No cervical adenopathy.  Skin:    General: Skin is warm and dry.           Assessment & Plan:  Patient was seen today for upper respiratory illness. It is felt that the patient is dealing with sinusitis.    Antibiotics were prescribed today. Importance of compliance with medication was discussed.  Symptoms should gradually resolve over the course of the next several days. If high fevers, progressive illness, difficulty breathing, worsening condition or failure for symptoms to improve over the next several days then the patient is to follow-up.  If any emergent conditions the patient is to follow-up in the emergency department otherwise to follow-up in the office.  15 minutes was spent with patient today discussing healthcare issues which they came.  More than 50% of this visit-total duration of visit-was spent in counseling and coordination of care.  Please see diagnosis regarding the focus of this coordination and care

## 2018-08-30 ENCOUNTER — Ambulatory Visit (INDEPENDENT_AMBULATORY_CARE_PROVIDER_SITE_OTHER): Payer: Medicare HMO | Admitting: Family Medicine

## 2018-08-30 ENCOUNTER — Encounter: Payer: Self-pay | Admitting: Family Medicine

## 2018-08-30 VITALS — BP 132/90 | Temp 98.6°F | Ht 64.0 in | Wt 235.0 lb

## 2018-08-30 DIAGNOSIS — J019 Acute sinusitis, unspecified: Secondary | ICD-10-CM | POA: Diagnosis not present

## 2018-08-30 DIAGNOSIS — B9689 Other specified bacterial agents as the cause of diseases classified elsewhere: Secondary | ICD-10-CM | POA: Diagnosis not present

## 2018-08-30 MED ORDER — LEVOFLOXACIN 500 MG PO TABS: 500.0000 mg | ORAL_TABLET | Freq: Every day | ORAL | 0 refills | Status: DC

## 2018-08-30 NOTE — Progress Notes (Signed)
   Subjective:    Patient ID: Julie Brown, female    DOB: 10-Jul-1950, 68 y.o.   MRN: [id number]  Sinusitis  This is a new problem. Episode onset: 2 weeks ago. Associated symptoms include congestion, coughing, ear pain, headaches and sinus pressure. Pertinent negatives include no shortness of breath. Treatments tried: currently on augmentin.   Sinus congestion drainage coughing sinus pressure pain discomfort denies wheezing difficulty breathing   Review of Systems  Constitutional: Negative for activity change and fever.  HENT: Positive for congestion, ear pain, rhinorrhea and sinus pressure.   Eyes: Negative for discharge.  Respiratory: Positive for cough. Negative for shortness of breath and wheezing.   Cardiovascular: Negative for chest pain.  Neurological: Positive for headaches.       Objective:   Physical Exam Vitals signs and nursing note reviewed.  Constitutional:      Appearance: She is well-developed.  HENT:     Head: Normocephalic.     Nose: Nose normal.     Mouth/Throat:     Pharynx: No oropharyngeal exudate.  Neck:     Musculoskeletal: Neck supple.  Cardiovascular:     Rate and Rhythm: Normal rate.     Heart sounds: Normal heart sounds. No murmur.  Pulmonary:     Effort: Pulmonary effort is normal.     Breath sounds: Normal breath sounds. No wheezing.  Lymphadenopathy:     Cervical: No cervical adenopathy.  Skin:    General: Skin is warm and dry.           Assessment & Plan:  Patient was seen today for upper respiratory illness. It is felt that the patient is dealing with sinusitis.  Antibiotics were prescribed today. Importance of compliance with medication was discussed.  Symptoms should gradually resolve over the course of the next several days. If high fevers, progressive illness, difficulty breathing, worsening condition or failure for symptoms to improve over the next several days then the patient is to follow-up.  If any emergent conditions  the patient is to follow-up in the emergency department otherwise to follow-up in the office.  Stop Augmentin Start Levaquin 1 daily May use Afrin nasal spray for 3 to 4 days Follow-up if progressive troubles

## 2018-08-31 ENCOUNTER — Ambulatory Visit (HOSPITAL_COMMUNITY)
Admission: RE | Admit: 2018-08-31 | Discharge: 2018-08-31 | Disposition: A | Discharge status: Home / Self Care | Payer: Medicare HMO | Source: Ambulatory Visit | Attending: Family Medicine | Admitting: Family Medicine

## 2018-08-31 DIAGNOSIS — Z780 Asymptomatic menopausal state: Secondary | ICD-10-CM | POA: Diagnosis not present

## 2018-08-31 DIAGNOSIS — Z13820 Encounter for screening for osteoporosis: Secondary | ICD-10-CM | POA: Diagnosis not present

## 2018-12-12 ENCOUNTER — Telehealth: Payer: Self-pay | Admitting: *Deleted

## 2018-12-12 NOTE — Telephone Encounter (Signed)
Pt sent message for appt through mychart. Copied below  From: Marye Round  Sent: 12/12/2018  9:53 AM EDT  To: [facility]  Subject: Appointment Request                 Appointment Request From: Marye Round    With Provider: Mickie Hillier, MD [facility]]    Preferred Date Range: 12/16/2018 - 12/19/2018    Preferred Times: Any time    Reason for visit: Office Visit    Comments:  Have had left side pain and ache under shoulder blade for 5 weeks, getting worse, ice packs help for a while. My mother and brother both had kidneys removed due to cancer. I am concerned this could be kidney cancer.   I called pt to get more info. She states she is having an ache on lower left side of back and her brother and mother both had the same ache before finding out they had cancer. She wanted to wait til Friday to come in. Pt advised if pain got worse to call us and not wait til Friday. appt made for pt

## 2018-12-16 ENCOUNTER — Encounter: Payer: Self-pay | Admitting: Family Medicine

## 2018-12-16 ENCOUNTER — Ambulatory Visit (INDEPENDENT_AMBULATORY_CARE_PROVIDER_SITE_OTHER): Payer: Medicare HMO | Admitting: Family Medicine

## 2018-12-16 ENCOUNTER — Other Ambulatory Visit: Payer: Medicare HMO

## 2018-12-16 ENCOUNTER — Telehealth: Payer: Self-pay

## 2018-12-16 ENCOUNTER — Telehealth: Payer: Self-pay | Admitting: Family Medicine

## 2018-12-16 ENCOUNTER — Other Ambulatory Visit: Payer: Self-pay

## 2018-12-16 VITALS — Temp 98.2°F | Ht 64.0 in | Wt 238.0 lb

## 2018-12-16 DIAGNOSIS — R109 Unspecified abdominal pain: Secondary | ICD-10-CM | POA: Diagnosis not present

## 2018-12-16 DIAGNOSIS — Z20822 Contact with and (suspected) exposure to covid-19: Secondary | ICD-10-CM

## 2018-12-16 DIAGNOSIS — R6889 Other general symptoms and signs: Secondary | ICD-10-CM | POA: Diagnosis not present

## 2018-12-16 MED ORDER — ETODOLAC 400 MG PO TABS: ORAL_TABLET | ORAL | 0 refills | Status: DC

## 2018-12-16 MED ORDER — CEFDINIR 300 MG PO CAPS: 300.0000 mg | ORAL_CAPSULE | Freq: Two times a day (BID) | ORAL | 0 refills | Status: DC

## 2018-12-16 NOTE — Telephone Encounter (Signed)
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

## 2018-12-16 NOTE — Progress Notes (Signed)
   Subjective:    Patient ID: Julie Brown, female    DOB: Jun 02, 1951, 68 y.o.   MRN: [id number] Audio plus video Back Pain This is a new problem. Episode onset: 6 weeks. Radiates to: shoulder blades. She has tried ice and NSAIDs for the symptoms. The treatment provided mild relief.    Patient notes upper mid back pain.  Left side and right.  Left side more so.  No obvious urinary symptoms.  Patient concerned because of strong family history of renal cancer.  Has occurred in numerous relatives Results for orders placed or performed in visit on 11/08/17  Hemoglobin A1C  Result Value Ref Range   Hgb A1c MFr Bld 5.2 4.8 - 5.6 %   Est. average glucose Bld gHb Est-mCnc 103 mg/dL  Cytology - PAP  Result Value Ref Range   Adequacy      Satisfactory for evaluation  endocervical/transformation zone component PRESENT.   Diagnosis      NEGATIVE FOR INTRAEPITHELIAL LESIONS OR MALIGNANCY.   HPV NOT DETECTED    Material Submitted CervicoVaginal Pap [ThinPrep Imaged]    CYTOLOGY - PAP PAP RESULT       Review of Systems  Musculoskeletal: Positive for back pain.  No fever no chills     Objective:   Physical Exam   Virtual    Assessment & Plan:  Impression subacute back pain more so on left.  Likely musculoskeletal.  Discussed.  However strong family history of renal cancer and greater than 1 month duration of symptoms.  Will cover with anti-inflammatory but also will schedule ultrasound of the kidneys.  Further recommendations based on results

## 2018-12-16 NOTE — Telephone Encounter (Signed)
Please advise. Thank you

## 2018-12-16 NOTE — Telephone Encounter (Signed)
Medication sent in and pt is aware  

## 2018-12-16 NOTE — Addendum Note (Signed)
Addended by: Vicente Males on: 12/16/2018 03:18 PM   Modules accepted: Orders

## 2018-12-16 NOTE — Telephone Encounter (Signed)
Sure, order,

## 2018-12-16 NOTE — Telephone Encounter (Signed)
Scheduled for today.

## 2018-12-16 NOTE — Telephone Encounter (Signed)
Patient called and advised of the request for covid testing by Dr. Wolfgang Phoenix, she verbalized understanding. Appointment scheduled for today at 1530 at [facility], advised of the location and to wear a mask for everyone in the vehicle, she verbalized understanding. Order placed.

## 2018-12-16 NOTE — Telephone Encounter (Signed)
Pt is needing COVID testing. Thank you

## 2018-12-16 NOTE — Telephone Encounter (Signed)
Contacted patient regarding ordering a COVID test; pt states pharmacy is stating that Julie Brown only has one script. Pt states  Provider was going to send in something for back pain. Please advise. Thank you

## 2018-12-16 NOTE — Telephone Encounter (Signed)
lodine 400 one bid with food prn pain 28

## 2018-12-18 LAB — NOVEL CORONAVIRUS, NAA: SARS-CoV-2, NAA: NOT DETECTED

## 2018-12-19 ENCOUNTER — Encounter: Payer: Self-pay | Admitting: Family Medicine

## 2018-12-26 ENCOUNTER — Ambulatory Visit (HOSPITAL_COMMUNITY)
Admission: RE | Admit: 2018-12-26 | Discharge: 2018-12-26 | Disposition: A | Discharge status: Home / Self Care | Payer: Medicare HMO | Source: Ambulatory Visit | Attending: Family Medicine | Admitting: Family Medicine

## 2018-12-26 ENCOUNTER — Other Ambulatory Visit: Payer: Self-pay

## 2018-12-26 DIAGNOSIS — R109 Unspecified abdominal pain: Secondary | ICD-10-CM | POA: Insufficient documentation

## 2019-01-02 ENCOUNTER — Other Ambulatory Visit: Payer: Self-pay | Admitting: Family Medicine

## 2019-01-02 ENCOUNTER — Other Ambulatory Visit (HOSPITAL_COMMUNITY): Payer: Self-pay | Admitting: Obstetrics & Gynecology

## 2019-01-02 DIAGNOSIS — Z1231 Encounter for screening mammogram for malignant neoplasm of breast: Secondary | ICD-10-CM

## 2019-01-02 MED ORDER — AMOXICILLIN-POT CLAVULANATE 875-125 MG PO TABS: ORAL_TABLET | ORAL | 0 refills | Status: DC

## 2019-01-02 NOTE — Addendum Note (Signed)
Addended by: Vicente Males on: 01/02/2019 05:10 PM   Modules accepted: Orders

## 2019-01-02 NOTE — Addendum Note (Signed)
Addended by: Vicente Males on: 01/02/2019 11:37 AM   Modules accepted: Orders

## 2019-01-03 MED ORDER — ETODOLAC 400 MG PO TABS: ORAL_TABLET | ORAL | 0 refills | Status: DC

## 2019-01-04 DIAGNOSIS — R109 Unspecified abdominal pain: Secondary | ICD-10-CM | POA: Diagnosis not present

## 2019-01-05 LAB — HEPATIC FUNCTION PANEL
ALT: 49 IU/L — ABNORMAL HIGH (ref 0–32)
AST: 31 IU/L (ref 0–40)
Albumin: 4.4 g/dL (ref 3.8–4.8)
Alkaline Phosphatase: 121 IU/L — ABNORMAL HIGH (ref 39–117)
Bilirubin Total: 0.5 mg/dL (ref 0.0–1.2)
Bilirubin, Direct: 0.12 mg/dL (ref 0.00–0.40)
Total Protein: 6.6 g/dL (ref 6.0–8.5)

## 2019-01-05 LAB — BASIC METABOLIC PANEL
BUN/Creatinine Ratio: 20 (ref 12–28)
BUN: 18 mg/dL (ref 8–27)
CO2: 22 mmol/L (ref 20–29)
Calcium: 9.6 mg/dL (ref 8.7–10.3)
Chloride: 104 mmol/L (ref 96–106)
Creatinine, Ser: 0.91 mg/dL (ref 0.57–1.00)
GFR calc Af Amer: 76 mL/min/{1.73_m2} (ref >59)
GFR calc non Af Amer: 66 mL/min/{1.73_m2} (ref >59)
Glucose: 119 mg/dL — ABNORMAL HIGH (ref 65–99)
Potassium: 4.3 mmol/L (ref 3.5–5.2)
Sodium: 144 mmol/L (ref 134–144)

## 2019-01-16 ENCOUNTER — Encounter: Payer: Self-pay | Admitting: Family Medicine

## 2019-02-15 ENCOUNTER — Encounter: Payer: Self-pay | Admitting: Family Medicine

## 2019-02-16 NOTE — Telephone Encounter (Signed)
Certainly we are happy to help   in order for me to not only look but also document and also properly advise any patient-a separate office visit would be necessary.  If she would like to be seen on Friday.  I would request that we also have her as an office visit-please have them come at 1020 rather than 10 AM and then that way we could address both of their needs  Nurses- talk with patient-if she is interested in being seen-have the front put Judi at the 1130 visit even though we will be seeing her sooner  If she is not interested to be seen on Friday we can accommodate her with Dr. Richardson Landry next week

## 2019-02-17 ENCOUNTER — Ambulatory Visit (INDEPENDENT_AMBULATORY_CARE_PROVIDER_SITE_OTHER): Payer: Medicare HMO | Admitting: Family Medicine

## 2019-02-17 ENCOUNTER — Other Ambulatory Visit: Payer: Self-pay

## 2019-02-17 ENCOUNTER — Encounter: Payer: Self-pay | Admitting: Family Medicine

## 2019-02-17 VITALS — Temp 97.3°F | Ht 64.0 in | Wt 238.0 lb

## 2019-02-17 DIAGNOSIS — R6884 Jaw pain: Secondary | ICD-10-CM | POA: Diagnosis not present

## 2019-02-17 MED ORDER — ETODOLAC 400 MG PO TABS: ORAL_TABLET | ORAL | 0 refills | Status: DC

## 2019-02-17 NOTE — Progress Notes (Signed)
   Subjective:    Patient ID: Julie Brown, female    DOB: July 10, 1950, 68 y.o.   MRN: [id number]  HPI  Patient arrives with right ear anf jaw pain for 4 days.  Patient states her past several days ear pain discomfort.  Denies any fever chills sweats denies any sinus pain denies any drainage cough wheezing no dysphagia.  The pain is in the right ear region around the jaw region radiates into the upper neck anteriorly.  PMH benign has not tried anything for it does chew a lot of gum Review of Systems  Constitutional: Negative for activity change and fever.  HENT: Negative for congestion, ear pain and rhinorrhea.   Eyes: Negative for discharge.  Respiratory: Negative for cough, shortness of breath and wheezing.   Cardiovascular: Negative for chest pain and leg swelling.       Objective:   Physical Exam Vitals signs and nursing note reviewed.  Constitutional:      Appearance: She is well-developed.  HENT:     Head: Normocephalic.     Nose: Nose normal.     Mouth/Throat:     Pharynx: No oropharyngeal exudate.  Neck:     Musculoskeletal: Neck supple.  Cardiovascular:     Rate and Rhythm: Normal rate.     Heart sounds: Normal heart sounds. No murmur.  Pulmonary:     Effort: Pulmonary effort is normal.     Breath sounds: Normal breath sounds. No wheezing.  Lymphadenopathy:     Cervical: No cervical adenopathy.  Skin:    General: Skin is warm and dry.     15 minutes was spent with patient today discussing healthcare issues which they came.  More than 50% of this visit-total duration of visit-was spent in counseling and coordination of care.  Please see diagnosis regarding the focus of this coordination and care       Assessment & Plan:  I think this is probably more musculoskeletal related to the jaw. Avoid chewing gum May use Lodine twice daily over the next 7 to 10 days if ongoing troubles follow-up Currently I do not feel referral to ENT necessary but certainly if it  persists or worsens ENT referral would be necessary no sign of any growth or cancer noted on today's exam I doubt sinus infection I doubt COVID

## 2019-03-31 DIAGNOSIS — R69 Illness, unspecified: Secondary | ICD-10-CM | POA: Diagnosis not present

## 2019-04-05 DIAGNOSIS — D229 Melanocytic nevi, unspecified: Secondary | ICD-10-CM | POA: Diagnosis not present

## 2019-04-05 DIAGNOSIS — L209 Atopic dermatitis, unspecified: Secondary | ICD-10-CM | POA: Diagnosis not present

## 2019-05-22 ENCOUNTER — Other Ambulatory Visit: Payer: Self-pay

## 2019-05-22 ENCOUNTER — Other Ambulatory Visit: Payer: Self-pay | Admitting: Podiatry

## 2019-05-22 ENCOUNTER — Ambulatory Visit: Payer: Medicare HMO | Admitting: Podiatry

## 2019-05-22 ENCOUNTER — Ambulatory Visit (INDEPENDENT_AMBULATORY_CARE_PROVIDER_SITE_OTHER): Payer: Medicare HMO

## 2019-05-22 ENCOUNTER — Encounter: Payer: Self-pay | Admitting: Podiatry

## 2019-05-22 VITALS — BP 149/76

## 2019-05-22 DIAGNOSIS — L600 Ingrowing nail: Secondary | ICD-10-CM

## 2019-05-22 DIAGNOSIS — M722 Plantar fascial fibromatosis: Secondary | ICD-10-CM

## 2019-05-22 DIAGNOSIS — M79671 Pain in right foot: Secondary | ICD-10-CM | POA: Diagnosis not present

## 2019-05-22 MED ORDER — NEOMYCIN-POLYMYXIN-HC 3.5-10000-1 OT SOLN: OTIC | 0 refills | Status: DC

## 2019-05-22 NOTE — Patient Instructions (Addendum)
Soak Instructions    THE DAY AFTER THE PROCEDURE  Place 1/4 cup of epsom salts in a quart of warm tap water.  Submerge your foot or feet with outer bandage intact for the initial soak; this will allow the bandage to become moist and wet for easy lift off.  Once you remove your bandage, continue to soak in the solution for 20 minutes.  This soak should be done twice a day.  Next, remove your foot or feet from solution, blot dry the affected area and cover.  You may use a band aid large enough to cover the area or use gauze and tape.  Apply other medications to the area as directed by the doctor such as polysporin neosporin.  IF YOUR SKIN BECOMES IRRITATED WHILE USING THESE INSTRUCTIONS, IT IS OKAY TO SWITCH TO  WHITE VINEGAR AND WATER. Or you may use antibacterial soap and water to keep the toe clean  Monitor for any signs/symptoms of infection. Call the office immediately if any occur or go directly to the emergency room. Call with any questions/concerns.    Long Term Care Instructions-Post Nail Surgery  You have had your ingrown toenail and root treated with a chemical.  This chemical causes a burn that will drain and ooze like a blister.  This can drain for 6-8 weeks or longer.  It is important to keep this area clean, covered, and follow the soaking instructions dispensed at the time of your surgery.  This area will eventually dry and form a scab.  Once the scab forms you no longer need to soak or apply a dressing.  If at any time you experience an increase in pain, redness, swelling, or drainage, you should contact the office as soon as possible.  Plantar Fasciitis (Heel Spur Syndrome) with Rehab The plantar fascia is a fibrous, ligament-like, soft-tissue structure that spans the bottom of the foot. Plantar fasciitis is a condition that causes pain in the foot due to inflammation of the tissue. SYMPTOMS   Pain and tenderness on the underneath side of the foot.  Pain that worsens with  standing or walking. CAUSES  Plantar fasciitis is caused by irritation and injury to the plantar fascia on the underneath side of the foot. Common mechanisms of injury include:  Direct trauma to bottom of the foot.  Damage to a small nerve that runs under the foot where the main fascia attaches to the heel bone.  Stress placed on the plantar fascia due to bone spurs. RISK INCREASES WITH:   Activities that place stress on the plantar fascia (running, jumping, pivoting, or cutting).  Poor strength and flexibility.  Improperly fitted shoes.  Tight calf muscles.  Flat feet.  Failure to warm-up properly before activity.  Obesity. PREVENTION  Warm up and stretch properly before activity.  Allow for adequate recovery between workouts.  Maintain physical fitness:  Strength, flexibility, and endurance.  Cardiovascular fitness.  Maintain a health body weight.  Avoid stress on the plantar fascia.  Wear properly fitted shoes, including arch supports for individuals who have flat feet.  PROGNOSIS  If treated properly, then the symptoms of plantar fasciitis usually resolve without surgery. However, occasionally surgery is necessary.  RELATED COMPLICATIONS   Recurrent symptoms that may result in a chronic condition.  Problems of the lower back that are caused by compensating for the injury, such as limping.  Pain or weakness of the foot during push-off following surgery.  Chronic inflammation, scarring, and partial or complete fascia tear,   occurring more often from repeated injections.  TREATMENT  Treatment initially involves the use of ice and medication to help reduce pain and inflammation. The use of strengthening and stretching exercises may help reduce pain with activity, especially stretches of the Achilles tendon. These exercises may be performed at home or with a therapist. Your caregiver may recommend that you use heel cups of arch supports to help reduce stress on  the plantar fascia. Occasionally, corticosteroid injections are given to reduce inflammation. If symptoms persist for greater than 6 months despite non-surgical (conservative), then surgery may be recommended.   MEDICATION   If pain medication is necessary, then nonsteroidal anti-inflammatory medications, such as aspirin and ibuprofen, or other minor pain relievers, such as acetaminophen, are often recommended.  Do not take pain medication within 7 days before surgery.  Prescription pain relievers may be given if deemed necessary by your caregiver. Use only as directed and only as much as you need.  Corticosteroid injections may be given by your caregiver. These injections should be reserved for the most serious cases, because they may only be given a certain number of times.  HEAT AND COLD  Cold treatment (icing) relieves pain and reduces inflammation. Cold treatment should be applied for 10 to 15 minutes every 2 to 3 hours for inflammation and pain and immediately after any activity that aggravates your symptoms. Use ice packs or massage the area with a piece of ice (ice massage).  Heat treatment may be used prior to performing the stretching and strengthening activities prescribed by your caregiver, physical therapist, or athletic trainer. Use a heat pack or soak the injury in warm water.  SEEK IMMEDIATE MEDICAL CARE IF:  Treatment seems to offer no benefit, or the condition worsens.  Any medications produce adverse side effects.  EXERCISES- RANGE OF MOTION (ROM) AND STRETCHING EXERCISES - Plantar Fasciitis (Heel Spur Syndrome) These exercises may help you when beginning to rehabilitate your injury. Your symptoms may resolve with or without further involvement from your physician, physical therapist or athletic trainer. While completing these exercises, remember:   Restoring tissue flexibility helps normal motion to return to the joints. This allows healthier, less painful movement and  activity.  An effective stretch should be held for at least 30 seconds.  A stretch should never be painful. You should only feel a gentle lengthening or release in the stretched tissue.  RANGE OF MOTION - Toe Extension, Flexion  Sit with your right / left leg crossed over your opposite knee.  Grasp your toes and gently pull them back toward the top of your foot. You should feel a stretch on the bottom of your toes and/or foot.  Hold this stretch for 10 seconds.  Now, gently pull your toes toward the bottom of your foot. You should feel a stretch on the top of your toes and or foot.  Hold this stretch for 10 seconds. Repeat  times. Complete this stretch 3 times per day.   RANGE OF MOTION - Ankle Dorsiflexion, Active Assisted  Remove shoes and sit on a chair that is preferably not on a carpeted surface.  Place right / left foot under knee. Extend your opposite leg for support.  Keeping your heel down, slide your right / left foot back toward the chair until you feel a stretch at your ankle or calf. If you do not feel a stretch, slide your bottom forward to the edge of the chair, while still keeping your heel down.  Hold this stretch   for 10 seconds. Repeat 3 times. Complete this stretch 2 times per day.   STRETCH  Gastroc, Standing  Place hands on wall.  Extend right / left leg, keeping the front knee somewhat bent.  Slightly point your toes inward on your back foot.  Keeping your right / left heel on the floor and your knee straight, shift your weight toward the wall, not allowing your back to arch.  You should feel a gentle stretch in the right / left calf. Hold this position for 10 seconds. Repeat 3 times. Complete this stretch 2 times per day.  STRETCH  Soleus, Standing  Place hands on wall.  Extend right / left leg, keeping the other knee somewhat bent.  Slightly point your toes inward on your back foot.  Keep your right / left heel on the floor, bend your back  knee, and slightly shift your weight over the back leg so that you feel a gentle stretch deep in your back calf.  Hold this position for 10 seconds. Repeat 3 times. Complete this stretch 2 times per day.  STRETCH  Gastrocsoleus, Standing  Note: This exercise can place a lot of stress on your foot and ankle. Please complete this exercise only if specifically instructed by your caregiver.   Place the ball of your right / left foot on a step, keeping your other foot firmly on the same step.  Hold on to the wall or a rail for balance.  Slowly lift your other foot, allowing your body weight to press your heel down over the edge of the step.  You should feel a stretch in your right / left calf.  Hold this position for 10 seconds.  Repeat this exercise with a slight bend in your right / left knee. Repeat 3 times. Complete this stretch 2 times per day.   STRENGTHENING EXERCISES - Plantar Fasciitis (Heel Spur Syndrome)  These exercises may help you when beginning to rehabilitate your injury. They may resolve your symptoms with or without further involvement from your physician, physical therapist or athletic trainer. While completing these exercises, remember:   Muscles can gain both the endurance and the strength needed for everyday activities through controlled exercises.  Complete these exercises as instructed by your physician, physical therapist or athletic trainer. Progress the resistance and repetitions only as guided.  STRENGTH - Towel Curls  Sit in a chair positioned on a non-carpeted surface.  Place your foot on a towel, keeping your heel on the floor.  Pull the towel toward your heel by only curling your toes. Keep your heel on the floor. Repeat 3 times. Complete this exercise 2 times per day.  STRENGTH - Ankle Inversion  Secure one end of a rubber exercise band/tubing to a fixed object (table, pole). Loop the other end around your foot just before your toes.  Place your  fists between your knees. This will focus your strengthening at your ankle.  Slowly, pull your big toe up and in, making sure the band/tubing is positioned to resist the entire motion.  Hold this position for 10 seconds.  Have your muscles resist the band/tubing as it slowly pulls your foot back to the starting position. Repeat 3 times. Complete this exercises 2 times per day.  Document Released: 06/22/2005 Document Revised: 09/14/2011 Document Reviewed: 10/04/2008 [employer] Patient Information 2014 [employer]. 

## 2019-05-24 NOTE — Progress Notes (Signed)
Subjective:   Patient ID: Julie Brown, female   DOB: 68 y.o.   MRN: [id number]   HPI Patient presents stating that she has several problems with one being pain on the bottom of the right heel that is been going on around 6 months and also chronic ingrown toenails of the big toes of both feet that have been sore and making it hard for her to wear shoe gear comfortably.  She is tried to trim them soak them without relief of symptoms and patient does not smoke likes to be active   Review of Systems  All other systems reviewed and are negative.       Objective:  Physical Exam Vitals signs and nursing note reviewed.  Constitutional:      Appearance: She is well-developed.  Pulmonary:     Effort: Pulmonary effort is normal.  Musculoskeletal: Normal range of motion.  Skin:    General: Skin is warm.  Neurological:     Mental Status: She is alert.     Neurovascular status found to be intact with muscle strength adequate range of motion within normal limits.  Patient is noted to have incurvated hallux nail borders right and left toes medial border that are painful with no drainage no redness noted.  Patient is noted also to have exquisite discomfort plantar aspect right heel at the insertional point of the tendon into the calcaneus with inflammation fluid buildup around the medial band     Assessment:  Acute plantar fasciitis right along with chronic ingrown toenail deformity of the hallux bilateral     Plan:  H&P x-rays reviewed reviewed both conditions and discussed.  At this point I recommended correction of the ingrown toenails and patient was given consent form reading consent form understanding risk.  Today I infiltrated each hallux 60 mg Xylocaine Marcaine mixture I under sterile technique remove the medial border exposed matrix and applied phenol 3 applications 30 seconds followed by alcohol lavage and sterile dressing.  I then did sterile prep and injected the fascia right 3 mg  Kenalog 5 mg Xylocaine applied fascial brace and instructed on shoe gear modification and stretching exercises.  Reappoint to recheck again in the next several weeks  X-rays indicate that there is small spur with mild osteoporosis arthritis and no indication of stress fracture

## 2019-06-12 ENCOUNTER — Ambulatory Visit (INDEPENDENT_AMBULATORY_CARE_PROVIDER_SITE_OTHER): Payer: Medicare HMO | Admitting: Podiatry

## 2019-06-12 ENCOUNTER — Other Ambulatory Visit: Payer: Self-pay

## 2019-06-12 ENCOUNTER — Encounter: Payer: Self-pay | Admitting: Podiatry

## 2019-06-12 DIAGNOSIS — M722 Plantar fascial fibromatosis: Secondary | ICD-10-CM | POA: Diagnosis not present

## 2019-06-12 DIAGNOSIS — L600 Ingrowing nail: Secondary | ICD-10-CM | POA: Diagnosis not present

## 2019-06-12 NOTE — Progress Notes (Signed)
Subjective:   Patient ID: Julie Brown, female   DOB: 68 y.o.   MRN: [id number]   HPI Patient states she is doing quite a bit better with her plantar fasciitis with mild discomfort still noted and states the ingrown toenails are healing but she does have some looseness of the right hallux nail   ROS      Objective:  Physical Exam  Neurovascular status intact with patient found to have exquisite discomfort that has reduced in the plantar fascial right with pain still noted only upon deep palpation and a damaged right hallux nail that the distal two thirds of the nailbed have loosened and are thick and on each inside corner she is doing better     Assessment:  Doing well post plantar fasciitis removal of ingrown toenails H     Plan:  NP conditions reviewed and at this point I have recommended continued physical therapy anti-inflammatories and I debrided the distal two thirds of the nail right and advised on continued soaking and reappoint as needed

## 2019-08-10 ENCOUNTER — Encounter: Payer: Self-pay | Admitting: Family Medicine

## 2019-08-28 ENCOUNTER — Encounter: Payer: Self-pay | Admitting: *Deleted

## 2019-09-21 ENCOUNTER — Encounter: Payer: Self-pay | Admitting: Podiatry

## 2019-10-04 ENCOUNTER — Other Ambulatory Visit: Payer: Self-pay

## 2019-10-04 ENCOUNTER — Encounter: Payer: Self-pay | Admitting: Podiatry

## 2019-10-04 ENCOUNTER — Ambulatory Visit: Payer: Medicare HMO | Admitting: Podiatry

## 2019-10-04 VITALS — Temp 98.0°F

## 2019-10-04 DIAGNOSIS — L600 Ingrowing nail: Secondary | ICD-10-CM

## 2019-10-04 NOTE — Progress Notes (Signed)
Subjective:   Patient ID: Julie Brown, female   DOB: 69 y.o.   MRN: [id number]   HPI Patient presents stating she had ingrown's removed out of the corners does seem to be okay but the top of the nails on both are bothering her especially the right   ROS      Objective:  Physical Exam  Neurovascular status intact with patient's nail corners doing well but there is thickness of the hallux nail bilateral with generalized damage to the nail plates     Assessment:  Chronic nail damage hallux bilateral that despite removal of the corners remained symptomatic     Plan:  H&P reviewed condition and today sterile sharp debridement accomplished with no iatrogenic bleeding and I then discussed permanent nail removal which she wants done but cannot do today and she will reappoint for visit to get this procedure performed

## 2019-10-13 DIAGNOSIS — R32 Unspecified urinary incontinence: Secondary | ICD-10-CM | POA: Diagnosis not present

## 2019-10-13 DIAGNOSIS — Z6841 Body Mass Index (BMI) 40.0 and over, adult: Secondary | ICD-10-CM | POA: Diagnosis not present

## 2019-10-13 DIAGNOSIS — M1990 Unspecified osteoarthritis, unspecified site: Secondary | ICD-10-CM | POA: Diagnosis not present

## 2019-10-13 DIAGNOSIS — Z809 Family history of malignant neoplasm, unspecified: Secondary | ICD-10-CM | POA: Diagnosis not present

## 2019-10-13 DIAGNOSIS — L309 Dermatitis, unspecified: Secondary | ICD-10-CM | POA: Diagnosis not present

## 2019-10-13 DIAGNOSIS — R030 Elevated blood-pressure reading, without diagnosis of hypertension: Secondary | ICD-10-CM | POA: Diagnosis not present

## 2019-10-13 DIAGNOSIS — J309 Allergic rhinitis, unspecified: Secondary | ICD-10-CM | POA: Diagnosis not present

## 2019-10-13 DIAGNOSIS — Z8249 Family history of ischemic heart disease and other diseases of the circulatory system: Secondary | ICD-10-CM | POA: Diagnosis not present

## 2019-10-13 DIAGNOSIS — Z7722 Contact with and (suspected) exposure to environmental tobacco smoke (acute) (chronic): Secondary | ICD-10-CM | POA: Diagnosis not present

## 2019-10-17 ENCOUNTER — Ambulatory Visit (INDEPENDENT_AMBULATORY_CARE_PROVIDER_SITE_OTHER): Payer: Medicare HMO | Admitting: Family Medicine

## 2019-10-17 ENCOUNTER — Encounter: Payer: Self-pay | Admitting: Family Medicine

## 2019-10-17 ENCOUNTER — Other Ambulatory Visit: Payer: Self-pay

## 2019-10-17 VITALS — BP 124/86 | Temp 97.2°F | Wt 240.8 lb

## 2019-10-17 DIAGNOSIS — I878 Other specified disorders of veins: Secondary | ICD-10-CM | POA: Diagnosis not present

## 2019-10-17 MED ORDER — TRIAMTERENE-HCTZ 37.5-25 MG PO CAPS: ORAL_CAPSULE | ORAL | 11 refills | Status: DC

## 2019-10-17 MED ORDER — SHINGRIX 50 MCG/0.5ML IM SUSR: 0.5000 mL | Freq: Once | INTRAMUSCULAR | 1 refills | Status: AC

## 2019-10-17 NOTE — Progress Notes (Signed)
   Subjective:    Patient ID: Julie Brown, female    DOB: 06/15/1951, 69 y.o.   MRN: [id number]  HPI Pt here today for ankle swelling. Pt states she saw Hoyle Sauer about 4 years for ankle swelling. Pt states left ankle is worse that right ankle. Pt states she has pain when turning over in bed. Pt does elevate feet after work.   Left leg swelling   Seen in the past with it  Fluid pills helped  In the past   nw it d back  Review of Systems No headache no chest pain no shortness of breath    Objective:   Physical Exam  Alert vitals stable, NAD. Blood pressure good on repeat. HEENT normal. Lungs clear. Heart regular rate and rhythm. Ankles trace to 1+ edema      Assessment & Plan:  Impression chronic venous stasis.  Discussed.  Aggravated by age and weight.  Exercise encouraged.  Lower salt intake.  Consider over the counter compression stockings.  Dyazide prescribed may use up to 4 times per week.  Try to avoid daily use

## 2019-10-22 MED ORDER — TRIAMTERENE-HCTZ 37.5-25 MG PO CAPS: ORAL_CAPSULE | ORAL | 11 refills | Status: DC

## 2019-11-03 ENCOUNTER — Ambulatory Visit: Payer: Medicare HMO | Admitting: Podiatry

## 2019-11-17 ENCOUNTER — Other Ambulatory Visit: Payer: Self-pay | Admitting: *Deleted

## 2019-11-17 MED ORDER — DUPIXENT 300 MG/2ML ~~LOC~~ SOSY: 300.0000 mg | PREFILLED_SYRINGE | SUBCUTANEOUS | 3 refills | Status: DC

## 2019-12-27 ENCOUNTER — Other Ambulatory Visit (HOSPITAL_COMMUNITY): Payer: Self-pay | Admitting: Obstetrics & Gynecology

## 2019-12-27 DIAGNOSIS — Z1231 Encounter for screening mammogram for malignant neoplasm of breast: Secondary | ICD-10-CM

## 2020-01-22 ENCOUNTER — Ambulatory Visit (HOSPITAL_COMMUNITY)
Admission: RE | Admit: 2020-01-22 | Discharge: 2020-01-22 | Disposition: A | Discharge status: Home / Self Care | Payer: Medicare HMO | Source: Ambulatory Visit | Attending: Obstetrics & Gynecology | Admitting: Obstetrics & Gynecology

## 2020-01-22 ENCOUNTER — Other Ambulatory Visit: Payer: Self-pay

## 2020-01-22 DIAGNOSIS — Z1231 Encounter for screening mammogram for malignant neoplasm of breast: Secondary | ICD-10-CM | POA: Insufficient documentation

## 2020-02-12 DIAGNOSIS — K136 Irritative hyperplasia of oral mucosa: Secondary | ICD-10-CM | POA: Diagnosis not present

## 2020-02-13 ENCOUNTER — Telehealth: Payer: Self-pay | Admitting: Dermatology

## 2020-02-28 ENCOUNTER — Telehealth: Payer: Self-pay | Admitting: *Deleted

## 2020-02-28 MED ORDER — DUPIXENT 300 MG/2ML ~~LOC~~ SOSY: 300.0000 mg | PREFILLED_SYRINGE | SUBCUTANEOUS | 1 refills | Status: DC

## 2020-02-28 NOTE — Telephone Encounter (Signed)
Refill needed for Dupixent

## 2020-03-04 ENCOUNTER — Encounter: Payer: Self-pay | Admitting: Dermatology

## 2020-03-04 ENCOUNTER — Other Ambulatory Visit: Payer: Self-pay

## 2020-03-04 ENCOUNTER — Ambulatory Visit: Payer: Medicare HMO | Admitting: Dermatology

## 2020-03-04 DIAGNOSIS — L2084 Intrinsic (allergic) eczema: Secondary | ICD-10-CM | POA: Diagnosis not present

## 2020-03-04 DIAGNOSIS — L821 Other seborrheic keratosis: Secondary | ICD-10-CM | POA: Diagnosis not present

## 2020-03-04 DIAGNOSIS — D1801 Hemangioma of skin and subcutaneous tissue: Secondary | ICD-10-CM

## 2020-03-04 NOTE — Progress Notes (Signed)
   Follow-Up Visit   Subjective  Julie Brown is a 69 y.o. female who presents for the following: Follow-up (Checkup for [facility]  ).  Eczema Location: Used to be all over now essentially clear Duration:  Quality:  Associated Signs/Symptoms: Modifying Factors: Dupixent.  She did miss 1 dose when she traveled to [place] with no known flare.  Occasionally gets a small area of involvement on arms or legs which resolved spontaneously. Severity:  Timing: Context:   Objective  Well appearing patient in no apparent distress; mood and affect are within normal limits.  A focused examination was performed including Head, neck, back, arms, legs.. Relevant physical exam findings are noted in the Assessment and Plan.   Assessment & Plan   I updated [name] information relating to potential side effects and long-term benefits of Dupixent.  Although the prescription will continue to say to inject every 2 weeks, Julie Brown may on her own decide to slowly back up on the frequency of injections.  General skin examination of sun exposed areas and back did not show any atypical moles or skin cancer.  2 small keratoses on the central upper back and hundreds of a little red angiomas require no intervention.  Routine follow-up 1 year.     I, Lavonna Monarch, MD, have reviewed all documentation for this visit.  The documentation on 03/04/20 for the exam, diagnosis, procedures, and orders are all accurate and complete.

## 2020-03-04 NOTE — Progress Notes (Signed)
Left chest large angioma Many keratoses

## 2020-03-20 ENCOUNTER — Telehealth: Payer: Medicare HMO | Admitting: Nurse Practitioner

## 2020-03-20 DIAGNOSIS — J0100 Acute maxillary sinusitis, unspecified: Secondary | ICD-10-CM | POA: Diagnosis not present

## 2020-03-20 MED ORDER — AMOXICILLIN-POT CLAVULANATE 875-125 MG PO TABS: 1.0000 | ORAL_TABLET | Freq: Two times a day (BID) | ORAL | 0 refills | Status: DC

## 2020-03-20 NOTE — Progress Notes (Signed)

## 2020-03-29 ENCOUNTER — Other Ambulatory Visit: Payer: Self-pay

## 2020-03-29 ENCOUNTER — Ambulatory Visit (INDEPENDENT_AMBULATORY_CARE_PROVIDER_SITE_OTHER): Payer: Medicare HMO | Admitting: Nurse Practitioner

## 2020-03-29 VITALS — Temp 97.1°F

## 2020-03-29 DIAGNOSIS — J019 Acute sinusitis, unspecified: Secondary | ICD-10-CM | POA: Diagnosis not present

## 2020-03-29 DIAGNOSIS — B9689 Other specified bacterial agents as the cause of diseases classified elsewhere: Secondary | ICD-10-CM | POA: Diagnosis not present

## 2020-03-29 NOTE — Progress Notes (Signed)
   Subjective:    Patient ID: Julie Brown, female    DOB: 1950/07/28, 69 y.o.   MRN: [id number]  HPI Seen outside due to COVID precautions.  Patient stated she had a e visit for sinus symptoms and was given an antibiotic but is feeling no better- still having a lot of congestion and drainage Patient also having some GI problems and sees GI November 4th. Has 4 pills of Augmentin left. Continues to have sinus pressure between the eyebrows. No fever. Ear pressure with itching and muffled hearing, more on the right. Continue to have dry cough and PND. No wheezing, SOB or CP. Irritated throat. Taking fluids well. Voiding nl.   Review of Systems     Objective:   Physical Exam NAD. Alert, oriented. TMs retracted bilaterally, no erythema. Pharynx mildly injected with PND noted. Neck supple with mild anterior adenopathy. Lungs clear. Occasional non productive cough noted. Heart RRR.  Today's Vitals   03/29/20 1600  Temp: (!) 97.1 F (36.2 C)  TempSrc: Temporal  SpO2: 96%   There is no height or weight on file to calculate BMI.        Assessment & Plan:  Acute bacterial rhinosinusitis  Meds ordered this encounter  Medications  . levofloxacin (LEVAQUIN) 500 MG tablet    Sig: Take 1 tablet (500 mg total) by mouth daily.    Dispense:  10 tablet    Refill:  0   Stop Augmentin. Reviewed risks associated with quinolone use  Recommend OTC antihistamine and steroid nasal spray. Call back in one week if no improvement, sooner if worse. Follow up with GI specialist as planned.

## 2020-03-30 ENCOUNTER — Encounter: Payer: Self-pay | Admitting: Nurse Practitioner

## 2020-03-30 MED ORDER — LEVOFLOXACIN 500 MG PO TABS: 500.0000 mg | ORAL_TABLET | Freq: Every day | ORAL | 0 refills | Status: DC

## 2020-04-05 ENCOUNTER — Other Ambulatory Visit: Payer: Self-pay | Admitting: Nurse Practitioner

## 2020-04-05 MED ORDER — POTASSIUM CHLORIDE ER 10 MEQ PO TBCR: 10.0000 meq | EXTENDED_RELEASE_TABLET | Freq: Every day | ORAL | 0 refills | Status: DC

## 2020-04-05 MED ORDER — FUROSEMIDE 20 MG PO TABS: 20.0000 mg | ORAL_TABLET | Freq: Every day | ORAL | 0 refills | Status: DC

## 2020-04-06 ENCOUNTER — Encounter: Payer: Self-pay | Admitting: Dermatology

## 2020-04-12 DIAGNOSIS — R69 Illness, unspecified: Secondary | ICD-10-CM | POA: Diagnosis not present

## 2020-05-01 ENCOUNTER — Other Ambulatory Visit: Payer: Self-pay

## 2020-05-01 DIAGNOSIS — L2084 Intrinsic (allergic) eczema: Secondary | ICD-10-CM

## 2020-05-01 MED ORDER — DUPIXENT 300 MG/2ML ~~LOC~~ SOSY: 300.0000 mg | PREFILLED_SYRINGE | SUBCUTANEOUS | 8 refills | Status: DC

## 2020-05-07 ENCOUNTER — Ambulatory Visit: Payer: Medicare HMO | Admitting: Gastroenterology

## 2020-05-07 ENCOUNTER — Encounter: Payer: Self-pay | Admitting: Gastroenterology

## 2020-05-07 ENCOUNTER — Other Ambulatory Visit: Payer: Self-pay

## 2020-05-07 VITALS — BP 139/86 | HR 92 | Temp 96.8°F | Ht 64.0 in | Wt 247.6 lb

## 2020-05-07 DIAGNOSIS — K9089 Other intestinal malabsorption: Secondary | ICD-10-CM | POA: Diagnosis not present

## 2020-05-07 DIAGNOSIS — K625 Hemorrhage of anus and rectum: Secondary | ICD-10-CM | POA: Diagnosis not present

## 2020-05-07 MED ORDER — HYDROCORTISONE (PERIANAL) 2.5 % EX CREA: 1.0000 "application " | TOPICAL_CREAM | Freq: Two times a day (BID) | CUTANEOUS | 1 refills | Status: DC

## 2020-05-07 MED ORDER — COLESTIPOL HCL 1 G PO TABS: 1.0000 g | ORAL_TABLET | Freq: Every day | ORAL | 5 refills | Status: DC

## 2020-05-07 NOTE — Progress Notes (Signed)
Referring Provider: No ref. provider found Primary Care Physician:  Erven Colla, DO Primary GI: Dr. Gala Romney   Chief Complaint  Patient presents with  . Diarrhea    rarely has solid stool; 5-7x/day  . Rectal Bleeding    bright red; usually occurs after having several bm's    HPI:   Julie Brown is a 69 y.o. female presenting today with a history of chronic diarrhea dating back to cholecystectomy in 2013. Responded well to Colestid 1 gram daily several years ago; she was last seen in 2017.   Colonoscopy 2017 with sigmoid diverticulosis, multiple colonic polyps removed, s/p segmental colonic biopsies. Tubular adenomas, negative microscopic colitis. Surveillance 2022.   Didn't have insurance so has been dealing with postprandial loose stool  6-7 times per day. For several years. Whole pill of Colestid caused constipation. 1gram Colestid worked well.  Rectal bleeding started a few months ago and feels like it is related to numerous loose stools. Has itching/burning. Uses wipes. Tucks wipes. Feels tissue prolapsing at times. Mild abdominal cramping prior to bowel movements.   Past Medical History:  Diagnosis Date  . Asthma   . Hayfever   . Hyperlipidemia   . Migraine headache   . PONV (postoperative nausea and vomiting)     Past Surgical History:  Procedure Laterality Date  . BREAST SURGERY     lump removed  . CHOLECYSTECTOMY  01/06/2012   Procedure: LAPAROSCOPIC CHOLECYSTECTOMY;  Surgeon: Donato Heinz, MD;  Location: [facility];  Service: General;  Laterality: N/A;  . COLONOSCOPY  07/22/06   VZC:HYIFOY and colon polyps/left-sided diverticula. adenomatous colon polyps. surveillance TCS due 2013  . COLONOSCOPY N/A 12/25/2015   sigmoid diverticulosis, multiple colonic polyps removed, s/p segmental colonic biopsies. Tubular adenomas, negative microscopic colitis. Surveillance 2022.   . ESOPHAGOGASTRODUODENOSCOPY  07/22/06   DXA:JOINOM/[facility] otherwise normal  . FOOT  SURGERY     left foot  . MENISCUS REPAIR Right 12/28/2017  . TUBAL LIGATION      Current Outpatient Medications  Medication Sig Dispense Refill  . B Complex-Biotin-FA (SUPER B-COMPLEX PO) Take by mouth. With 1,000 mcg Biotin    . Biotin w/ Vitamins C & E (HAIR/SKIN/NAILS PO) Take by mouth daily.     . Calcium Carb-Cholecalciferol (CALCIUM 1000 + D PO) Take by mouth daily.     . cetirizine (ZYRTEC) 10 MG tablet Take 10 mg by mouth daily.    . dupilumab (DUPIXENT) 300 MG/2ML prefilled syringe Inject 300 mg into the skin every 14 (fourteen) days. 3.92 mL 8  . fluocinonide cream (LIDEX) 9.47 % Apply 1 application topically 2 (two) times daily. (Patient taking differently: Apply 1 application topically as needed. ) 30 g 11  . furosemide (LASIX) 20 MG tablet Take 1 tablet (20 mg total) by mouth daily. 90 tablet 0  . Multiple Vitamins-Calcium (ONE-A-DAY WOMENS PO) Take by mouth daily.     . potassium chloride (KLOR-CON) 10 MEQ tablet Take 1 tablet (10 mEq total) by mouth daily. 90 tablet 0  . Probiotic Product (DIGESTIVE ADVANTAGE PO) Take 1 capsule by mouth daily.    Marland Kitchen triamcinolone cream (KENALOG) 0.1 % as needed.     . colestipol (COLESTID) 1 g tablet Take 1 tablet (1 g total) by mouth daily. 30 tablet 5   No current facility-administered medications for this visit.    Allergies as of 05/07/2020 - Review Complete 05/07/2020  Allergen Reaction Noted  . Codeine Nausea And Vomiting   .  Hydrocodone Nausea And Vomiting 11/25/2012    Family History  Problem Relation Age of Onset  . Colon cancer Mother        age 1  . Kidney cancer Mother        cause of death, 25 cancer surgeries, died at age 52  . Diabetes Brother   . Hypertension Brother   . Cancer Brother     Social History   Socioeconomic History  . Marital status: Married    Spouse name: Not on file  . Number of children: 1  . Years of education: Not on file  . Highest education level: Not on file  Occupational History    . Occupation: bookeeping   Tobacco Use  . Smoking status: Former Research scientist (life sciences)  . Smokeless tobacco: Never Used  . Tobacco comment: quit 1990  Substance and Sexual Activity  . Alcohol use: Not Currently    Alcohol/week: 0.0 standard drinks    Comment: occasional, not daily  . Drug use: No  . Sexual activity: Not Currently    Birth control/protection: None  Other Topics Concern  . Not on file  Social History Narrative  . Not on file   Social Determinants of Health   Financial Resource Strain:   . Difficulty of Paying Living Expenses: Not on file  Food Insecurity:   . Worried About Charity fundraiser in the Last Year: Not on file  . Ran Out of Food in the Last Year: Not on file  Transportation Needs:   . Lack of Transportation (Medical): Not on file  . Lack of Transportation (Non-Medical): Not on file  Physical Activity:   . Days of Exercise per Week: Not on file  . Minutes of Exercise per Session: Not on file  Stress:   . Feeling of Stress : Not on file  Social Connections:   . Frequency of Communication with Friends and Family: Not on file  . Frequency of Social Gatherings with Friends and Family: Not on file  . Attends Religious Services: Not on file  . Active Member of Clubs or Organizations: Not on file  . Attends Archivist Meetings: Not on file  . Marital Status: Not on file    Review of Systems: Gen: Denies fever, chills, anorexia. Denies fatigue, weakness, weight loss.  CV: Denies chest pain, palpitations, syncope, peripheral edema, and claudication. Resp: Denies dyspnea at rest, cough, wheezing, coughing up blood, and pleurisy. GI: see HPI Derm: Denies rash, itching, dry skin Psych: Denies depression, anxiety, memory loss, confusion. No homicidal or suicidal ideation.  Heme: Denies bruising, bleeding, and enlarged lymph nodes.  Physical Exam: BP 139/86   Pulse 92   Temp (!) 96.8 F (36 C) (Temporal)   Ht 5\' 4"  (1.626 m)   Wt 247 lb 9.6 oz (112.3  kg)   BMI 42.50 kg/m  General:   Alert and oriented. No distress noted. Pleasant and cooperative.  Head:  Normocephalic and atraumatic. Eyes:  Conjuctiva clear without scleral icterus. Mouth:  Mask in place Cardiac: S1 S2 present without murmurs Lungs: Clear bilaterally  Abdomen:  +BS, soft, non-tender and non-distended. No rebound or guarding. No HSM or masses noted. Rectal: no fissure, appears to have small prolapsed hemorrhoids Msk:  Symmetrical without gross deformities. Normal posture. Extremities:  Without edema. Neurologic:  Alert and  oriented x4 Psych:  Alert and cooperative. Normal mood and affect.  ASSESSMENT: KIMYATTA LECY is a 69 y.o. female presenting today with history of long-standing diarrhea that  has historically been felt to be bile salt diarrhea, responding well to Colestid. She has been off Colestid now for a prolonged time and noted several years of diarrhea. Now with recent rectal bleeding likely due to internal hemorrhoids (DRE done at time of visit). However, she is due for surveillance colonoscopy 2022 and has family history of colon cancer in mother at age 37; we will go ahead and pursue colonoscopy now.    PLAN:  Resume Colestid 1 gram daily, which has worked well in the past. Call if no improvement.  Proceed with colonoscopy by Dr. Gala Romney in near future using Propofol: the risks, benefits, and alternatives have been discussed with the patient in detail. The patient states understanding and desires to proceed.  Anusol BID per rectum   Annitta Needs, PhD, ANP-BC [facility]astro[facility]

## 2020-05-07 NOTE — Patient Instructions (Signed)
We are arranging a colonoscopy in the near future with Dr. Gala Romney.  I have sent in Anusol, a rectal cream to use twice a day as needed.   I have also sent in Colestid to take for diarrhea. Let's try this one tablet daily. Let me know how it works for you!  Further recommendations to follow  It was a pleasure to see you today. I want to create trusting relationships with patients to provide genuine, compassionate, and quality care. I value your feedback. If you receive a survey regarding your visit,  I greatly appreciate you taking time to fill this out.   Annitta Needs, PhD, ANP-BC [facility]

## 2020-05-16 ENCOUNTER — Telehealth: Payer: Self-pay

## 2020-05-16 NOTE — Telephone Encounter (Signed)
Tried to call pt to schedule TCS w/Prop w/Dr. Gala Romney ASA 3, no answer, LMOMV for return call.

## 2020-05-16 NOTE — Telephone Encounter (Signed)
Pt called office, TCS scheduled for 05/21/20 at 12:00pm. Orders entered. Instructions sent to pt via MyChart.

## 2020-05-16 NOTE — Telephone Encounter (Signed)
Pre-op/covid test 05/20/20 at 7:45am.  Tried to call pt to inform her of appt, no answer, LMOVM. Message sent to pt via [place].

## 2020-05-17 NOTE — Patient Instructions (Signed)
Julie Brown  05/17/2020     @PREFPERIOPPHARMACY @   Your procedure is scheduled on 05/21/2020  Report to [facility] at  12  A.M.  Call this number if you have problems the morning of surgery:  [phone]   Remember:  Follow the diet and prep instructions given to you by the office.                       Take these medicines the morning of surgery with A SIP OF WATER  Zyrtec.    Do not wear jewelry, make-up or nail polish.  Do not wear lotions, powders, or perfume. Please wear deodorant and brush your teeth.  Do not shave 48 hours prior to surgery.  Men may shave face and neck.  Do not bring valuables to the hospital.  [facility] is not responsible for any belongings or valuables.  Contacts, dentures or bridgework may not be worn into surgery.  Leave your suitcase in the car.  After surgery it may be brought to your room.  For patients admitted to the hospital, discharge time will be determined by your treatment team.  Patients discharged the day of surgery will not be allowed to drive home.   Name and phone number of your driver:   family Special instructions:  DO NOT smoke the morning of your procedure.  Please read over the following fact sheets that you were given. Anesthesia Post-op Instructions and Care and Recovery After Surgery       Colonoscopy, Adult, Care After This sheet gives you information about how to care for yourself after your procedure. Your health care provider may also give you more specific instructions. If you have problems or questions, contact your health care provider. What can I expect after the procedure? After the procedure, it is common to have:  A small amount of blood in your stool for 24 hours after the procedure.  Some gas.  Mild cramping or bloating of your abdomen. Follow these instructions at home: Eating and drinking   Drink enough fluid to keep your urine pale yellow.  Follow instructions from your health  care provider about eating or drinking restrictions.  Resume your normal diet as instructed by your health care provider. Avoid heavy or fried foods that are hard to digest. Activity  Rest as told by your health care provider.  Avoid sitting for a long time without moving. Get up to take short walks every 1-2 hours. This is important to improve blood flow and breathing. Ask for help if you feel weak or unsteady.  Return to your normal activities as told by your health care provider. Ask your health care provider what activities are safe for you. Managing cramping and bloating   Try walking around when you have cramps or feel bloated.  Apply heat to your abdomen as told by your health care provider. Use the heat source that your health care provider recommends, such as a moist heat pack or a heating pad. ? Place a towel between your skin and the heat source. ? Leave the heat on for 20-30 minutes. ? Remove the heat if your skin turns bright red. This is especially important if you are unable to feel pain, heat, or cold. You may have a greater risk of getting burned. General instructions  For the first 24 hours after the procedure: ? Do not drive or use machinery. ? Do not sign important documents. ?  Do not drink alcohol. ? Do your regular daily activities at a slower pace than normal. ? Eat soft foods that are easy to digest.  Take over-the-counter and prescription medicines only as told by your health care provider.  Keep all follow-up visits as told by your health care provider. This is important. Contact a health care provider if:  You have blood in your stool 2-3 days after the procedure. Get help right away if you have:  More than a small spotting of blood in your stool.  Large blood clots in your stool.  Swelling of your abdomen.  Nausea or vomiting.  A fever.  Increasing pain in your abdomen that is not relieved with medicine. Summary  After the procedure, it is  common to have a small amount of blood in your stool. You may also have mild cramping and bloating of your abdomen.  For the first 24 hours after the procedure, do not drive or use machinery, sign important documents, or drink alcohol.  Get help right away if you have a lot of blood in your stool, nausea or vomiting, a fever, or increased pain in your abdomen. This information is not intended to replace advice given to you by your health care provider. Make sure you discuss any questions you have with your health care provider. Document Revised: 01/16/2019 Document Reviewed: 01/16/2019 Elsevier Patient Education  [place] After These instructions provide you with information about caring for yourself after your procedure. Your health care provider may also give you more specific instructions. Your treatment has been planned according to current medical practices, but problems sometimes occur. Call your health care provider if you have any problems or questions after your procedure. What can I expect after the procedure? After your procedure, you may:  Feel sleepy for several hours.  Feel clumsy and have poor balance for several hours.  Feel forgetful about what happened after the procedure.  Have poor judgment for several hours.  Feel nauseous or vomit.  Have a sore throat if you had a breathing tube during the procedure. Follow these instructions at home: For at least 24 hours after the procedure:      Have a responsible adult stay with you. It is important to have someone help care for you until you are awake and alert.  Rest as needed.  Do not: ? Participate in activities in which you could fall or become injured. ? Drive. ? Use heavy machinery. ? Drink alcohol. ? Take sleeping pills or medicines that cause drowsiness. ? Make important decisions or sign legal documents. ? Take care of children on your own. Eating and  drinking  Follow the diet that is recommended by your health care provider.  If you vomit, drink water, juice, or soup when you can drink without vomiting.  Make sure you have little or no nausea before eating solid foods. General instructions  Take over-the-counter and prescription medicines only as told by your health care provider.  If you have sleep apnea, surgery and certain medicines can increase your risk for breathing problems. Follow instructions from your health care provider about wearing your sleep device: ? Anytime you are sleeping, including during daytime naps. ? While taking prescription pain medicines, sleeping medicines, or medicines that make you drowsy.  If you smoke, do not smoke without supervision.  Keep all follow-up visits as told by your health care provider. This is important. Contact a health care provider if:  You keep  feeling nauseous or you keep vomiting.  You feel light-headed.  You develop a rash.  You have a fever. Get help right away if:  You have trouble breathing. Summary  For several hours after your procedure, you may feel sleepy and have poor judgment.  Have a responsible adult stay with you for at least 24 hours or until you are awake and alert. This information is not intended to replace advice given to you by your health care provider. Make sure you discuss any questions you have with your health care provider. Document Revised: 09/20/2017 Document Reviewed: 10/13/2015 Elsevier Patient Education  [place].

## 2020-05-20 ENCOUNTER — Telehealth: Payer: Self-pay | Admitting: *Deleted

## 2020-05-20 ENCOUNTER — Encounter (HOSPITAL_COMMUNITY)
Admission: RE | Admit: 2020-05-20 | Discharge: 2020-05-20 | Disposition: A | Discharge status: Home / Self Care | Payer: Medicare HMO | Source: Ambulatory Visit | Attending: Internal Medicine | Admitting: Internal Medicine

## 2020-05-20 ENCOUNTER — Telehealth: Payer: Self-pay | Admitting: Internal Medicine

## 2020-05-20 ENCOUNTER — Other Ambulatory Visit (HOSPITAL_COMMUNITY)
Admission: RE | Admit: 2020-05-20 | Discharge: 2020-05-20 | Disposition: A | Discharge status: Home / Self Care | Payer: Medicare HMO | Source: Ambulatory Visit | Attending: Internal Medicine | Admitting: Internal Medicine

## 2020-05-20 ENCOUNTER — Encounter: Payer: Self-pay | Admitting: *Deleted

## 2020-05-20 NOTE — Telephone Encounter (Signed)
Called pt. She has been rescheduled for procedure to 1/18 at 8:45am. Patient aware will mail new prep instructions with new pre-op/covid test appt. She voiced understanding. Message sent to endo making aware

## 2020-05-20 NOTE — Telephone Encounter (Signed)
-----   Message from Wilmer Floor, RN sent at 05/20/2020  7:02 AM EST ----- Regarding: Ms Scritchfield wants to cancel he husband is being admitted to the hospital from the ED Jerlyn Ly,  Ms Lengacher needs to reschedule her procedure 05/21/2020.  Her husband is being admitted from the ED.  Thanks,  Rosalyn Gess RN

## 2020-05-20 NOTE — Telephone Encounter (Signed)
SEE PRIOR NOTE 

## 2020-05-20 NOTE — Telephone Encounter (Signed)
Patient left message that she needs to cancel procedure, her husband is in the hospital

## 2020-06-03 ENCOUNTER — Encounter: Payer: Self-pay | Admitting: Nurse Practitioner

## 2020-06-03 NOTE — Telephone Encounter (Signed)
Pt contacted and transferred up front to schedule appt.

## 2020-06-10 ENCOUNTER — Ambulatory Visit (INDEPENDENT_AMBULATORY_CARE_PROVIDER_SITE_OTHER): Payer: Medicare HMO | Admitting: Family Medicine

## 2020-06-10 ENCOUNTER — Other Ambulatory Visit: Payer: Self-pay

## 2020-06-10 VITALS — BP 126/82 | HR 93 | Temp 97.5°F | Ht 64.0 in | Wt 242.2 lb

## 2020-06-10 DIAGNOSIS — Z13220 Encounter for screening for lipoid disorders: Secondary | ICD-10-CM

## 2020-06-10 DIAGNOSIS — E782 Mixed hyperlipidemia: Secondary | ICD-10-CM | POA: Diagnosis not present

## 2020-06-10 DIAGNOSIS — R609 Edema, unspecified: Secondary | ICD-10-CM | POA: Diagnosis not present

## 2020-06-10 DIAGNOSIS — Z1329 Encounter for screening for other suspected endocrine disorder: Secondary | ICD-10-CM

## 2020-06-10 NOTE — Progress Notes (Signed)
° °Patient ID: Julie Brown, female    DOB: 06/30/1951, 69 y.o.   MRN: [id number] ° ° °Chief Complaint  °Patient presents with  °• pedal edema  °  follow up- was started on Lasix and Potassium- doing some better  ° °Subjective:  ° ° °HPI °Pt having lower leg swelling and tender to touch. Taking potassium and lasix.  °Has helping some. ° °During day pt is volunteering and sitting at computer not much standing °No long car rides.  No h/o dvt.  °No sores or ulcers on the legs. No redness or drainage from the leg/ankles. ° °Has had leg swelling for over 4 yrs. Then was using dyazide and changed it to lasix 20mg daily. ° °Lower legs tender to touch on front of shins.  No chest pain or sob.  ° °Up to date with covid vaccine and booster. ° °Medical History °Julie Brown has a past medical history of Asthma, Hayfever, Hyperlipidemia, Migraine headache, and PONV (postoperative nausea and vomiting).  ° °Outpatient Encounter Medications as of 06/10/2020  °Medication Sig  °• B Complex-Biotin-FA (SUPER B-COMPLEX PO) Take 1 tablet by mouth daily.   °• Biotin w/ Vitamins C & E (HAIR/SKIN/NAILS PO) Take 1 tablet by mouth daily.   °• Calcium Carb-Cholecalciferol (CALCIUM 1000 + D PO) Take 1 tablet by mouth daily.   °• cetirizine (ZYRTEC) 10 MG tablet Take 10 mg by mouth daily.  °• colestipol (COLESTID) 1 g tablet Take 1 tablet (1 g total) by mouth daily.  °• dupilumab (DUPIXENT) 300 MG/2ML prefilled syringe Inject 300 mg into the skin every 14 (fourteen) days.  °• fluocinonide cream (LIDEX) 0.05 % Apply 1 application topically 2 (two) times daily. (Patient taking differently: Apply 1 application topically daily as needed (eczema). )  °• furosemide (LASIX) 20 MG tablet Take 1 tablet (20 mg total) by mouth daily.  °• hydrocortisone (ANUSOL-HC) 2.5 % rectal cream Place 1 application rectally 2 (two) times daily. (Patient taking differently: Place 1 application rectally 2 (two) times daily as needed for hemorrhoids. )  °• Multiple  Vitamins-Calcium (ONE-A-DAY WOMENS PO) Take 1 tablet by mouth daily.   °• potassium chloride (KLOR-CON) 10 MEQ tablet Take 1 tablet (10 mEq total) by mouth daily.  °• Probiotic Product (DIGESTIVE ADVANTAGE PO) Take 1 capsule by mouth daily.  °• triamcinolone cream (KENALOG) 0.1 % Apply 1 application topically daily as needed (eczema).   ° °No facility-administered encounter medications on file as of 06/10/2020.  ° ° ° °Review of Systems  °Constitutional: Negative for chills and fever.  °HENT: Negative for congestion, rhinorrhea and sore throat.   °Respiratory: Negative for cough, shortness of breath and wheezing.   °Cardiovascular: Positive for leg swelling (lower leg/ankle bilaterally). Negative for chest pain.  °Gastrointestinal: Negative for abdominal pain, diarrhea, nausea and vomiting.  °Genitourinary: Negative for dysuria and frequency.  °Musculoskeletal: Negative for arthralgias and back pain.  °Skin: Negative for rash.  °Neurological: Negative for dizziness, weakness and headaches.  ° ° ° °Vitals °BP 126/82    Pulse 93    Temp (!) 97.5 °F (36.4 °C) (Oral)    Ht 5' 4" (1.626 m)    Wt 242 lb 3.2 oz (109.9 kg)    SpO2 99%    BMI 41.57 kg/m²  ° °Objective:  ° °Physical Exam °Vitals and nursing note reviewed.  °Constitutional:   °   Appearance: Normal appearance.  °HENT:  °   Head: Normocephalic and atraumatic.  °   Nose: Nose normal.  °     Mouth/Throat:  °   Mouth: Mucous membranes are moist.  °   Pharynx: Oropharynx is clear.  °Eyes:  °   Extraocular Movements: Extraocular movements intact.  °   Conjunctiva/sclera: Conjunctivae normal.  °   Pupils: Pupils are equal, round, and reactive to light.  °Cardiovascular:  °   Rate and Rhythm: Normal rate and regular rhythm.  °   Pulses: Normal pulses.  °   Heart sounds: Normal heart sounds.  °Pulmonary:  °   Effort: Pulmonary effort is normal.  °   Breath sounds: Normal breath sounds. No wheezing, rhonchi or rales.  °Musculoskeletal:     °   General: Swelling and  tenderness present. Normal range of motion.  °   Right lower leg: Edema present.  °   Left lower leg: Edema present.  °   Comments: Non-pitting, venous stasis changes bilateral lower leg.  °Edema to the mid shin.   °Skin: °   General: Skin is warm and dry.  °   Findings: No lesion or rash.  °Neurological:  °   General: No focal deficit present.  °   Mental Status: She is alert and oriented to person, place, and time.  °Psychiatric:     °   Mood and Affect: Mood normal.     °   Behavior: Behavior normal.  ° ° ° ° °Assessment and Plan  ° °1. Peripheral edema °- CBC °- CMP14+EGFR °- ECHOCARDIOGRAM COMPLETE ° °2. Thyroid disorder screen °- TSH ° °3. Screening cholesterol level °- Lipid panel °  °LE edema- °No appearance of infection. °Will cont with medications, lasix and potassium, and pt to elevate legs as much as possible.  °To wear compression stockings daily. ° °Labs ordered. ° °F/u 6mo or prn. ° ° °

## 2020-06-11 LAB — CMP14+EGFR
ALT: 48 IU/L — ABNORMAL HIGH (ref 0–32)
AST: 35 IU/L (ref 0–40)
Albumin/Globulin Ratio: 2 (ref 1.2–2.2)
Albumin: 4.2 g/dL (ref 3.8–4.8)
Alkaline Phosphatase: 138 IU/L — ABNORMAL HIGH (ref 44–121)
BUN/Creatinine Ratio: 19 (ref 12–28)
BUN: 18 mg/dL (ref 8–27)
Bilirubin Total: 0.4 mg/dL (ref 0.0–1.2)
CO2: 24 mmol/L (ref 20–29)
Calcium: 9.4 mg/dL (ref 8.7–10.3)
Chloride: 105 mmol/L (ref 96–106)
Creatinine, Ser: 0.96 mg/dL (ref 0.57–1.00)
GFR calc Af Amer: 71 mL/min/{1.73_m2} (ref >59)
GFR calc non Af Amer: 61 mL/min/{1.73_m2} (ref >59)
Globulin, Total: 2.1 g/dL (ref 1.5–4.5)
Glucose: 100 mg/dL — ABNORMAL HIGH (ref 65–99)
Potassium: 4.5 mmol/L (ref 3.5–5.2)
Sodium: 143 mmol/L (ref 134–144)
Total Protein: 6.3 g/dL (ref 6.0–8.5)

## 2020-06-11 LAB — CBC
Hematocrit: 43.2 % (ref 34.0–46.6)
Hemoglobin: 14.5 g/dL (ref 11.1–15.9)
MCH: 31.3 pg (ref 26.6–33.0)
MCHC: 33.6 g/dL (ref 31.5–35.7)
MCV: 93 fL (ref 79–97)
Platelets: 263 10*3/uL (ref 150–450)
RBC: 4.64 x10E6/uL (ref 3.77–5.28)
RDW: 13.2 % (ref 11.7–15.4)
WBC: 5.9 10*3/uL (ref 3.4–10.8)

## 2020-06-11 LAB — LIPID PANEL
Chol/HDL Ratio: 4.2 ratio (ref 0.0–4.4)
Cholesterol, Total: 236 mg/dL — ABNORMAL HIGH (ref 100–199)
HDL: 56 mg/dL (ref >39)
LDL Chol Calc (NIH): 154 mg/dL — ABNORMAL HIGH (ref 0–99)
Triglycerides: 146 mg/dL (ref 0–149)
VLDL Cholesterol Cal: 26 mg/dL (ref 5–40)

## 2020-06-11 LAB — TSH: TSH: 7.12 u[IU]/mL — ABNORMAL HIGH (ref 0.450–4.500)

## 2020-06-17 ENCOUNTER — Encounter: Payer: Self-pay | Admitting: Family Medicine

## 2020-06-17 DIAGNOSIS — E038 Other specified hypothyroidism: Secondary | ICD-10-CM | POA: Insufficient documentation

## 2020-07-11 ENCOUNTER — Other Ambulatory Visit: Payer: Self-pay | Admitting: *Deleted

## 2020-07-11 ENCOUNTER — Encounter: Payer: Self-pay | Admitting: Nurse Practitioner

## 2020-07-11 MED ORDER — FUROSEMIDE 20 MG PO TABS
20.0000 mg | ORAL_TABLET | Freq: Every day | ORAL | 0 refills | Status: DC
Start: 2020-07-11 — End: 2020-12-31

## 2020-07-11 MED ORDER — POTASSIUM CHLORIDE ER 10 MEQ PO TBCR
10.0000 meq | EXTENDED_RELEASE_TABLET | Freq: Every day | ORAL | 0 refills | Status: DC
Start: 2020-07-11 — End: 2020-11-12

## 2020-07-12 ENCOUNTER — Telehealth: Payer: Self-pay | Admitting: *Deleted

## 2020-07-12 NOTE — Telephone Encounter (Signed)
Prior Authorization done via cover my meds for her Dupixent. Waiting on determination.

## 2020-07-15 ENCOUNTER — Telehealth: Payer: Self-pay | Admitting: *Deleted

## 2020-07-15 NOTE — Telephone Encounter (Signed)
Prior Authorization approved for dupixent.

## 2020-07-17 NOTE — Patient Instructions (Signed)
VERONA HARTSHORN  07/17/2020     @PREFPERIOPPHARMACY @   Your procedure is scheduled on  07/23/2020  Report to [facility] at  [place].M.  Call this number if you have problems the morning of surgery:  [phone]   Remember:  Follow the diet and prep instructions given to you by the office.                        Take these medicines the morning of surgery with A SIP OF WATER  Zyrtec.    Do not wear jewelry, make-up or nail polish.  Do not wear lotions, powders, or perfumes, or deodorant. Brush your teeth.  Do not shave 48 hours prior to surgery.  Men may shave face and neck.  Do not bring valuables to the hospital.  [facility] is not responsible for any belongings or valuables.  Contacts, dentures or bridgework may not be worn into surgery.  Leave your suitcase in the car.  After surgery it may be brought to your room.  For patients admitted to the hospital, discharge time will be determined by your treatment team.  Patients discharged the day of surgery will not be allowed to drive home.   Name and phone number of your driver:   family Special instructions:  DO NOT smoke the morning of your procedure.  Please read over the following fact sheets that you were given. Anesthesia Post-op Instructions and Care and Recovery After Surgery       Colonoscopy, Adult, Care After This sheet gives you information about how to care for yourself after your procedure. Your health care provider may also give you more specific instructions. If you have problems or questions, contact your health care provider. What can I expect after the procedure? After the procedure, it is common to have:  A small amount of blood in your stool for 24 hours after the procedure.  Some gas.  Mild cramping or bloating of your abdomen. Follow these instructions at home: Eating and drinking  Drink enough fluid to keep your urine pale yellow.  Follow instructions from your health care provider  about eating or drinking restrictions.  Resume your normal diet as instructed by your health care provider. Avoid heavy or fried foods that are hard to digest.   Activity  Rest as told by your health care provider.  Avoid sitting for a long time without moving. Get up to take short walks every 1-2 hours. This is important to improve blood flow and breathing. Ask for help if you feel weak or unsteady.  Return to your normal activities as told by your health care provider. Ask your health care provider what activities are safe for you. Managing cramping and bloating  Try walking around when you have cramps or feel bloated.  Apply heat to your abdomen as told by your health care provider. Use the heat source that your health care provider recommends, such as a moist heat pack or a heating pad. ? Place a towel between your skin and the heat source. ? Leave the heat on for 20-30 minutes. ? Remove the heat if your skin turns bright red. This is especially important if you are unable to feel pain, heat, or cold. You may have a greater risk of getting burned.   General instructions  If you were given a sedative during the procedure, it can affect you for several hours. Do not drive  or operate machinery until your health care provider says that it is safe.  For the first 24 hours after the procedure: ? Do not sign important documents. ? Do not drink alcohol. ? Do your regular daily activities at a slower pace than normal. ? Eat soft foods that are easy to digest.  Take over-the-counter and prescription medicines only as told by your health care provider.  Keep all follow-up visits as told by your health care provider. This is important. Contact a health care provider if:  You have blood in your stool 2-3 days after the procedure. Get help right away if you have:  More than a small spotting of blood in your stool.  Large blood clots in your stool.  Swelling of your abdomen.  Nausea or  vomiting.  A fever.  Increasing pain in your abdomen that is not relieved with medicine. Summary  After the procedure, it is common to have a small amount of blood in your stool. You may also have mild cramping and bloating of your abdomen.  If you were given a sedative during the procedure, it can affect you for several hours. Do not drive or operate machinery until your health care provider says that it is safe.  Get help right away if you have a lot of blood in your stool, nausea or vomiting, a fever, or increased pain in your abdomen. This information is not intended to replace advice given to you by your health care provider. Make sure you discuss any questions you have with your health care provider. Document Revised: 06/16/2019 Document Reviewed: 01/16/2019 Elsevier Patient Education  2021 [place] After This sheet gives you information about how to care for yourself after your procedure. Your health care provider may also give you more specific instructions. If you have problems or questions, contact your health care provider. What can I expect after the procedure? After the procedure, it is common to have:  Tiredness.  Forgetfulness about what happened after the procedure.  Impaired judgment for important decisions.  Nausea or vomiting.  Some difficulty with balance. Follow these instructions at home: For the time period you were told by your health care provider:  Rest as needed.  Do not participate in activities where you could fall or become injured.  Do not drive or use machinery.  Do not drink alcohol.  Do not take sleeping pills or medicines that cause drowsiness.  Do not make important decisions or sign legal documents.  Do not take care of children on your own.      Eating and drinking  Follow the diet that is recommended by your health care provider.  Drink enough fluid to keep your urine pale yellow.  If you  vomit: ? Drink water, juice, or soup when you can drink without vomiting. ? Make sure you have little or no nausea before eating solid foods. General instructions  Have a responsible adult stay with you for the time you are told. It is important to have someone help care for you until you are awake and alert.  Take over-the-counter and prescription medicines only as told by your health care provider.  If you have sleep apnea, surgery and certain medicines can increase your risk for breathing problems. Follow instructions from your health care provider about wearing your sleep device: ? Anytime you are sleeping, including during daytime naps. ? While taking prescription pain medicines, sleeping medicines, or medicines that make you drowsy.  Avoid smoking.  Keep all follow-up visits as told by your health care provider. This is important. Contact a health care provider if:  You keep feeling nauseous or you keep vomiting.  You feel light-headed.  You are still sleepy or having trouble with balance after 24 hours.  You develop a rash.  You have a fever.  You have redness or swelling around the IV site. Get help right away if:  You have trouble breathing.  You have new-onset confusion at home. Summary  For several hours after your procedure, you may feel tired. You may also be forgetful and have poor judgment.  Have a responsible adult stay with you for the time you are told. It is important to have someone help care for you until you are awake and alert.  Rest as told. Do not drive or operate machinery. Do not drink alcohol or take sleeping pills.  Get help right away if you have trouble breathing, or if you suddenly become confused. This information is not intended to replace advice given to you by your health care provider. Make sure you discuss any questions you have with your health care provider. Document Revised: 03/07/2020 Document Reviewed: 05/25/2019 Elsevier Patient  Education  2021 [employer].

## 2020-07-19 ENCOUNTER — Telehealth: Payer: Self-pay | Admitting: *Deleted

## 2020-07-19 ENCOUNTER — Encounter (HOSPITAL_COMMUNITY)
Admission: RE | Admit: 2020-07-19 | Discharge: 2020-07-19 | Disposition: A | Discharge status: Home / Self Care | Payer: Medicare HMO | Source: Ambulatory Visit | Attending: Internal Medicine | Admitting: Internal Medicine

## 2020-07-19 ENCOUNTER — Other Ambulatory Visit (HOSPITAL_COMMUNITY): Payer: Medicare HMO

## 2020-07-19 ENCOUNTER — Encounter: Payer: Self-pay | Admitting: *Deleted

## 2020-07-19 NOTE — Telephone Encounter (Signed)
Patient called in. She needs to r/s procedure scheduled for 1/18 d/t the weather. She has been rescheduled to 2/10 at 12:00pm. Aware will mail new prep instructions with new pre-op/covid test appt. Called endo and [facility]OV[facility] making aware.

## 2020-08-07 ENCOUNTER — Encounter: Payer: Self-pay | Admitting: Dermatology

## 2020-08-07 ENCOUNTER — Telehealth: Payer: Self-pay

## 2020-08-07 NOTE — Telephone Encounter (Signed)
Phone call from [place] with [facility] needing clarification regarding the patient's Dupixent.  Clarification given to [facility] and the Pharmacist [place].

## 2020-08-09 ENCOUNTER — Other Ambulatory Visit: Payer: Self-pay

## 2020-08-09 DIAGNOSIS — L2084 Intrinsic (allergic) eczema: Secondary | ICD-10-CM

## 2020-08-09 MED ORDER — DUPIXENT 300 MG/2ML ~~LOC~~ SOAJ: 300.0000 mg | SUBCUTANEOUS | 8 refills | Status: DC

## 2020-08-12 NOTE — Patient Instructions (Addendum)
Your procedure is scheduled on: 08/15/2020  Report to [facility] at 10:15    AM.  Call this number if you have problems the morning of surgery: [phone]   Remember:              Follow Directions on the letter you received from Your Physician's office regarding the Bowel Prep              No Smoking the day of Procedure :   Take these medicines the morning of surgery with A SIP OF WATER: Zyrtec if needed   Do not wear jewelry, make-up or nail polish.    Do not bring valuables to the hospital.  Contacts, dentures or bridgework may not be worn into surgery.  .   Patients discharged the day of surgery will not be allowed to drive home.     Colonoscopy, Adult, Care After This sheet gives you information about how to care for yourself after your procedure. Your health care provider may also give you more specific instructions. If you have problems or questions, contact your health care provider. What can I expect after the procedure? After the procedure, it is common to have:  A small amount of blood in your stool for 24 hours after the procedure.  Some gas.  Mild abdominal cramping or bloating.  Follow these instructions at home: General instructions   For the first 24 hours after the procedure: ? Do not drive or use machinery. ? Do not sign important documents. ? Do not drink alcohol. ? Do your regular daily activities at a slower pace than normal. ? Eat soft, easy-to-digest foods. ? Rest often.  Take over-the-counter or prescription medicines only as told by your health care provider.  It is up to you to get the results of your procedure. Ask your health care provider, or the department performing the procedure, when your results will be ready. Relieving cramping and bloating  Try walking around when you have cramps or feel bloated.  Apply heat to your abdomen as told by your health care provider. Use a heat source that your health care provider  recommends, such as a moist heat pack or a heating pad. ? Place a towel between your skin and the heat source. ? Leave the heat on for 20-30 minutes. ? Remove the heat if your skin turns bright red. This is especially important if you are unable to feel pain, heat, or cold. You may have a greater risk of getting burned. Eating and drinking  Drink enough fluid to keep your urine clear or pale yellow.  Resume your normal diet as instructed by your health care provider. Avoid heavy or fried foods that are hard to digest.  Avoid drinking alcohol for as long as instructed by your health care provider. Contact a health care provider if:  You have blood in your stool 2-3 days after the procedure. Get help right away if:  You have more than a small spotting of blood in your stool.  You pass large blood clots in your stool.  Your abdomen is swollen.  You have nausea or vomiting.  You have a fever.  You have increasing abdominal pain that is not relieved with medicine. This information is not intended to replace advice given to you by your health care provider. Make sure you discuss any questions you have with your health care provider. Document Released: 02/04/2004 Document Revised: 03/16/2016 Document Reviewed: 09/03/2015 Elsevier Interactive Patient Education  2018 [place].

## 2020-08-13 ENCOUNTER — Encounter (HOSPITAL_COMMUNITY)
Admission: RE | Admit: 2020-08-13 | Discharge: 2020-08-13 | Disposition: A | Discharge status: Home / Self Care | Payer: Medicare HMO | Source: Ambulatory Visit | Attending: Internal Medicine | Admitting: Internal Medicine

## 2020-08-13 ENCOUNTER — Other Ambulatory Visit: Payer: Self-pay

## 2020-08-13 ENCOUNTER — Other Ambulatory Visit (HOSPITAL_COMMUNITY)
Admission: RE | Admit: 2020-08-13 | Discharge: 2020-08-13 | Disposition: A | Discharge status: Home / Self Care | Payer: Medicare HMO | Source: Ambulatory Visit | Attending: Internal Medicine | Admitting: Internal Medicine

## 2020-08-13 DIAGNOSIS — Z20822 Contact with and (suspected) exposure to covid-19: Secondary | ICD-10-CM | POA: Diagnosis not present

## 2020-08-13 DIAGNOSIS — Z01812 Encounter for preprocedural laboratory examination: Secondary | ICD-10-CM | POA: Diagnosis not present

## 2020-08-13 LAB — SARS CORONAVIRUS 2 (TAT 6-24 HRS): SARS Coronavirus 2: NEGATIVE

## 2020-08-15 ENCOUNTER — Ambulatory Visit (HOSPITAL_COMMUNITY): Payer: Medicare HMO | Admitting: Certified Registered Nurse Anesthetist

## 2020-08-15 ENCOUNTER — Encounter (HOSPITAL_COMMUNITY)
Admission: RE | Disposition: A | Discharge status: Home / Self Care | Payer: Self-pay | Source: Home / Self Care | Attending: Internal Medicine

## 2020-08-15 ENCOUNTER — Ambulatory Visit (HOSPITAL_COMMUNITY)
Admission: RE | Admit: 2020-08-15 | Discharge: 2020-08-15 | Disposition: A | Discharge status: Home / Self Care | Payer: Medicare HMO | Attending: Internal Medicine | Admitting: Internal Medicine

## 2020-08-15 ENCOUNTER — Other Ambulatory Visit: Payer: Self-pay

## 2020-08-15 ENCOUNTER — Encounter (HOSPITAL_COMMUNITY): Payer: Self-pay | Admitting: Internal Medicine

## 2020-08-15 DIAGNOSIS — K640 First degree hemorrhoids: Secondary | ICD-10-CM | POA: Insufficient documentation

## 2020-08-15 DIAGNOSIS — Z79899 Other long term (current) drug therapy: Secondary | ICD-10-CM | POA: Insufficient documentation

## 2020-08-15 DIAGNOSIS — E039 Hypothyroidism, unspecified: Secondary | ICD-10-CM | POA: Diagnosis not present

## 2020-08-15 DIAGNOSIS — Z800 Family history of malignant neoplasm of digestive organs: Secondary | ICD-10-CM | POA: Diagnosis not present

## 2020-08-15 DIAGNOSIS — K529 Noninfective gastroenteritis and colitis, unspecified: Secondary | ICD-10-CM | POA: Diagnosis not present

## 2020-08-15 DIAGNOSIS — Z86010 Personal history of colonic polyps: Secondary | ICD-10-CM | POA: Insufficient documentation

## 2020-08-15 DIAGNOSIS — K5730 Diverticulosis of large intestine without perforation or abscess without bleeding: Secondary | ICD-10-CM | POA: Diagnosis not present

## 2020-08-15 DIAGNOSIS — Z09 Encounter for follow-up examination after completed treatment for conditions other than malignant neoplasm: Secondary | ICD-10-CM | POA: Diagnosis not present

## 2020-08-15 DIAGNOSIS — K6389 Other specified diseases of intestine: Secondary | ICD-10-CM | POA: Diagnosis not present

## 2020-08-15 DIAGNOSIS — Z87891 Personal history of nicotine dependence: Secondary | ICD-10-CM | POA: Insufficient documentation

## 2020-08-15 HISTORY — PX: COLONOSCOPY WITH PROPOFOL: SHX5780

## 2020-08-15 HISTORY — PX: BIOPSY: SHX5522

## 2020-08-15 SURGERY — COLONOSCOPY WITH PROPOFOL
Anesthesia: General

## 2020-08-15 MED ORDER — LIDOCAINE HCL (CARDIAC) PF 100 MG/5ML IV SOSY
PREFILLED_SYRINGE | INTRAVENOUS | Status: DC | PRN
  Administered 2020-08-15: 50 mg via INTRAVENOUS

## 2020-08-15 MED ORDER — PROPOFOL 10 MG/ML IV BOLUS
INTRAVENOUS | Status: DC | PRN
  Administered 2020-08-15: 20 mg via INTRAVENOUS
  Administered 2020-08-15: 80 mg via INTRAVENOUS

## 2020-08-15 MED ORDER — PROPOFOL 500 MG/50ML IV EMUL
INTRAVENOUS | Status: DC | PRN
  Administered 2020-08-15: 150 ug/kg/min via INTRAVENOUS

## 2020-08-15 MED ORDER — LACTATED RINGERS IV SOLN
INTRAVENOUS | Status: DC
  Administered 2020-08-15: 1000 mL via INTRAVENOUS

## 2020-08-15 NOTE — Anesthesia Postprocedure Evaluation (Signed)
Anesthesia Post Note  Patient: Julie Brown  Procedure(s) Performed: COLONOSCOPY WITH PROPOFOL (N/A ) BIOPSY  Patient location during evaluation: Phase II Anesthesia Type: General Level of consciousness: awake and alert and oriented Pain management: satisfactory to patient Vital Signs Assessment: post-procedure vital signs reviewed and stable Respiratory status: spontaneous breathing and respiratory function stable Cardiovascular status: blood pressure returned to baseline and stable Postop Assessment: no apparent nausea or vomiting Anesthetic complications: no   No complications documented.   Last Vitals:  Vitals:   08/15/20 1032 08/15/20 1149  BP: (!) 141/82 106/74  Pulse: 97 95  Resp: 20 18  Temp: 37 C 36.5 C  SpO2: 97% 97%    Last Pain:  Vitals:   08/15/20 1149  TempSrc: Oral  PainSc: 0-No pain                 Karna Dupes

## 2020-08-15 NOTE — Anesthesia Preprocedure Evaluation (Signed)
Anesthesia Evaluation  Patient identified by MRN, date of birth, ID band Patient awake    Reviewed: Allergy & Precautions, H&P , NPO status , Patient's Chart, lab work & pertinent test results, reviewed documented beta blocker date and time   History of Anesthesia Complications (+) PONV and history of anesthetic complications  Airway Mallampati: II  TM Distance: >3 FB Neck ROM: full    Dental no notable dental hx.    Pulmonary asthma , former smoker,    Pulmonary exam normal breath sounds clear to auscultation       Cardiovascular Exercise Tolerance: Good negative cardio ROS   Rhythm:regular Rate:Normal     Neuro/Psych  Headaches, negative psych ROS   GI/Hepatic negative GI ROS, Neg liver ROS,   Endo/Other  Hypothyroidism Morbid obesity  Renal/GU negative Renal ROS  negative genitourinary   Musculoskeletal   Abdominal   Peds  Hematology negative hematology ROS (+)   Anesthesia Other Findings   Reproductive/Obstetrics negative OB ROS                             Anesthesia Physical Anesthesia Plan  ASA: III  Anesthesia Plan: General   Post-op Pain Management:    Induction:   PONV Risk Score and Plan: Propofol infusion  Airway Management Planned:   Additional Equipment:   Intra-op Plan:   Post-operative Plan:   Informed Consent: I have reviewed the patients History and Physical, chart, labs and discussed the procedure including the risks, benefits and alternatives for the proposed anesthesia with the patient or authorized representative who has indicated his/her understanding and acceptance.     Dental Advisory Given  Plan Discussed with: CRNA  Anesthesia Plan Comments:         Anesthesia Quick Evaluation

## 2020-08-15 NOTE — Transfer of Care (Signed)
Immediate Anesthesia Transfer of Care Note  Patient: Julie Brown  Procedure(s) Performed: COLONOSCOPY WITH PROPOFOL (N/A ) BIOPSY  Patient Location: PACU  Anesthesia Type:General  Level of Consciousness: awake, alert  and oriented  Airway & Oxygen Therapy: Patient Spontanous Breathing  Post-op Assessment: Report given to RN and Post -op Vital signs reviewed and stable  Post vital signs: Reviewed and stable  Last Vitals:  Vitals Value Taken Time  BP    Temp    Pulse    Resp    SpO2      Last Pain:  Vitals:   08/15/20 1123  TempSrc:   PainSc: 0-No pain      Patients Stated Pain Goal: 8 (37/10/62 6948)  Complications: No complications documented.

## 2020-08-15 NOTE — Discharge Instructions (Signed)
Colonoscopy Discharge Instructions  Read the instructions outlined below and refer to this sheet in the next few weeks. These discharge instructions provide you with general information on caring for yourself after you leave the hospital. Your doctor may also give you specific instructions. While your treatment has been planned according to the most current medical practices available, unavoidable complications occasionally occur. If you have any problems or questions after discharge, call Dr. Gala Romney at [phone]. ACTIVITY  You may resume your regular activity, but move at a slower pace for the next 24 hours.   Take frequent rest periods for the next 24 hours.   Walking will help get rid of the air and reduce the bloated feeling in your belly (abdomen).   No driving for 24 hours (because of the medicine (anesthesia) used during the test).    Do not sign any important legal documents or operate any machinery for 24 hours (because of the anesthesia used during the test).  NUTRITION  Drink plenty of fluids.   You may resume your normal diet as instructed by your doctor.   Begin with a light meal and progress to your normal diet. Heavy or fried foods are harder to digest and may make you feel sick to your stomach (nauseated).   Avoid alcoholic beverages for 24 hours or as instructed.  MEDICATIONS  You may resume your normal medications unless your doctor tells you otherwise.  WHAT YOU CAN EXPECT TODAY  Some feelings of bloating in the abdomen.   Passage of more gas than usual.   Spotting of blood in your stool or on the toilet paper.  IF YOU HAD POLYPS REMOVED DURING THE COLONOSCOPY:  No aspirin products for 7 days or as instructed.   No alcohol for 7 days or as instructed.   Eat a soft diet for the next 24 hours.  FINDING OUT THE RESULTS OF YOUR TEST Not all test results are available during your visit. If your test results are not back during the visit, make an appointment  with your caregiver to find out the results. Do not assume everything is normal if you have not heard from your caregiver or the medical facility. It is important for you to follow up on all of your test results.  SEEK IMMEDIATE MEDICAL ATTENTION IF:  You have more than a spotting of blood in your stool.   Your belly is swollen (abdominal distention).   You are nauseated or vomiting.   You have a temperature over 101.   You have abdominal pain or discomfort that is severe or gets worse throughout the day.   No polyps found today  Recommend repeat colonoscopy in 5 years  Biopsies of your colon were taken to check for diarrhea  Diverticulosis and hemorrhoid information guided  Office visit with [place] in 3 months if not already scheduled  At patient request, I called Tiburcio Pea at [phone] -reviewed findings and recommendations   Hemorrhoids Hemorrhoids are swollen veins in and around the rectum or anus. There are two types of hemorrhoids:  Internal hemorrhoids. These occur in the veins that are just inside the rectum. They may poke through to the outside and become irritated and painful.  External hemorrhoids. These occur in the veins that are outside the anus and can be felt as a painful swelling or hard lump near the anus. Most hemorrhoids do not cause serious problems, and they can be managed with home treatments such as diet and lifestyle changes. If home treatments  do not help the symptoms, procedures can be done to shrink or remove the hemorrhoids. What are the causes? This condition is caused by increased pressure in the anal area. This pressure may result from various things, including:  Constipation.  Straining to have a bowel movement.  Diarrhea.  Pregnancy.  Obesity.  Sitting for long periods of time.  Heavy lifting or other activity that causes you to strain.  Anal sex.  Riding a bike for a long period of time. What are the signs or symptoms? Symptoms  of this condition include:  Pain.  Anal itching or irritation.  Rectal bleeding.  Leakage of stool (feces).  Anal swelling.  One or more lumps around the anus. How is this diagnosed? This condition can often be diagnosed through a visual exam. Other exams or tests may also be done, such as:  An exam that involves feeling the rectal area with a gloved hand (digital rectal exam).  An exam of the anal canal that is done using a small tube (anoscope).  A blood test, if you have lost a significant amount of blood.  A test to look inside the colon using a flexible tube with a camera on the end (sigmoidoscopy or colonoscopy). How is this treated? This condition can usually be treated at home. However, various procedures may be done if dietary changes, lifestyle changes, and other home treatments do not help your symptoms. These procedures can help make the hemorrhoids smaller or remove them completely. Some of these procedures involve surgery, and others do not. Common procedures include:  Rubber band ligation. Rubber bands are placed at the base of the hemorrhoids to cut off their blood supply.  Sclerotherapy. Medicine is injected into the hemorrhoids to shrink them.  Infrared coagulation. A type of light energy is used to get rid of the hemorrhoids.  Hemorrhoidectomy surgery. The hemorrhoids are surgically removed, and the veins that supply them are tied off.  Stapled hemorrhoidopexy surgery. The surgeon staples the base of the hemorrhoid to the rectal wall. Follow these instructions at home: Eating and drinking  Eat foods that have a lot of fiber in them, such as whole grains, beans, nuts, fruits, and vegetables.  Ask your health care provider about taking products that have added fiber (fiber supplements).  Reduce the amount of fat in your diet. You can do this by eating low-fat dairy products, eating less red meat, and avoiding processed foods.  Drink enough fluid to keep  your urine pale yellow.   Managing pain and swelling  Take warm sitz baths for 20 minutes, 3-4 times a day to ease pain and discomfort. You may do this in a bathtub or using a portable sitz bath that fits over the toilet.  If directed, apply ice to the affected area. Using ice packs between sitz baths may be helpful. ? Put ice in a plastic bag. ? Place a towel between your skin and the bag. ? Leave the ice on for 20 minutes, 2-3 times a day.   General instructions  Take over-the-counter and prescription medicines only as told by your health care provider.  Use medicated creams or suppositories as told.  Get regular exercise. Ask your health care provider how much and what kind of exercise is best for you. In general, you should do moderate exercise for at least 30 minutes on most days of the week (150 minutes each week). This can include activities such as walking, biking, or yoga.  Go to the bathroom when  you have the urge to have a bowel movement. Do not wait.  Avoid straining to have bowel movements.  Keep the anal area dry and clean. Use wet toilet paper or moist towelettes after a bowel movement.  Do not sit on the toilet for long periods of time. This increases blood pooling and pain.  Keep all follow-up visits as told by your health care provider. This is important. Contact a health care provider if you have:  Increasing pain and swelling that are not controlled by treatment or medicine.  Difficulty having a bowel movement, or you are unable to have a bowel movement.  Pain or inflammation outside the area of the hemorrhoids. Get help right away if you have:  Uncontrolled bleeding from your rectum. Summary  Hemorrhoids are swollen veins in and around the rectum or anus.  Most hemorrhoids can be managed with home treatments such as diet and lifestyle changes.  Taking warm sitz baths can help ease pain and discomfort.  In severe cases, procedures or surgery can be done  to shrink or remove the hemorrhoids. This information is not intended to replace advice given to you by your health care provider. Make sure you discuss any questions you have with your health care provider. Document Revised: 11/18/2018 Document Reviewed: 11/11/2017 Elsevier Patient Education  2021 [place].  Diverticulosis  Diverticulosis is a condition that develops when small pouches (diverticula) form in the wall of the large intestine (colon). The colon is where water is absorbed and stool (feces) is formed. The pouches form when the inside layer of the colon pushes through weak spots in the outer layers of the colon. You may have a few pouches or many of them. The pouches usually do not cause problems unless they become inflamed or infected. When this happens, the condition is called diverticulitis. What are the causes? The cause of this condition is not known. What increases the risk? The following factors may make you more likely to develop this condition:  Being older than age 27. Your risk for this condition increases with age. Diverticulosis is rare among people younger than age 65. By age 50, many people have it.  Eating a low-fiber diet.  Having frequent constipation.  Being overweight.  Not getting enough exercise.  Smoking.  Taking over-the-counter pain medicines, like aspirin and ibuprofen.  Having a family history of diverticulosis. What are the signs or symptoms? In most people, there are no symptoms of this condition. If you do have symptoms, they may include:  Bloating.  Cramps in the abdomen.  Constipation or diarrhea.  Pain in the lower left side of the abdomen. How is this diagnosed? Because diverticulosis usually has no symptoms, it is most often diagnosed during an exam for other colon problems. The condition may be diagnosed by:  Using a flexible scope to examine the colon (colonoscopy).  Taking an X-ray of the colon after dye has been put  into the colon (barium enema).  Having a CT scan. How is this treated? You may not need treatment for this condition. Your health care provider may recommend treatment to prevent problems. You may need treatment if you have symptoms or if you previously had diverticulitis. Treatment may include:  Eating a high-fiber diet.  Taking a fiber supplement.  Taking a live bacteria supplement (probiotic).  Taking medicine to relax your colon.   Follow these instructions at home: Medicines  Take over-the-counter and prescription medicines only as told by your health care provider.  If  told by your health care provider, take a fiber supplement or probiotic. Constipation prevention Your condition may cause constipation. To prevent or treat constipation, you may need to:  Drink enough fluid to keep your urine pale yellow.  Take over-the-counter or prescription medicines.  Eat foods that are high in fiber, such as beans, whole grains, and fresh fruits and vegetables.  Limit foods that are high in fat and processed sugars, such as fried or sweet foods.   General instructions  Try not to strain when you have a bowel movement.  Keep all follow-up visits as told by your health care provider. This is important. Contact a health care provider if you:  Have pain in your abdomen.  Have bloating.  Have cramps.  Have not had a bowel movement in 3 days. Get help right away if:  Your pain gets worse.  Your bloating becomes very bad.  You have a fever or chills, and your symptoms suddenly get worse.  You vomit.  You have bowel movements that are bloody or black.  You have bleeding from your rectum. Summary  Diverticulosis is a condition that develops when small pouches (diverticula) form in the wall of the large intestine (colon).  You may have a few pouches or many of them.  This condition is most often diagnosed during an exam for other colon problems.  Treatment may include  increasing the fiber in your diet, taking supplements, or taking medicines. This information is not intended to replace advice given to you by your health care provider. Make sure you discuss any questions you have with your health care provider. Document Revised: 01/19/2019 Document Reviewed: 01/19/2019 Elsevier Patient Education  [place].

## 2020-08-15 NOTE — H&P (Signed)
@LOGO @   Primary Care Physician:  Erven Colla, DO Primary Gastroenterologist:  Dr. Gala Romney  Pre-Procedure History & Physical: HPI:  Julie Brown is a 70 y.o. female here for surveillance colonoscopy.  History of colonic adenomas removed previously and a positive family history colon cancer in her mother at a young age.  Also chronic diarrhea.  Colestid has decreased frequency of diarrhea but she still has the diarrhea couple times a day.  No formed stools.  Sees blood only when she wipes after having multiple stools.  Past Medical History:  Diagnosis Date  . Asthma   . Hayfever   . Hyperlipidemia   . Migraine headache   . PONV (postoperative nausea and vomiting)     Past Surgical History:  Procedure Laterality Date  . BREAST SURGERY     lump removed  . CHOLECYSTECTOMY  01/06/2012   Procedure: LAPAROSCOPIC CHOLECYSTECTOMY;  Surgeon: Donato Heinz, MD;  Location: [facility];  Service: General;  Laterality: N/A;  . COLONOSCOPY  07/22/06   KNL:ZJQBHA and colon polyps/left-sided diverticula. adenomatous colon polyps. surveillance TCS due 2013  . COLONOSCOPY N/A 12/25/2015   sigmoid diverticulosis, multiple colonic polyps removed, s/p segmental colonic biopsies. Tubular adenomas, negative microscopic colitis. Surveillance 2022.   . ESOPHAGOGASTRODUODENOSCOPY  07/22/06   LPF:XTKWIO/XBDZH HH otherwise normal  . FOOT SURGERY     left foot  . MENISCUS REPAIR Right 12/28/2017  . TUBAL LIGATION      Prior to Admission medications   Medication Sig Start Date End Date Taking? Authorizing Provider  B Complex-Biotin-FA (SUPER B-COMPLEX PO) Take 1 tablet by mouth daily.    Yes [provider]  Biotin w/ Vitamins C & E (HAIR/SKIN/NAILS PO) Take 1 tablet by mouth daily.    Yes [provider]  calcium carbonate (TUMS - DOSED IN MG ELEMENTAL CALCIUM) 500 MG chewable tablet Chew 1-2 tablets by mouth daily.   Yes [provider]  cetirizine (ZYRTEC) 10 MG tablet Take 10  mg by mouth daily as needed for allergies.   Yes [provider]  colestipol (COLESTID) 1 g tablet Take 1 tablet (1 g total) by mouth daily. 05/07/20  Yes Annitta Needs, NP  Dupilumab (DUPIXENT) 300 MG/2ML SOPN Inject 300 mg into the skin every 14 (fourteen) days. 08/09/20  Yes Lavonna Monarch, MD  fluocinonide cream (LIDEX) 2.99 % Apply 1 application topically 2 (two) times daily. Patient taking differently: Apply 1 application topically daily as needed (eczema). 11/23/17  Yes Florian Buff, MD  furosemide (LASIX) 20 MG tablet Take 1 tablet (20 mg total) by mouth daily. 07/11/20  Yes Lovena Le, Malena M, DO  hydrocortisone (ANUSOL-HC) 2.5 % rectal cream Place 1 application rectally 2 (two) times daily. Patient taking differently: Place 1 application rectally 2 (two) times daily as needed for hemorrhoids. 05/07/20  Yes Annitta Needs, NP  Multiple Vitamins-Calcium (ONE-A-DAY WOMENS PO) Take 1 tablet by mouth daily.    Yes [provider]  Potassium 99 MG TABS Take 99 mg by mouth daily.   Yes [provider]  potassium chloride (KLOR-CON) 10 MEQ tablet Take 1 tablet (10 mEq total) by mouth daily. 07/11/20  Yes Lovena Le, Malena M, DO  Probiotic Product (DIGESTIVE ADVANTAGE PO) Take 1 capsule by mouth daily.   Yes [provider]  triamcinolone cream (KENALOG) 0.1 % Apply 1 application topically daily as needed (eczema).  04/06/19  Yes [provider]    Allergies as of 05/16/2020 - Review Complete 05/16/2020  Allergen Reaction Noted  . Codeine Nausea And Vomiting   . Hydrocodone Nausea And Vomiting 11/25/2012    Family History  Problem Relation Age of Onset  . Colon cancer Mother        age 24  . Kidney cancer Mother        cause of death, 21 cancer surgeries, died at age 49  . Diabetes Brother   . Hypertension Brother   . Cancer Brother     Social History   Socioeconomic History  . Marital status: Married    Spouse name: Not on file  . Number of  children: 1  . Years of education: Not on file  . Highest education level: Not on file  Occupational History  . Occupation: bookeeping   Tobacco Use  . Smoking status: Former Research scientist (life sciences)  . Smokeless tobacco: Never Used  . Tobacco comment: quit 1990  Substance and Sexual Activity  . Alcohol use: Not Currently    Alcohol/week: 0.0 standard drinks    Comment: occasional, not daily  . Drug use: No  . Sexual activity: Not Currently    Birth control/protection: None  Other Topics Concern  . Not on file  Social History Narrative  . Not on file   Social Determinants of Health   Financial Resource Strain: Not on file  Food Insecurity: Not on file  Transportation Needs: Not on file  Physical Activity: Not on file  Stress: Not on file  Social Connections: Not on file  Intimate Partner Violence: Not on file    Review of Systems: See HPI, otherwise negative ROS  Physical Exam: BP (!) 141/82   Pulse 97   Temp 98.6 F (37 C) (Oral)   Resp 20   SpO2 97%  General:   Alert,  Well-developed, well-nourished, pleasant and cooperative in NAD Skin:  Intact without significant lesions or rashes. Lungs:  Clear throughout to auscultation.   No wheezes, crackles, or rhonchi. No acute distress. Heart:  Regular rate and rhythm; no murmurs, clicks, rubs,  or gallops. Abdomen: Non-distended, normal bowel sounds.  Soft and nontender without appreciable mass or hepatosplenomegaly.  Pulses:  Normal pulses noted. Extremities:  Without clubbing or edema.  Impression/Plan: 70 year old lady here for surveillance colonoscopy.  Paper hematochezia.  Chronic diarrhea. Anoscopy indicated at this time.  I have offered the patient a colonoscopy today The risks, benefits, limitations, alternatives and imponderables have been reviewed with the patient. Questions have been answered. All parties are agreeable.   Further recommendations to follow.     Notice: This dictation was prepared with Dragon dictation  along with smaller phrase technology. Any transcriptional errors that result from this process are unintentional and may not be corrected upon review.

## 2020-08-15 NOTE — Op Note (Signed)
[facility] Patient Name: Julie Brown Procedure Date: 08/15/2020 10:42 AM MRN: [id number] Date of Birth: 1950-07-25 Attending MD: Norvel Richards , MD CSN: [id number] Age: 70 Admit Type: Outpatient Procedure:                Colonoscopy Indications:              High risk colon cancer surveillance: Personal                            history of colonic polyps; positive family history                            of colon cancer in a first-degree relative at a                            young age ; chronic diarrhea Providers:                Norvel Richards, MD, Otis Peak B. Gwenlyn Perking Therapist, sports, RN,                            Suzan Garibaldi. Risa Grill, Technician Referring MD:              Medicines:                Propofol per Anesthesia Complications:            No immediate complications. Estimated Blood Loss:     Estimated blood loss was minimal. Procedure:                Pre-Anesthesia Assessment:                           - Prior to the procedure, a History and Physical                            was performed, and patient medications and                            allergies were reviewed. The patient's tolerance of                            previous anesthesia was also reviewed. The risks                            and benefits of the procedure and the sedation                            options and risks were discussed with the patient.                            All questions were answered, and informed consent                            was obtained. Prior Anticoagulants: The patient has  taken no previous anticoagulant or antiplatelet                            agents. ASA Grade Assessment: III - A patient with                            severe systemic disease. After reviewing the risks                            and benefits, the patient was deemed in                            satisfactory condition to undergo the procedure.                            After obtaining informed consent, the colonoscope                            was passed under direct vision. Throughout the                            procedure, the patient's blood pressure, pulse, and                            oxygen saturations were monitored continuously. The                            [id number]-[id number] ([id number]) scope was introduced through                            the anus and advanced to the 10 cm into the ileum.                            The colonoscopy was performed without difficulty.                            The patient tolerated the procedure well. The                            quality of the bowel preparation was adequate. Scope In: 11:27:25 AM Scope Out: 11:45:36 AM Scope Withdrawal Time: 0 hours 8 minutes 55 seconds  Total Procedure Duration: 0 hours 18 minutes 11 seconds  Findings:      The perianal and digital rectal examinations were normal.      Scattered medium-mouthed diverticula were found in the sigmoid colon and       descending colon.      The exam was otherwise normal throughout the examined colon. Grade 1       hemorrhoids found on retroflexion. Segmental biopsies of the right and       left colon taken for histologic study. Still 10 cm of terminal ileum       appeared normal Impression:               - Diverticulosis in the sigmoid colon and in the  descending colon.                           -Status post segmental biopsy. Normal terminal ileum Moderate Sedation:      Moderate (conscious) sedation was personally administered by an       anesthesia professional. The following parameters were monitored: oxygen       saturation, heart rate, blood pressure, respiratory rate, EKG, adequacy       of pulmonary ventilation, and response to care. Recommendation:           - Patient has a contact number available for                            emergencies. The signs and symptoms of potential                             delayed complications were discussed with the                            patient. Return to normal activities tomorrow.                            Written discharge instructions were provided to the                            patient.                           - Advance diet as tolerated.                           - Continue present medications.                           - Repeat colonoscopy in 5 years for surveillance.                           - Return to GI office in 3 months. Procedure Code(s):        --- Professional ---                           [phone], Colonoscopy, flexible; diagnostic, including                            collection of specimen(s) by brushing or washing,                            when performed (separate procedure) Diagnosis Code(s):        --- Professional ---                           Z86.010, Personal history of colonic polyps                           K57.30, Diverticulosis of large intestine without  perforation or abscess without bleeding CPT copyright 2019 American Medical Association. All rights reserved. The codes documented in this report are preliminary and upon coder review may  be revised to meet current compliance requirements. Cristopher Estimable. Rourk, MD Norvel Richards, MD 08/15/2020 11:56:13 AM This report has been signed electronically. Number of Addenda: 0

## 2020-08-16 ENCOUNTER — Encounter: Payer: Self-pay | Admitting: Internal Medicine

## 2020-08-16 LAB — SURGICAL PATHOLOGY

## 2020-08-20 ENCOUNTER — Encounter (HOSPITAL_COMMUNITY): Payer: Self-pay | Admitting: Internal Medicine

## 2020-09-17 DIAGNOSIS — G8929 Other chronic pain: Secondary | ICD-10-CM | POA: Diagnosis not present

## 2020-09-17 DIAGNOSIS — Z800 Family history of malignant neoplasm of digestive organs: Secondary | ICD-10-CM | POA: Diagnosis not present

## 2020-09-17 DIAGNOSIS — E785 Hyperlipidemia, unspecified: Secondary | ICD-10-CM | POA: Diagnosis not present

## 2020-09-17 DIAGNOSIS — M1990 Unspecified osteoarthritis, unspecified site: Secondary | ICD-10-CM | POA: Diagnosis not present

## 2020-09-17 DIAGNOSIS — R32 Unspecified urinary incontinence: Secondary | ICD-10-CM | POA: Diagnosis not present

## 2020-09-17 DIAGNOSIS — H547 Unspecified visual loss: Secondary | ICD-10-CM | POA: Diagnosis not present

## 2020-09-17 DIAGNOSIS — R197 Diarrhea, unspecified: Secondary | ICD-10-CM | POA: Diagnosis not present

## 2020-09-17 DIAGNOSIS — L309 Dermatitis, unspecified: Secondary | ICD-10-CM | POA: Diagnosis not present

## 2020-09-17 DIAGNOSIS — R609 Edema, unspecified: Secondary | ICD-10-CM | POA: Diagnosis not present

## 2020-10-22 ENCOUNTER — Encounter: Payer: Self-pay | Admitting: Family Medicine

## 2020-10-22 ENCOUNTER — Other Ambulatory Visit: Payer: Self-pay

## 2020-10-22 ENCOUNTER — Ambulatory Visit (INDEPENDENT_AMBULATORY_CARE_PROVIDER_SITE_OTHER): Payer: Medicare HMO | Admitting: Family Medicine

## 2020-10-22 VITALS — BP 126/84 | HR 101 | Temp 97.2°F | Wt 238.2 lb

## 2020-10-22 DIAGNOSIS — H66001 Acute suppurative otitis media without spontaneous rupture of ear drum, right ear: Secondary | ICD-10-CM

## 2020-10-22 DIAGNOSIS — J301 Allergic rhinitis due to pollen: Secondary | ICD-10-CM | POA: Insufficient documentation

## 2020-10-22 MED ORDER — FLUTICASONE PROPIONATE 50 MCG/ACT NA SUSP: 2.0000 | Freq: Every day | NASAL | 1 refills | Status: DC

## 2020-10-22 MED ORDER — AMOXICILLIN-POT CLAVULANATE 875-125 MG PO TABS: 1.0000 | ORAL_TABLET | Freq: Two times a day (BID) | ORAL | 0 refills | Status: DC

## 2020-10-22 MED ORDER — MONTELUKAST SODIUM 10 MG PO TABS: 10.0000 mg | ORAL_TABLET | Freq: Every day | ORAL | 3 refills | Status: DC

## 2020-10-22 NOTE — Progress Notes (Signed)
Pt having her "yearly pollen event". Pt states she is having cough, ears burning, and its "all in her head". Pt is taking Robitussin, Nyquil and drinking tons of water.    Patient ID: Julie Brown, female    DOB: 07-03-1951, 70 y.o.   MRN: [id number]   Chief Complaint  Patient presents with  . Sinusitis   Subjective:  CC: allergies, cough, ears burning  This is a new problem.  Presents today with a complaint of cough, and ears burning.  Right ear worse than left.  Reports that cough is a little better today.  Does have a history of seasonal allergic rhinitis, takes Zyrtec at night.  Has also been taken Robitussin and NyQuil and lots of water.  Denies fever, chills.  Endorses congestion, postnasal drip, rhinorrhea, sinus pain and pressure, sore throat, and ear burning.    Medical History Danine has a past medical history of Asthma, Hayfever, Hyperlipidemia, Migraine headache, and PONV (postoperative nausea and vomiting).   Outpatient Encounter Medications as of 10/22/2020  Medication Sig  . amoxicillin-clavulanate (AUGMENTIN) 875-125 MG tablet Take 1 tablet by mouth 2 (two) times daily.  . B Complex-Biotin-FA (SUPER B-COMPLEX PO) Take 1 tablet by mouth daily.   . Biotin w/ Vitamins C & E (HAIR/SKIN/NAILS PO) Take 1 tablet by mouth daily.   . calcium carbonate (TUMS - DOSED IN MG ELEMENTAL CALCIUM) 500 MG chewable tablet Chew 1-2 tablets by mouth daily.  . cetirizine (ZYRTEC) 10 MG tablet Take 10 mg by mouth daily as needed for allergies.  . colestipol (COLESTID) 1 g tablet Take 1 tablet (1 g total) by mouth daily.  . Dupilumab (DUPIXENT) 300 MG/2ML SOPN Inject 300 mg into the skin every 14 (fourteen) days.  . fluocinonide cream (LIDEX) 2.84 % Apply 1 application topically 2 (two) times daily. (Patient taking differently: Apply 1 application topically daily as needed (eczema).)  . fluticasone (FLONASE) 50 MCG/ACT nasal spray Place 2 sprays into both nostrils daily.  . furosemide (LASIX)  20 MG tablet Take 1 tablet (20 mg total) by mouth daily.  . hydrocortisone (ANUSOL-HC) 2.5 % rectal cream Place 1 application rectally 2 (two) times daily. (Patient taking differently: Place 1 application rectally 2 (two) times daily as needed for hemorrhoids.)  . montelukast (SINGULAIR) 10 MG tablet Take 1 tablet (10 mg total) by mouth at bedtime.  . Multiple Vitamins-Calcium (ONE-A-DAY WOMENS PO) Take 1 tablet by mouth daily.   . Potassium 99 MG TABS Take 99 mg by mouth daily.  . potassium chloride (KLOR-CON) 10 MEQ tablet Take 1 tablet (10 mEq total) by mouth daily.  . Probiotic Product (DIGESTIVE ADVANTAGE PO) Take 1 capsule by mouth daily.  Marland Kitchen triamcinolone cream (KENALOG) 0.1 % Apply 1 application topically daily as needed (eczema).    No facility-administered encounter medications on file as of 10/22/2020.     Review of Systems  Constitutional: Negative for chills and fever.  HENT: Positive for congestion, postnasal drip, rhinorrhea, sinus pressure, sinus pain, sore throat and voice change. Negative for ear pain.   Respiratory: Positive for cough and shortness of breath.        Feels short of breath with deep breath and cough     Vitals BP 126/84   Pulse (!) 101   Temp (!) 97.2 F (36.2 C)   Wt 238 lb 3.2 oz (108 kg)   SpO2 96%   BMI 40.89 kg/m   Objective:   Physical Exam Vitals reviewed.  Constitutional:  Appearance: Normal appearance.  HENT:     Right Ear: Tenderness present. Tympanic membrane is erythematous.     Left Ear: Tympanic membrane normal.     Nose:     Right Turbinates: Swollen.     Left Turbinates: Swollen.     Right Sinus: Frontal sinus tenderness present. No maxillary sinus tenderness.     Left Sinus: Frontal sinus tenderness present. No maxillary sinus tenderness.     Mouth/Throat:     Pharynx: No oropharyngeal exudate.     Tonsils: No tonsillar exudate. 1+ on the right. 1+ on the left.  Cardiovascular:     Rate and Rhythm: Normal rate and  regular rhythm.     Heart sounds: Normal heart sounds.  Pulmonary:     Effort: Pulmonary effort is normal.     Breath sounds: Normal breath sounds.  Skin:    General: Skin is warm and dry.  Neurological:     General: No focal deficit present.     Mental Status: She is alert.  Psychiatric:        Behavior: Behavior normal.      Assessment and Plan   1. Seasonal allergic rhinitis due to pollen - montelukast (SINGULAIR) 10 MG tablet; Take 1 tablet (10 mg total) by mouth at bedtime.  Dispense: 30 tablet; Refill: 3 - fluticasone (FLONASE) 50 MCG/ACT nasal spray; Place 2 sprays into both nostrils daily.  Dispense: 16 g; Refill: 1  2. Non-recurrent acute suppurative otitis media of right ear without spontaneous rupture of tympanic membrane - amoxicillin-clavulanate (AUGMENTIN) 875-125 MG tablet; Take 1 tablet by mouth 2 (two) times daily.  Dispense: 20 tablet; Refill: 0   Right TM erythematous, tender to touch, will treat otitis media with antibiotic for 10 days.  Seasonal allergic rhinitis, will start montelukast and Flonase during pollen season.  No obvious shortness of breath in office today, able to converse throughout visit without issue.  Agrees with plan of care discussed today. Understands warning signs to seek further care: chest pain, shortness of breath, any significant change in health.  Understands to follow-up if symptoms do not improve, or worsen.   Chalmers Guest, NP 10/22/2020

## 2020-10-22 NOTE — Patient Instructions (Signed)
Allergic Rhinitis, Adult Allergic rhinitis is a reaction to allergens. Allergens are things that can cause an allergic reaction. This condition affects the lining inside the nose (mucous membrane). There are two types of allergic rhinitis:  Seasonal. This type is also called hay fever. It happens only during some times of the year.  Perennial. This type can happen at any time of the year. This condition cannot be spread from person to person (is not contagious). It can be mild, worse, or very bad. It can develop at any age and may be outgrown. What are the causes? This condition may be caused by:  Pollen from grasses, trees, and weeds.  Dust mites.  Smoke.  Mold.  Car fumes.  The pee (urine), spit, or dander of pets. Dander is dead skin cells from a pet.   What increases the risk? You are more likely to develop this condition if:  You have allergies in your family.  You have problems like allergies in your family. You may have: ? Swelling of parts of your eyes and eyelids. ? Asthma. This affects how you breathe. ? Long-term redness and swelling on your skin. ? Food allergies. What are the signs or symptoms? The main symptom of this condition is a runny or stuffy nose (nasal congestion). Other symptoms may include:  Sneezing or coughing.  Itching and tearing of your eyes.  Mucus that drips down the back of your throat (postnasal drip).  Trouble sleeping.  Feeling tired.  Headache.  Sore throat. How is this treated? There is no cure for this condition. You should avoid things that you are allergic to. Treatment can help to relieve symptoms. This may include:  Medicines that block allergy symptoms, such as corticosteroids or antihistamines. These may be given as a shot, nasal spray, or pill.  Avoiding things you are allergic to.  Medicines that give you bits of what you are allergic to over time. This is called immunotherapy. It is done if other treatments do not  help. You may get: ? Shots. ? Medicine under your tongue.  Stronger medicines, if other treatments do not help. Follow these instructions at home: Avoiding allergens Find out what things you are allergic to and avoid them. To do this, try these things:  If you get allergies any time of year: ? Replace carpet with wood, tile, or vinyl flooring. Carpet can trap pet dander and dust. ? Do not smoke. Do not allow smoking in your home. ? Change your heating and air conditioning filters at least once a month.  If you get allergies only some times of the year: ? Keep windows closed when you can. ? Plan things to do outside when pollen counts are lowest. Check pollen counts before you plan things to do outside. ? When you come indoors, change your clothes and shower before you sit on furniture or bedding.   If you are allergic to a pet: ? Keep the pet out of your bedroom. ? Vacuum, sweep, and dust often.   General instructions  Take over-the-counter and prescription medicines only as told by your doctor.  Drink enough fluid to keep your pee (urine) pale yellow.  Keep all follow-up visits as told by your doctor. This is important. Where to find more information  American Academy of Allergy, Asthma & Immunology: www.aaaai.org Contact a doctor if:  You have a fever.  You get a cough that does not go away.  You make whistling sounds when you breathe (wheeze).  Your   symptoms slow you down.  Your symptoms stop you from doing your normal things each day. Get help right away if:  You are short of breath. This symptom may be an emergency. Do not wait to see if the symptom will go away. Get medical help right away. Call your local emergency services (911 in the [place] Do not drive yourself to the hospital. Summary  Allergic rhinitis may be treated by taking medicines and avoiding things you are allergic to.  If you have allergies only some of the year, keep windows closed when you  can at those times.  Contact your doctor if you get a fever or a cough that does not go away. This information is not intended to replace advice given to you by your health care provider. Make sure you discuss any questions you have with your health care provider. Document Revised: 08/14/2019 Document Reviewed: 06/20/2019 Elsevier Patient Education  2021 [place]. Otitis Media, Adult  Otitis media is a condition in which the middle ear is red and swollen (inflamed) and full of fluid. The middle ear is the part of the ear that contains bones for hearing as well as air that helps send sounds to the brain. The condition usually goes away on its own. What are the causes? This condition is caused by a blockage in the eustachian tube. The eustachian tube connects the middle ear to the back of the nose. It normally allows air into the middle ear. The blockage is caused by fluid or swelling. Problems that can cause blockage include:  A cold or infection that affects the nose, mouth, or throat.  Allergies.  An irritant, such as tobacco smoke.  Adenoids that have become large. The adenoids are soft tissue located in the back of the throat, behind the nose and the roof of the mouth.  Growth or swelling in the upper part of the throat, just behind the nose (nasopharynx).  Damage to the ear caused by change in pressure. This is called barotrauma. What are the signs or symptoms? Symptoms of this condition include:  Ear pain.  Fever.  Problems with hearing.  Being tired.  Fluid leaking from the ear.  Ringing in the ear. How is this treated? This condition can go away on its own within 3-5 days. But if the condition is caused by bacteria or does not go away on its own, or if it keeps coming back, your doctor may:  Give you antibiotic medicines.  Give you medicines for pain. Follow these instructions at home:  Take over-the-counter and prescription medicines only as told by your  doctor.  If you were prescribed an antibiotic medicine, take it as told by your doctor. Do not stop taking the antibiotic even if you start to feel better.  Keep all follow-up visits as told by your doctor. This is important. Contact a doctor if:  You have bleeding from your nose.  There is a lump on your neck.  You are not feeling better in 5 days.  You feel worse instead of better. Get help right away if:  You have pain that is not helped with medicine.  You have swelling, redness, or pain around your ear.  You get a stiff neck.  You cannot move part of your face (paralysis).  You notice that the bone behind your ear hurts when you touch it.  You get a very bad headache. Summary  Otitis media means that the middle ear is red, swollen, and full of  fluid.  This condition usually goes away on its own.  If the problem does not go away, treatment may be needed. You may be given medicines to treat the infection or to treat your pain.  If you were prescribed an antibiotic medicine, take it as told by your doctor. Do not stop taking the antibiotic even if you start to feel better.  Keep all follow-up visits as told by your doctor. This is important. This information is not intended to replace advice given to you by your health care provider. Make sure you discuss any questions you have with your health care provider. Document Revised: 05/25/2019 Document Reviewed: 05/25/2019 Elsevier Patient Education  2021 [employer].

## 2020-10-28 ENCOUNTER — Telehealth: Payer: Self-pay | Admitting: *Deleted

## 2020-10-28 NOTE — Telephone Encounter (Signed)
Patient called back to say "Thank you" to Lauren for helping get her with [facility] to get her medication.

## 2020-10-28 NOTE — Telephone Encounter (Signed)
Patient calling stating [facility] will not ship dupixent pen because prescription states 3.92 mls and it should be 29ml. Icalled [facility] pharmacy informed [facility] pharmacists that we arent able to change the mls on the prescription.  They verified prescription and will ship to patient.

## 2020-10-28 NOTE — Telephone Encounter (Signed)
error 

## 2020-10-31 ENCOUNTER — Ambulatory Visit (INDEPENDENT_AMBULATORY_CARE_PROVIDER_SITE_OTHER): Payer: Medicare HMO | Admitting: Family Medicine

## 2020-10-31 ENCOUNTER — Encounter: Payer: Self-pay | Admitting: Family Medicine

## 2020-10-31 ENCOUNTER — Other Ambulatory Visit: Payer: Self-pay

## 2020-10-31 VITALS — Temp 100.1°F | Wt 232.2 lb

## 2020-10-31 DIAGNOSIS — J302 Other seasonal allergic rhinitis: Secondary | ICD-10-CM

## 2020-10-31 DIAGNOSIS — H66001 Acute suppurative otitis media without spontaneous rupture of ear drum, right ear: Secondary | ICD-10-CM

## 2020-10-31 MED ORDER — ALBUTEROL SULFATE HFA 108 (90 BASE) MCG/ACT IN AERS: 2.0000 | INHALATION_SPRAY | Freq: Four times a day (QID) | RESPIRATORY_TRACT | 0 refills | Status: DC | PRN

## 2020-10-31 MED ORDER — PREDNISONE 20 MG PO TABS: ORAL_TABLET | ORAL | 0 refills | Status: DC

## 2020-10-31 NOTE — Progress Notes (Signed)
Patient ID: Julie Brown, female    DOB: March 17, 1951, 70 y.o.   MRN: [id number]   Chief Complaint  Patient presents with  . Cough   Subjective:    HPI   Pt here to recheck cough and sinus infection. Pt states she will be fine one minute and then back to "this scratchy, dripping stuff". Pt has 2 pills left of Augmentin. No fever.   Feeling coughing is persisting. Seen by NP Santiago Glad for uri and given augmentin.  Has 2 pills left.  Pt had ear pain and sinus issues and given tx.   Now just rt sided feeling. Clogged in ear and rt sinus.  Using flonase. Lost of voice and post nasal drip and blowing a lot from nose.  Has h/o allergies. Was given singular on last visit.   Mostly during the day having coughing.  Medical History Julie Brown has a past medical history of Asthma, Hayfever, Hyperlipidemia, Migraine headache, and PONV (postoperative nausea and vomiting).   Outpatient Encounter Medications as of 10/31/2020  Medication Sig  . albuterol (VENTOLIN HFA) 108 (90 Base) MCG/ACT inhaler Inhale 2 puffs into the lungs every 6 (six) hours as needed for wheezing or shortness of breath.  . B Complex-Biotin-FA (SUPER B-COMPLEX PO) Take 1 tablet by mouth daily.   . Biotin w/ Vitamins C & E (HAIR/SKIN/NAILS PO) Take 1 tablet by mouth daily.   . calcium carbonate (TUMS - DOSED IN MG ELEMENTAL CALCIUM) 500 MG chewable tablet Chew 1-2 tablets by mouth daily.  . colestipol (COLESTID) 1 g tablet Take 1 tablet (1 g total) by mouth daily. (Patient taking differently: Take 1 g by mouth daily. As needed)  . Dupilumab (DUPIXENT) 300 MG/2ML SOPN Inject 300 mg into the skin every 14 (fourteen) days.  . fluocinonide cream (LIDEX) 6.29 % Apply 1 application topically 2 (two) times daily. (Patient taking differently: Apply 1 application topically daily as needed (eczema).)  . fluticasone (FLONASE) 50 MCG/ACT nasal spray Place 2 sprays into both nostrils daily.  . furosemide (LASIX) 20 MG tablet Take 1  tablet (20 mg total) by mouth daily.  . hydrocortisone (ANUSOL-HC) 2.5 % rectal cream Place 1 application rectally 2 (two) times daily. (Patient taking differently: Place 1 application rectally 2 (two) times daily as needed for hemorrhoids.)  . montelukast (SINGULAIR) 10 MG tablet Take 1 tablet (10 mg total) by mouth at bedtime.  . Multiple Vitamins-Calcium (ONE-A-DAY WOMENS PO) Take 1 tablet by mouth daily.   . Potassium 99 MG TABS Take 99 mg by mouth daily.  . Probiotic Product (DIGESTIVE ADVANTAGE PO) Take 1 capsule by mouth daily.  Marland Kitchen triamcinolone cream (KENALOG) 0.1 % Apply 1 application topically daily as needed (eczema).   . [DISCONTINUED] amoxicillin-clavulanate (AUGMENTIN) 875-125 MG tablet Take 1 tablet by mouth 2 (two) times daily.  . [DISCONTINUED] cetirizine (ZYRTEC) 10 MG tablet Take 10 mg by mouth daily as needed for allergies. (Patient not taking: Reported on 11/12/2020)  . [DISCONTINUED] potassium chloride (KLOR-CON) 10 MEQ tablet Take 1 tablet (10 mEq total) by mouth daily. (Patient not taking: Reported on 11/12/2020)  . [DISCONTINUED] predniSONE (DELTASONE) 20 MG tablet Take 2 tab p.o. for 5 days. (Patient not taking: Reported on 11/12/2020)   No facility-administered encounter medications on file as of 10/31/2020.     Review of Systems  Constitutional: Negative for chills and fever.  HENT: Positive for congestion, postnasal drip and voice change. Negative for rhinorrhea and sore throat.   Respiratory: Positive for  cough. Negative for shortness of breath and wheezing.   Cardiovascular: Negative for chest pain and leg swelling.  Gastrointestinal: Negative for abdominal pain, diarrhea, nausea and vomiting.  Genitourinary: Negative for dysuria and frequency.  Musculoskeletal: Negative for arthralgias and back pain.  Skin: Negative for rash.  Neurological: Negative for dizziness, weakness and headaches.     Vitals Temp 100.1 F (37.8 C)   Wt 232 lb 3.2 oz (105.3 kg)   BMI  39.86 kg/m   Objective:   Physical Exam Vitals and nursing note reviewed.  Constitutional:      General: She is not in acute distress.    Appearance: Normal appearance. She is not ill-appearing.  HENT:     Head: Normocephalic and atraumatic.     Right Ear: Tympanic membrane, ear canal and external ear normal.     Left Ear: Tympanic membrane, ear canal and external ear normal.     Nose: Nose normal. No congestion or rhinorrhea.     Mouth/Throat:     Mouth: Mucous membranes are moist.     Pharynx: Oropharynx is clear. No oropharyngeal exudate or posterior oropharyngeal erythema.  Eyes:     Extraocular Movements: Extraocular movements intact.     Conjunctiva/sclera: Conjunctivae normal.     Pupils: Pupils are equal, round, and reactive to light.  Cardiovascular:     Rate and Rhythm: Normal rate and regular rhythm.     Pulses: Normal pulses.     Heart sounds: Normal heart sounds.  Pulmonary:     Effort: Pulmonary effort is normal.     Breath sounds: Normal breath sounds. No wheezing, rhonchi or rales.  Musculoskeletal:        General: Normal range of motion.     Right lower leg: No edema.     Left lower leg: No edema.  Skin:    General: Skin is warm and dry.     Findings: No lesion or rash.  Neurological:     General: No focal deficit present.     Mental Status: She is alert and oriented to person, place, and time.  Psychiatric:        Mood and Affect: Mood normal.        Behavior: Behavior normal.      Assessment and Plan   1. Non-recurrent acute suppurative otitis media of right ear without spontaneous rupture of tympanic membrane  2. Seasonal allergic rhinitis, unspecified trigger - albuterol (VENTOLIN HFA) 108 (90 Base) MCG/ACT inhaler; Inhale 2 puffs into the lungs every 6 (six) hours as needed for wheezing or shortness of breath.  Dispense: 18 g; Refill: 0    OM- Pt to cont to finish augmentin, has 2 more tabs from last visit on 10/22/20. Gave prednisone 40mg   for 5 days. Gave inhaler albuterol to help with coughing. Cont flonase, allergy meds. Cont all other otc meds.   Return if symptoms worsen or fail to improve.

## 2020-11-06 ENCOUNTER — Ambulatory Visit: Payer: Medicare HMO | Admitting: Family Medicine

## 2020-11-06 MED ORDER — CEFDINIR 300 MG PO CAPS: 300.0000 mg | ORAL_CAPSULE | Freq: Two times a day (BID) | ORAL | 0 refills | Status: DC

## 2020-11-06 NOTE — Telephone Encounter (Signed)
Called and pt did want to cancel appt. Note sent to front to cancel appt today.

## 2020-11-11 ENCOUNTER — Encounter: Payer: Self-pay | Admitting: Gastroenterology

## 2020-11-11 NOTE — Progress Notes (Signed)
Primary Care Physician:  Erven Colla, DO  Primary GI: Dr. Gala Romney  Patient Location: Home   Provider Location: [facility] office   Reason for Visit: Follow-up   Persons present on the virtual encounter, with roles: patient and NP   Total time (minutes) spent on medical discussion: 10 minutes   Due to COVID-19, visit was conducted using virtual method.  Visit was requested by patient.  Virtual Visit via Telephone Note Warehouse manager difficulties, converted to phone) Due to COVID-19, visit is conducted virtually and was requested by patient.   I connected with Julie Brown on 11/12/20 at 10:00 AM EDT by telephone and verified that I am speaking with the correct person using two identifiers.   I discussed the limitations, risks, security and privacy concerns of performing an evaluation and management service by telephone and the availability of in person appointments. I also discussed with the patient that there may be a patient responsible charge related to this service. The patient expressed understanding and agreed to proceed.  Chief Complaint  Patient presents with  . Follow-up    Current on ABX for sinus/ear infection and it is tearing up her stomach     History of Present Illness: Very pleasant 70 year old female with history of chronic diarrhea dating back to cholecystectomy in 2013. Responded well to Colestid 1 gram daily several years ago, history of multiple colon polyps in 2017 and most recent surveillance completed Feb 2022 with Sigmoid and descending colon diverticulosis, s/p segmental biopsy. 5 year colonoscopy. Biopsy without microscopic colitis.   On omnicef for sinus infection and almost done. Worsening diarrhea on abx. Taking Colestid for a few days and then off a few days. Trying to even it out. No rectal bleeding. Hemorrhoid symptoms much improved. Will get constipated on the Colestid if taking it every day.   Past Medical History:  Diagnosis Date   . Asthma   . Hayfever   . Hyperlipidemia   . Migraine headache   . PONV (postoperative nausea and vomiting)      Past Surgical History:  Procedure Laterality Date  . BIOPSY  08/15/2020   Procedure: BIOPSY;  Surgeon: Daneil Dolin, MD;  Location: [facility] SUITE;  Service: Endoscopy;;  . BREAST SURGERY     lump removed  . CHOLECYSTECTOMY  01/06/2012   Procedure: LAPAROSCOPIC CHOLECYSTECTOMY;  Surgeon: Donato Heinz, MD;  Location: [facility] ORS;  Service: General;  Laterality: N/A;  . COLONOSCOPY  07/22/06   QQV:ZDGLOV and colon polyps/left-sided diverticula. adenomatous colon polyps. surveillance TCS due 2013  . COLONOSCOPY N/A 12/25/2015   sigmoid diverticulosis, multiple colonic polyps removed, s/p segmental colonic biopsies. Tubular adenomas, negative microscopic colitis. Surveillance 2022.   Marland Kitchen COLONOSCOPY WITH PROPOFOL N/A 08/15/2020   Sigmoid and descending colon diverticulosis, s/p segmental biopsy. 5 year colonoscopy. Biopsy without microscopic colitis.   Marland Kitchen ESOPHAGOGASTRODUODENOSCOPY  07/22/06   FIE:PPIRJJ/OACZY HH otherwise normal  . FOOT SURGERY     left foot  . MENISCUS REPAIR Right 12/28/2017  . TUBAL LIGATION       Current Meds  Medication Sig  . albuterol (VENTOLIN HFA) 108 (90 Base) MCG/ACT inhaler Inhale 2 puffs into the lungs every 6 (six) hours as needed for wheezing or shortness of breath.  . B Complex-Biotin-FA (SUPER B-COMPLEX PO) Take 1 tablet by mouth daily.   . Biotin w/ Vitamins C & E (HAIR/SKIN/NAILS PO) Take 1 tablet by mouth daily.   . calcium carbonate (TUMS - DOSED IN  MG ELEMENTAL CALCIUM) 500 MG chewable tablet Chew 1-2 tablets by mouth daily.  . cefdinir (OMNICEF) 300 MG capsule Take 1 capsule (300 mg total) by mouth 2 (two) times daily.  . colestipol (COLESTID) 1 g tablet Take 1 tablet (1 g total) by mouth daily. (Patient taking differently: Take 1 g by mouth daily. As needed)  . Dupilumab (DUPIXENT) 300 MG/2ML SOPN Inject 300 mg into the skin every 14  (fourteen) days.  . fluocinonide cream (LIDEX) 8.56 % Apply 1 application topically 2 (two) times daily. (Patient taking differently: Apply 1 application topically daily as needed (eczema).)  . fluticasone (FLONASE) 50 MCG/ACT nasal spray Place 2 sprays into both nostrils daily.  . furosemide (LASIX) 20 MG tablet Take 1 tablet (20 mg total) by mouth daily.  . hydrocortisone (ANUSOL-HC) 2.5 % rectal cream Place 1 application rectally 2 (two) times daily. (Patient taking differently: Place 1 application rectally 2 (two) times daily as needed for hemorrhoids.)  . montelukast (SINGULAIR) 10 MG tablet Take 1 tablet (10 mg total) by mouth at bedtime.  . Multiple Vitamins-Calcium (ONE-A-DAY WOMENS PO) Take 1 tablet by mouth daily.   . Potassium 99 MG TABS Take 99 mg by mouth daily.  . Probiotic Product (DIGESTIVE ADVANTAGE PO) Take 1 capsule by mouth daily.  Marland Kitchen triamcinolone cream (KENALOG) 0.1 % Apply 1 application topically daily as needed (eczema).      Family History  Problem Relation Age of Onset  . Colon cancer Mother        age 20  . Kidney cancer Mother        cause of death, 66 cancer surgeries, died at age 33  . Diabetes Brother   . Hypertension Brother   . Cancer Brother     Social History   Socioeconomic History  . Marital status: Married    Spouse name: Not on file  . Number of children: 1  . Years of education: Not on file  . Highest education level: Not on file  Occupational History  . Occupation: bookeeping   Tobacco Use  . Smoking status: Former Research scientist (life sciences)  . Smokeless tobacco: Never Used  . Tobacco comment: quit 1990  Substance and Sexual Activity  . Alcohol use: Not Currently    Alcohol/week: 0.0 standard drinks    Comment: occasional, not daily  . Drug use: No  . Sexual activity: Not Currently    Birth control/protection: None  Other Topics Concern  . Not on file  Social History Narrative  . Not on file   Social Determinants of Health   Financial Resource  Strain: Not on file  Food Insecurity: Not on file  Transportation Needs: Not on file  Physical Activity: Not on file  Stress: Not on file  Social Connections: Not on file       Review of Systems: Gen: Denies fever, chills, anorexia. Denies fatigue, weakness, weight loss.  CV: Denies chest pain, palpitations, syncope, peripheral edema, and claudication. Resp: Denies dyspnea at rest, cough, wheezing, coughing up blood, and pleurisy. GI: see HPI Derm: Denies rash, itching, dry skin Psych: Denies depression, anxiety, memory loss, confusion. No homicidal or suicidal ideation.  Heme: Denies bruising, bleeding, and enlarged lymph nodes.  Observations/Objective: No distress. Unable to perform physical exam due to telephone encounter. No video available.   Assessment and Plan: Very pleasant 70 year old female with history of chronic diarrhea dating back to cholecystectomy in 2013, felt to have bile-salt diarrhea. Recently underwent colonoscopy and due for 5 year surveillance.  Overall, improved diarrhea on Colestid but trends towards constipation if taking it daily. I have asked her to trial this every other day. She is to call if this is not helpful., Otherwise, we will see her in 1 year.   Follow Up Instructions:    I discussed the assessment and treatment plan with the patient. The patient was provided an opportunity to ask questions and all were answered. The patient agreed with the plan and demonstrated an understanding of the instructions.   The patient was advised to call back or seek an in-person evaluation if the symptoms worsen or if the condition fails to improve as anticipated.  I provided 10 minutes of non face-to-face time during this telephone encounter.  Annitta Needs, PhD, ANP-BC [facility]

## 2020-11-12 ENCOUNTER — Telehealth (INDEPENDENT_AMBULATORY_CARE_PROVIDER_SITE_OTHER): Payer: Medicare HMO | Admitting: Gastroenterology

## 2020-11-12 ENCOUNTER — Telehealth: Payer: Self-pay | Admitting: *Deleted

## 2020-11-12 ENCOUNTER — Encounter: Payer: Self-pay | Admitting: Gastroenterology

## 2020-11-12 DIAGNOSIS — K9089 Other intestinal malabsorption: Secondary | ICD-10-CM | POA: Diagnosis not present

## 2020-11-12 NOTE — Patient Instructions (Signed)
You can try taking Colestid every other day. Let me know how this works for you!  We will see you back in 1 year or sooner if needed!  Please call with any concerns in the meantime!  Have a great birthday!  I enjoyed talking with you again today! As you know, I value our relationship and want to provide genuine, compassionate, and quality care. I welcome your feedback. If you receive a survey regarding your visit,  I greatly appreciate you taking time to fill this out. See you next time!  Annitta Needs, PhD, ANP-BC [facility]

## 2020-11-12 NOTE — Progress Notes (Signed)
Cc'ed to pcp °

## 2020-11-12 NOTE — Telephone Encounter (Signed)
Pt consented to a virtual visit. 

## 2020-11-12 NOTE — Telephone Encounter (Signed)
Julie Brown, you are scheduled for a virtual visit with your provider today.  Just as we do with appointments in the office, we must obtain your consent to participate.  Your consent will be active for this visit and any virtual visit you may have with one of our providers in the next 365 days.  If you have a MyChart account, I can also send a copy of this consent to you electronically.  All virtual visits are billed to your insurance company just like a traditional visit in the office.  As this is a virtual visit, video technology does not allow for your provider to perform a traditional examination.  This may limit your provider's ability to fully assess your condition.  If your provider identifies any concerns that need to be evaluated in person or the need to arrange testing such as labs, EKG, etc, we will make arrangements to do so.  Although advances in technology are sophisticated, we cannot ensure that it will always work on either your end or our end.  If the connection with a video visit is poor, we may have to switch to a telephone visit.  With either a video or telephone visit, we are not always able to ensure that we have a secure connection.   I need to obtain your verbal consent now.   Are you willing to proceed with your visit today?

## 2020-12-31 ENCOUNTER — Encounter: Payer: Self-pay | Admitting: Dermatology

## 2020-12-31 ENCOUNTER — Other Ambulatory Visit: Payer: Self-pay

## 2020-12-31 DIAGNOSIS — Z79899 Other long term (current) drug therapy: Secondary | ICD-10-CM

## 2020-12-31 DIAGNOSIS — Z13220 Encounter for screening for lipoid disorders: Secondary | ICD-10-CM

## 2020-12-31 MED ORDER — POTASSIUM CHLORIDE ER 10 MEQ PO TBCR: 10.0000 meq | EXTENDED_RELEASE_TABLET | Freq: Every day | ORAL | 0 refills | Status: DC

## 2020-12-31 MED ORDER — FUROSEMIDE 20 MG PO TABS: 20.0000 mg | ORAL_TABLET | Freq: Every day | ORAL | 0 refills | Status: DC

## 2021-01-01 ENCOUNTER — Other Ambulatory Visit: Payer: Self-pay

## 2021-01-01 DIAGNOSIS — Z13220 Encounter for screening for lipoid disorders: Secondary | ICD-10-CM | POA: Diagnosis not present

## 2021-01-01 DIAGNOSIS — Z79899 Other long term (current) drug therapy: Secondary | ICD-10-CM | POA: Diagnosis not present

## 2021-01-02 LAB — COMPREHENSIVE METABOLIC PANEL
ALT: 31 IU/L (ref 0–32)
AST: 24 IU/L (ref 0–40)
Albumin/Globulin Ratio: 2.1 (ref 1.2–2.2)
Albumin: 4.5 g/dL (ref 3.8–4.8)
Alkaline Phosphatase: 161 IU/L — ABNORMAL HIGH (ref 44–121)
BUN/Creatinine Ratio: 15 (ref 12–28)
BUN: 13 mg/dL (ref 8–27)
Bilirubin Total: 0.6 mg/dL (ref 0.0–1.2)
CO2: 27 mmol/L (ref 20–29)
Calcium: 9.9 mg/dL (ref 8.7–10.3)
Chloride: 103 mmol/L (ref 96–106)
Creatinine, Ser: 0.89 mg/dL (ref 0.57–1.00)
Globulin, Total: 2.1 g/dL (ref 1.5–4.5)
Glucose: 111 mg/dL — ABNORMAL HIGH (ref 65–99)
Potassium: 4.3 mmol/L (ref 3.5–5.2)
Sodium: 142 mmol/L (ref 134–144)
Total Protein: 6.6 g/dL (ref 6.0–8.5)
eGFR: 71 mL/min/{1.73_m2} (ref >59)

## 2021-01-02 LAB — LIPID PANEL
Chol/HDL Ratio: 4.4 ratio (ref 0.0–4.4)
Cholesterol, Total: 240 mg/dL — ABNORMAL HIGH (ref 100–199)
HDL: 55 mg/dL (ref >39)
LDL Chol Calc (NIH): 155 mg/dL — ABNORMAL HIGH (ref 0–99)
Triglycerides: 165 mg/dL — ABNORMAL HIGH (ref 0–149)
VLDL Cholesterol Cal: 30 mg/dL (ref 5–40)

## 2021-01-02 LAB — CBC WITH DIFFERENTIAL/PLATELET
Basophils Absolute: 0.1 10*3/uL (ref 0.0–0.2)
Basos: 1 %
EOS (ABSOLUTE): 0.1 10*3/uL (ref 0.0–0.4)
Eos: 2 %
Hematocrit: 45.1 % (ref 34.0–46.6)
Hemoglobin: 15.2 g/dL (ref 11.1–15.9)
Immature Grans (Abs): 0 10*3/uL (ref 0.0–0.1)
Immature Granulocytes: 0 %
Lymphocytes Absolute: 1.9 10*3/uL (ref 0.7–3.1)
Lymphs: 31 %
MCH: 31.1 pg (ref 26.6–33.0)
MCHC: 33.7 g/dL (ref 31.5–35.7)
MCV: 92 fL (ref 79–97)
Monocytes Absolute: 0.6 10*3/uL (ref 0.1–0.9)
Monocytes: 10 %
Neutrophils Absolute: 3.3 10*3/uL (ref 1.4–7.0)
Neutrophils: 56 %
Platelets: 278 10*3/uL (ref 150–450)
RBC: 4.88 x10E6/uL (ref 3.77–5.28)
RDW: 12.3 % (ref 11.7–15.4)
WBC: 6 10*3/uL (ref 3.4–10.8)

## 2021-01-07 ENCOUNTER — Other Ambulatory Visit: Payer: Self-pay | Admitting: *Deleted

## 2021-01-07 DIAGNOSIS — L2084 Intrinsic (allergic) eczema: Secondary | ICD-10-CM

## 2021-01-07 MED ORDER — DUPIXENT 300 MG/2ML ~~LOC~~ SOAJ: 300.0000 mg | SUBCUTANEOUS | 1 refills | Status: DC

## 2021-01-16 ENCOUNTER — Encounter: Payer: Self-pay | Admitting: Family Medicine

## 2021-01-16 ENCOUNTER — Other Ambulatory Visit: Payer: Self-pay

## 2021-01-16 ENCOUNTER — Ambulatory Visit (INDEPENDENT_AMBULATORY_CARE_PROVIDER_SITE_OTHER): Payer: Medicare HMO | Admitting: Family Medicine

## 2021-01-16 VITALS — BP 116/83 | HR 86 | Temp 97.9°F | Ht 64.0 in | Wt 223.6 lb

## 2021-01-16 DIAGNOSIS — E669 Obesity, unspecified: Secondary | ICD-10-CM | POA: Diagnosis not present

## 2021-01-16 DIAGNOSIS — R7301 Impaired fasting glucose: Secondary | ICD-10-CM | POA: Diagnosis not present

## 2021-01-16 DIAGNOSIS — R208 Other disturbances of skin sensation: Secondary | ICD-10-CM | POA: Diagnosis not present

## 2021-01-16 DIAGNOSIS — R609 Edema, unspecified: Secondary | ICD-10-CM

## 2021-01-16 DIAGNOSIS — E785 Hyperlipidemia, unspecified: Secondary | ICD-10-CM | POA: Insufficient documentation

## 2021-01-16 MED ORDER — FUROSEMIDE 20 MG PO TABS
20.0000 mg | ORAL_TABLET | Freq: Every day | ORAL | 1 refills | Status: DC
Start: 2021-01-16 — End: 2021-08-27

## 2021-01-16 MED ORDER — POTASSIUM CHLORIDE ER 10 MEQ PO TBCR
10.0000 meq | EXTENDED_RELEASE_TABLET | Freq: Every day | ORAL | 1 refills | Status: DC
Start: 2021-01-16 — End: 2021-08-27

## 2021-01-16 NOTE — Patient Instructions (Signed)
Preventing High Cholesterol Cholesterol is a white, waxy substance similar to fat that the human body needs to help build cells. The liver makes all the cholesterol that a person's body needs. Having high cholesterol (hypercholesterolemia) increases your risk for heart disease and stroke. Extra or excess cholesterolcomes from the food that you eat. High cholesterol can often be prevented with diet and lifestyle changes. If you already have high cholesterol, you can control it with diet, lifestyle changes,and medicines. How can high cholesterol affect me? If you have high cholesterol, fatty deposits (plaques) may build up on the walls of your blood vessels. The blood vessels that carry blood away from your heart are called arteries. Plaques make the arteries narrower and stiffer. This in turn can: Restrict or block blood flow and cause blood clots to form. Increase your risk for heart attack and stroke. What can increase my risk for high cholesterol? This condition is more likely to develop in people who: Eat foods that are high in saturated fat or cholesterol. Saturated fat is mostly found in foods that come from animal sources. Are overweight. Are not getting enough exercise. Have a family history of high cholesterol (familial hypercholesterolemia). What actions can I take to prevent this? Nutrition  Eat less saturated fat. Avoid trans fats (partially hydrogenated oils). These are often found in margarine and in some baked goods, fried foods, and snacks bought in packages. Avoid precooked or cured meat, such as bacon, sausages, or meat loaves. Avoid foods and drinks that have added sugars. Eat more fruits, vegetables, and whole grains. Choose healthy sources of protein, such as fish, poultry, lean cuts of red meat, beans, peas, lentils, and nuts. Choose healthy sources of fat, such as: Nuts. Vegetable oils, especially olive oil. Fish that have healthy fats, such as omega-3 fatty acids.  These fish include mackerel or salmon.  Lifestyle Lose weight if you are overweight. Maintaining a healthy body mass index (BMI) can help prevent or control high cholesterol. It can also lower your risk for diabetes and high blood pressure. Ask your health care provider to help you with a diet and exercise plan to lose weight safely. Do not use any products that contain nicotine or tobacco, such as cigarettes, e-cigarettes, and chewing tobacco. If you need help quitting, ask your health care provider. Alcohol use Do not drink alcohol if: Your health care provider tells you not to drink. You are pregnant, may be pregnant, or are planning to become pregnant. If you drink alcohol: Limit how much you use to: 0-1 drink a day for women. 0-2 drinks a day for men. Be aware of how much alcohol is in your drink. In the [place] one drink equals one 12 oz bottle of beer (355 mL), one 5 oz glass of wine (148 mL), or one 1 oz glass of hard liquor (44 mL). Activity  Get enough exercise. Do exercises as told by your health care provider. Each week, do at least 150 minutes of exercise that takes a medium level of effort (moderate-intensity exercise). This kind of exercise: Makes your heart beat faster while allowing you to still be able to talk. Can be done in short sessions several times a day or longer sessions a few times a week. For example, on 5 days each week, you could walk fast or ride your bike 3 times a day for 10 minutes each time.  Medicines Your health care provider may recommend medicines to help lower cholesterol. This may be a medicine to lower  the amount of cholesterol that your liver makes. You may need medicine if: Diet and lifestyle changes have not lowered your cholesterol enough. You have high cholesterol and other risk factors for heart disease or stroke. Take over-the-counter and prescription medicines only as told by your health care provider. General information Manage your risk  factors for high cholesterol. Talk with your health care provider about all your risk factors and how to lower your risk. Manage other conditions that you have, such as diabetes or high blood pressure (hypertension). Have blood tests to check your cholesterol levels at regular points in time as told by your health care provider. Keep all follow-up visits as told by your health care provider. This is important. Where to find more information American Heart Association: www.heart.org National Heart, Lung, and Blood Institute: https://wilson-eaton.com/ Summary High cholesterol increases your risk for heart disease and stroke. By keeping your cholesterol level low, you can reduce your risk for these conditions. High cholesterol can often be prevented with diet and lifestyle changes. Work with your health care provider to manage your risk factors, and have your blood tested regularly. This information is not intended to replace advice given to you by your health care provider. Make sure you discuss any questions you have with your healthcare provider. Document Revised: 04/04/2019 Document Reviewed: 04/04/2019 Elsevier Patient Education  [place].

## 2021-01-16 NOTE — Progress Notes (Signed)
Patient ID: Julie Brown, female    DOB: Jul 03, 1951, 70 y.o.   MRN: [id number]   Chief Complaint  Patient presents with   pedal edema    Follow up   Subjective:    HPI  Elevated cholesterol.  Glucose 111.  Has h/o elevated alk phos since 2020.  H/o fatty liver disease 2010. U/s in 2013- with hepatic steatosis. Had colonoscopy 2/22- and no polyp and return in 5 yrs. Mother with h/o colon cancer. Pt on colestipol to help with diarrhea after having cholecystectomy. Taking 1/2 tab daily to help.  Lower leg swelling doing okay.  Taking lasix and potassium daily.   Noticing lower legs with burning esp at night.  Using golo supplement and helping to lose weight 13 lbs  Increasing her veggies.  Not inc fruits.      Medical History Siya has a past medical history of Asthma, Hayfever, Hyperlipidemia, Migraine headache, and PONV (postoperative nausea and vomiting).   Outpatient Encounter Medications as of 01/16/2021  Medication Sig   albuterol (VENTOLIN HFA) 108 (90 Base) MCG/ACT inhaler Inhale 2 puffs into the lungs every 6 (six) hours as needed for wheezing or shortness of breath.   B Complex-Biotin-FA (SUPER B-COMPLEX PO) Take 1 tablet by mouth daily.    Biotin w/ Vitamins C & E (HAIR/SKIN/NAILS PO) Take 1 tablet by mouth daily.    calcium carbonate (TUMS - DOSED IN MG ELEMENTAL CALCIUM) 500 MG chewable tablet Chew 1-2 tablets by mouth daily.   colestipol (COLESTID) 1 g tablet Take 1 tablet (1 g total) by mouth daily. (Patient taking differently: Take 1 g by mouth daily. As needed)   Dupilumab (DUPIXENT) 300 MG/2ML SOPN Inject 300 mg into the skin every 14 (fourteen) days.   fluocinonide cream (LIDEX) 8.88 % Apply 1 application topically 2 (two) times daily. (Patient taking differently: Apply 1 application topically daily as needed (eczema).)   fluticasone (FLONASE) 50 MCG/ACT nasal spray Place 2 sprays into both nostrils daily.   furosemide (LASIX) 20 MG tablet Take 1  tablet (20 mg total) by mouth daily.   hydrocortisone (ANUSOL-HC) 2.5 % rectal cream Place 1 application rectally 2 (two) times daily. (Patient taking differently: Place 1 application rectally 2 (two) times daily as needed for hemorrhoids.)   montelukast (SINGULAIR) 10 MG tablet Take 1 tablet (10 mg total) by mouth at bedtime.   Multiple Vitamins-Calcium (ONE-A-DAY WOMENS PO) Take 1 tablet by mouth daily.    potassium chloride (KLOR-CON) 10 MEQ tablet Take 1 tablet (10 mEq total) by mouth daily.   Probiotic Product (DIGESTIVE ADVANTAGE PO) Take 1 capsule by mouth daily.   triamcinolone cream (KENALOG) 0.1 % Apply 1 application topically daily as needed (eczema).    [DISCONTINUED] cefdinir (OMNICEF) 300 MG capsule Take 1 capsule (300 mg total) by mouth 2 (two) times daily.   [DISCONTINUED] furosemide (LASIX) 20 MG tablet Take 1 tablet (20 mg total) by mouth daily.   [DISCONTINUED] Potassium 99 MG TABS Take 99 mg by mouth daily.   [DISCONTINUED] potassium chloride (KLOR-CON) 10 MEQ tablet Take 1 tablet (10 mEq total) by mouth daily.   No facility-administered encounter medications on file as of 01/16/2021.     Review of Systems  Constitutional:  Negative for chills and fever.  HENT:  Negative for congestion, rhinorrhea and sore throat.   Respiratory:  Negative for cough, shortness of breath and wheezing.   Cardiovascular:  Negative for chest pain and leg swelling.  Gastrointestinal:  Negative for abdominal  pain, diarrhea, nausea and vomiting.  Genitourinary:  Negative for dysuria and frequency.  Musculoskeletal:  Negative for arthralgias and back pain.       +lower legs tingling and burning at night.  Skin:  Negative for rash.  Neurological:  Negative for dizziness, weakness and headaches.    Vitals BP 116/83   Pulse 86   Temp 97.9 F (36.6 C) (Oral)   Ht _0  (1.626 m)   Wt 223 lb 9.6 oz (101.4 kg)   SpO2 98%   BMI 38.38 kg/m   Objective:   Physical Exam Vitals and nursing  note reviewed.  Constitutional:      Appearance: Normal appearance.  HENT:     Head: Normocephalic and atraumatic.  Eyes:     Extraocular Movements: Extraocular movements intact.     Conjunctiva/sclera: Conjunctivae normal.     Pupils: Pupils are equal, round, and reactive to light.  Cardiovascular:     Rate and Rhythm: Normal rate and regular rhythm.     Pulses: Normal pulses.     Heart sounds: Normal heart sounds.  Pulmonary:     Effort: Pulmonary effort is normal.     Breath sounds: Normal breath sounds. No wheezing, rhonchi or rales.  Musculoskeletal:        General: Normal range of motion.     Right lower leg: No edema.     Left lower leg: No edema.  Skin:    General: Skin is warm and dry.     Findings: No lesion or rash.  Neurological:     General: No focal deficit present.     Mental Status: She is alert and oriented to person, place, and time.     Cranial Nerves: No cranial nerve deficit.     Motor: No weakness.     Gait: Gait normal.  Psychiatric:        Mood and Affect: Mood normal.        Behavior: Behavior normal.        Thought Content: Thought content normal.        Judgment: Judgment normal.     Assessment and Plan   1. Impaired fasting blood sugar  2. Peripheral edema - furosemide (LASIX) 20 MG tablet; Take 1 tablet (20 mg total) by mouth daily.  Dispense: 90 tablet; Refill: 1 - potassium chloride (KLOR-CON) 10 MEQ tablet; Take 1 tablet (10 mEq total) by mouth daily.  Dispense: 90 tablet; Refill: 1  3. Hyperlipidemia LDL goal <130  4. Obesity (BMI 35.0-39.9 without comorbidity)  5. Burning sensation of lower extremity   HLD- 6.8% risk of ASCVD in next 10 yrs- borderline risk.  Cholesterol is increasing advising to make diet changes and increase in exercising.  Handout given for lowering cholesterol.  Inc in exercising and eat low fat/low carb diet. Pt in agreement.  Recheck cholesterol on next visit.  Lower leg edema improving.   Lower leg  tingling/burning at night- might have some neuropathy.  Recommending elevating legs and cont to control bg and if still bothering her to follow up in office.  Elevated fasting glucose- advising to eat low carb and inc exercising.  Pt in agreement.  Return in about 6 months (around 07/19/2021) for f/u hld, edema, glucose.

## 2021-02-05 DIAGNOSIS — H5203 Hypermetropia, bilateral: Secondary | ICD-10-CM | POA: Diagnosis not present

## 2021-02-05 DIAGNOSIS — H353131 Nonexudative age-related macular degeneration, bilateral, early dry stage: Secondary | ICD-10-CM | POA: Diagnosis not present

## 2021-02-05 DIAGNOSIS — H524 Presbyopia: Secondary | ICD-10-CM | POA: Diagnosis not present

## 2021-02-05 DIAGNOSIS — H52223 Regular astigmatism, bilateral: Secondary | ICD-10-CM | POA: Diagnosis not present

## 2021-02-05 DIAGNOSIS — H25813 Combined forms of age-related cataract, bilateral: Secondary | ICD-10-CM | POA: Diagnosis not present

## 2021-02-26 ENCOUNTER — Telehealth: Payer: Self-pay

## 2021-02-26 NOTE — Telephone Encounter (Signed)
Dupixent refilled with [facility] [phone] for 3 months no further fills until she is seen at her August visit for documentation for clearance on medication

## 2021-03-04 ENCOUNTER — Other Ambulatory Visit: Payer: Self-pay

## 2021-03-04 ENCOUNTER — Ambulatory Visit: Payer: Medicare HMO | Admitting: Dermatology

## 2021-03-04 DIAGNOSIS — L209 Atopic dermatitis, unspecified: Secondary | ICD-10-CM

## 2021-03-12 ENCOUNTER — Encounter: Payer: Self-pay | Admitting: Dermatology

## 2021-03-12 NOTE — Progress Notes (Signed)
   Follow-Up Visit   Subjective  Julie Brown is a 70 y.o. female who presents for the following: Annual Exam (Here for yearly follow up on Julie Brown. Patient doing well except she has a small spot on right eyebrow. ).  Adult eczema Location:  Duration:  Quality:  Associated Signs/Symptoms: Modifying Factors:  Severity:  Timing: Context:   Objective  Well appearing patient in no apparent distress; mood and affect are within normal limits. Head - Anterior (Face), Mid Back Active eczematous patches 95+ percent clear.  Encourage patient to maintain skin hydration but she does not feel that she needs any topical prescriptions.  Minimal injection site reactions.  Minimal cost. Patient on Dupixent and doing well.     A focused examination was performed including head, neck, arms, back.. Relevant physical exam findings are noted in the Assessment and Plan.   Assessment & Plan    Atopic dermatitis, unspecified type Head - Anterior (Face); Mid Back  Follow up 1 year.  Encouraged to contact me if there are any issues related to her skin or the medication before the      I, Lavonna Monarch, MD, have reviewed all documentation for this visit.  The documentation on 03/12/21 for the exam, diagnosis, procedures, and orders are all accurate and complete.

## 2021-03-15 ENCOUNTER — Telehealth: Payer: Self-pay

## 2021-03-15 NOTE — Telephone Encounter (Signed)
Left detailed message for patient to return the call to the [facility] office during regular business hours to set up her AWV. I left the office contact information on the vm.

## 2021-03-24 NOTE — Telephone Encounter (Signed)
Per Dr Denna Haggard ok to refill dupixent for 1 year [facility] aware

## 2021-04-04 ENCOUNTER — Encounter: Payer: Self-pay | Admitting: Nurse Practitioner

## 2021-04-04 ENCOUNTER — Ambulatory Visit (INDEPENDENT_AMBULATORY_CARE_PROVIDER_SITE_OTHER): Payer: Medicare HMO | Admitting: Nurse Practitioner

## 2021-04-04 ENCOUNTER — Other Ambulatory Visit: Payer: Self-pay

## 2021-04-04 VITALS — BP 141/82 | Temp 97.3°F | Wt 216.8 lb

## 2021-04-04 DIAGNOSIS — N904 Leukoplakia of vulva: Secondary | ICD-10-CM | POA: Diagnosis not present

## 2021-04-04 DIAGNOSIS — B3731 Acute candidiasis of vulva and vagina: Secondary | ICD-10-CM

## 2021-04-04 DIAGNOSIS — B373 Candidiasis of vulva and vagina: Secondary | ICD-10-CM

## 2021-04-04 MED ORDER — FLUOCINONIDE 0.05 % EX CREA
1.0000 | TOPICAL_CREAM | Freq: Two times a day (BID) | CUTANEOUS | 0 refills | Status: DC
Start: 2021-04-04 — End: 2021-04-04

## 2021-04-04 MED ORDER — FLUCONAZOLE 150 MG PO TABS
ORAL_TABLET | ORAL | 0 refills | Status: DC
Start: 2021-04-04 — End: 2021-04-23

## 2021-04-04 MED ORDER — FLUOCINONIDE 0.1 % EX CREA: TOPICAL_CREAM | CUTANEOUS | 0 refills | Status: DC

## 2021-04-04 NOTE — Progress Notes (Signed)
   Subjective:    Patient ID: Julie Brown, female    DOB: 04-14-1951, 70 y.o.   MRN: [id number]  HPI Pt having vaginal itching and burning. Pt states that this started Wednesday. Pt had steroid cream prescribed Dr.Eure about 3 years ago and that did help but expired. At that time used the cream for about 6 months then prn. Pt did use miconazole cream last night and that caused swelling and burning. Vaginal douche provided relief for about 1-2 hours. No sexual partners. No vaginal discharge. No pelvic pain or vaginal bleeding.         Objective:   Physical Exam NAD.  Alert, oriented.  External GU mild erythema with 2 small areas of hypopigmentation just outside of the introitus.  Speculum exam shows a small amount of clumpy white discharge, minimal irritation in the vaginal area. Today's Vitals   04/04/21 1021  BP: (!) 141/82  Temp: (!) 97.3 F (36.3 C)  Weight: 216 lb 12.8 oz (98.3 kg)   Body mass index is 37.21 kg/m.        Assessment & Plan:  Vaginal candidiasis  Lichen sclerosus et atrophicus of the vulva Meds ordered this encounter  Medications   fluconazole (DIFLUCAN) 150 MG tablet    Sig: One po qd prn yeast infection; may repeat in 3-4 days if needed    Dispense:  2 tablet    Refill:  0    Order Specific Question:   Supervising Provider    Answer:   Sallee Lange A [[id number]]   Fluocinonide (VANOS) 0.1 % CREA    Sig: Apply once daily to affected area prn up to 2 weeks at a time    Dispense:  30 g    Refill:  0    Order Specific Question:   Supervising Provider    Answer:   [place] would not cover her previous Lidex cream, switch to Vanos which is a stronger potency.  Advised patient to only use once a day for up to 2 weeks at a time. Start Diflucan as directed. Call back in 2 weeks if symptoms have not resolved, sooner if worse.

## 2021-04-17 DIAGNOSIS — H01002 Unspecified blepharitis right lower eyelid: Secondary | ICD-10-CM | POA: Diagnosis not present

## 2021-04-17 DIAGNOSIS — H353131 Nonexudative age-related macular degeneration, bilateral, early dry stage: Secondary | ICD-10-CM | POA: Diagnosis not present

## 2021-04-17 DIAGNOSIS — H01001 Unspecified blepharitis right upper eyelid: Secondary | ICD-10-CM | POA: Diagnosis not present

## 2021-04-17 DIAGNOSIS — H25813 Combined forms of age-related cataract, bilateral: Secondary | ICD-10-CM | POA: Diagnosis not present

## 2021-04-17 DIAGNOSIS — H01004 Unspecified blepharitis left upper eyelid: Secondary | ICD-10-CM | POA: Diagnosis not present

## 2021-04-23 ENCOUNTER — Other Ambulatory Visit: Payer: Self-pay

## 2021-04-23 ENCOUNTER — Ambulatory Visit: Payer: Medicare HMO | Admitting: Obstetrics & Gynecology

## 2021-04-23 ENCOUNTER — Encounter: Payer: Self-pay | Admitting: Obstetrics & Gynecology

## 2021-04-23 VITALS — BP 121/75 | HR 76 | Ht 64.0 in | Wt 214.2 lb

## 2021-04-23 DIAGNOSIS — L309 Dermatitis, unspecified: Secondary | ICD-10-CM | POA: Diagnosis not present

## 2021-04-23 DIAGNOSIS — N952 Postmenopausal atrophic vaginitis: Secondary | ICD-10-CM

## 2021-04-23 MED ORDER — PREMARIN 0.625 MG/GM VA CREA: 1.0000 | TOPICAL_CREAM | VAGINAL | 2 refills | Status: AC

## 2021-04-23 NOTE — Progress Notes (Signed)
   GYN VISIT Patient name: Julie Brown MRN [id number]  Date of birth: Dec 29, 1950 Chief Complaint:   vaginal irritation  History of Present Illness:   Julie Brown is a 70 y.o. G1P1001 PM female being seen today for the following concerns:  -Vaginitis: Seen by PCP- given Diflucan and low-dose steroid cream (using nightly x 1wk) then every other day/every couple of days.  Steroid cream helps on the outside, but not the inside.  Reports continued itching/burning.  Feels like when she urinates it burns the skin.  This has been going on for several months.  No vaginal discharge, no bleeding.  No pelvic pain. Not sexually active.  Describes symptoms as 3-4/10- mostly just "aware."  Prior to seeing PCP used monistat with worsening of her symptoms  No LMP recorded. Patient is postmenopausal.  Depression screen [facility] 2/9 01/16/2021 06/10/2020  Decreased Interest 0 0  Down, Depressed, Hopeless 0 0  PHQ - 2 Score 0 0     Review of Systems:   Pertinent items are noted in HPI Denies fever/chills, dizziness, headaches, visual disturbances, fatigue, shortness of breath, chest pain, abdominal pain, vomiting, bowel movements. Pertinent History Reviewed:  Reviewed past medical,surgical, social, obstetrical and family history.  Reviewed problem list, medications and allergies. Physical Assessment:   Vitals:   04/23/21 1111  BP: 121/75  Pulse: 76  Weight: 214 lb 3.2 oz (97.2 kg)  Height: 5\' 4"  (1.626 m)  Body mass index is 36.77 kg/m.       Physical Examination:   General appearance: alert, well appearing, and in no distress  Psych: mood appropriate, normal affect  Skin: warm & dry   Cardiovascular: normal heart rate noted  Respiratory: normal respiratory effort, no distress  Abdomen: soft, non-tender   Pelvic: well-demarcated pink patch noted on right perineum/rectal area, no discrete lesion, flattening of labia noted, vaginal mucosa- pink with appropriate ruggae, +tenderness noted on  bimanual exam- bilaterally ~ 2cm inferior to introitus, no discrete lesion or mass noted, normal cervix, no uterine or adnexal tenderness  Extremities: no edema   Chaperone: Celene Squibb    Assessment & Plan:  1) Vulvar dermatitis -reviewed clinical findings that support dermatitis -encouraged pt to continue with steroid cream- discussed long term use.  Also discussed potential to change to different steroid ointment  2) Vaginal discomfort -etiology unclear- no evidence of infection -concern that atrophic changes may be a component -discussed trial of vaginal estrogen cream- plan to use twice weekly HRT risk assessment: Denies h/o stroke, VTE or MI.  Denies h/o breast or uterine cancer   Return in about 3 months (around 07/24/2021) for 2-64mo video visit- with Dr. Nelda Marseille.   Janyth Pupa, DO Attending [place], [facility] for [employer], [place]

## 2021-05-26 ENCOUNTER — Other Ambulatory Visit: Payer: Self-pay

## 2021-05-26 ENCOUNTER — Encounter (HOSPITAL_COMMUNITY)
Admission: RE | Admit: 2021-05-26 | Discharge: 2021-05-26 | Disposition: A | Discharge status: Home / Self Care | Payer: Medicare HMO | Source: Ambulatory Visit | Attending: Ophthalmology | Admitting: Ophthalmology

## 2021-05-26 DIAGNOSIS — H25811 Combined forms of age-related cataract, right eye: Secondary | ICD-10-CM | POA: Diagnosis not present

## 2021-05-27 NOTE — H&P (Signed)
Surgical History & Physical  Patient Name: Julie Brown DOB: 1951/05/18  Surgery: Cataract extraction with intraocular lens implant phacoemulsification; Right Eye  Surgeon: Baruch Goldmann MD Surgery Date:  06-02-21 Pre-Op Date:  05-26-21  HPI: A 74 Yr. old female patient is referred by Dr Hassell Done for cataract eval. 1. The patient complains of difficulty when viewing TV, reading closed caption, news scrolls on TV, which began 1 year ago. Both eyes are affected. The episode is gradual. The condition's severity increased since last visit. Symptoms occur when the patient is inside and outside. This is negatively affecting the patient's quality of life. HPI was performed by Baruch Goldmann .  Medical History: Cataracts Ankle swelling  Review of Systems Negative Allergic/Immunologic Negative Cardiovascular Negative Constitutional Negative Ear, Nose, Mouth & Throat Negative Endocrine Negative Eyes Negative Gastrointestinal Negative Genitourinary Negative Hemotologic/Lymphatic Negative Integumentary Negative Musculoskeletal Negative Neurological Negative Psychiatry Negative Respiratory  Social   Former smoker   Medication Cetirizine, Colestipol, Dupixent, Fluocinonide, Furosemide, Potassium chloride, Triamcinolone Cream, Hydrocortisone cream,   Sx/Procedures Breast Lumpectomy, Foot Surgery, Wisdom Teeth Removal, Gallbladder Removal, Tubal Ligation,   Drug Allergies  Codeine,   History & Physical: Heent: Cataract, Right Eye NECK: supple without bruits LUNGS: lungs clear to auscultation CV: regular rate and rhythm Abdomen: soft and non-tender  Impression & Plan: Assessment: 1.  COMBINED FORMS AGE RELATED CATARACT; Both Eyes (H25.813) 2.  BLEPHARITIS; Right Upper Lid, Right Lower Lid, Left Upper Lid, Left Lower Lid (H01.001, H01.002,H01.004,H01.005) 3.  Age-related Macular Degeneration, DRY; Both Eyes Early (H35.3131) 4.  ASTIGMATISM, REGULAR; Both Eyes (H52.223)  Plan:  1.  Cataract accounts for the patient's decreased vision. This visual impairment is not correctable with a tolerable change in glasses or contact lenses. Cataract surgery with an implantation of a new lens should significantly improve the visual and functional status of the patient. Discussed all risks, benefits, alternatives, and potential complications. Discussed the procedures and recovery. Patient desires to have surgery. A-scan ordered and performed today for intra-ocular lens calculations. The surgery will be performed in order to improve vision for driving, reading, and for eye examinations. Recommend phacoemulsification with intra-ocular lens. Recommend Dextenza for post-operative pain and inflammation. Right Eye worse - first. Dilates well - shugarcaine by protocol. Toric Lens - Eyhance or Vivity.  2.  Recommend regular lid cleaning  3.  Mild Drusen. OCT macula today. Recommend amsler Amsler grid testing weekly. Call with any worsening vision, pain, or any other concerns.  4.  Recommend Toric IOL - Eyhance or Vivity.

## 2021-06-02 ENCOUNTER — Encounter (HOSPITAL_COMMUNITY): Payer: Self-pay | Admitting: Ophthalmology

## 2021-06-02 ENCOUNTER — Ambulatory Visit (HOSPITAL_COMMUNITY): Payer: Medicare HMO | Admitting: Certified Registered"

## 2021-06-02 ENCOUNTER — Encounter (HOSPITAL_COMMUNITY)
Admission: RE | Disposition: A | Discharge status: Home / Self Care | Payer: Self-pay | Source: Home / Self Care | Attending: Ophthalmology

## 2021-06-02 ENCOUNTER — Ambulatory Visit (HOSPITAL_COMMUNITY)
Admission: RE | Admit: 2021-06-02 | Discharge: 2021-06-02 | Disposition: A | Discharge status: Home / Self Care | Payer: Medicare HMO | Attending: Ophthalmology | Admitting: Ophthalmology

## 2021-06-02 DIAGNOSIS — H0100A Unspecified blepharitis right eye, upper and lower eyelids: Secondary | ICD-10-CM | POA: Insufficient documentation

## 2021-06-02 DIAGNOSIS — H353130 Nonexudative age-related macular degeneration, bilateral, stage unspecified: Secondary | ICD-10-CM | POA: Insufficient documentation

## 2021-06-02 DIAGNOSIS — J45909 Unspecified asthma, uncomplicated: Secondary | ICD-10-CM | POA: Diagnosis not present

## 2021-06-02 DIAGNOSIS — Z87891 Personal history of nicotine dependence: Secondary | ICD-10-CM | POA: Diagnosis not present

## 2021-06-02 DIAGNOSIS — H0100B Unspecified blepharitis left eye, upper and lower eyelids: Secondary | ICD-10-CM | POA: Insufficient documentation

## 2021-06-02 DIAGNOSIS — H52203 Unspecified astigmatism, bilateral: Secondary | ICD-10-CM | POA: Insufficient documentation

## 2021-06-02 DIAGNOSIS — E039 Hypothyroidism, unspecified: Secondary | ICD-10-CM | POA: Diagnosis not present

## 2021-06-02 DIAGNOSIS — H25811 Combined forms of age-related cataract, right eye: Secondary | ICD-10-CM | POA: Diagnosis not present

## 2021-06-02 HISTORY — PX: CATARACT EXTRACTION W/PHACO: SHX586

## 2021-06-02 SURGERY — PHACOEMULSIFICATION, CATARACT, WITH IOL INSERTION
Anesthesia: Monitor Anesthesia Care | Site: Eye | Laterality: Right

## 2021-06-02 MED ORDER — SODIUM CHLORIDE (PF) 0.9 % IJ SOLN
INTRAMUSCULAR | Status: AC
  Filled 2021-06-02: qty 10

## 2021-06-02 MED ORDER — TROPICAMIDE 1 % OP SOLN
1.0000 [drp] | OPHTHALMIC | Status: AC | PRN
  Administered 2021-06-02 (×3): 1 [drp] via OPHTHALMIC
  Filled 2021-06-02: qty 2

## 2021-06-02 MED ORDER — BSS IO SOLN
INTRAOCULAR | Status: DC | PRN
  Administered 2021-06-02: 15 mL via INTRAOCULAR

## 2021-06-02 MED ORDER — EPINEPHRINE PF 1 MG/ML IJ SOLN
INTRAMUSCULAR | Status: AC
  Filled 2021-06-02: qty 2

## 2021-06-02 MED ORDER — NEOMYCIN-POLYMYXIN-DEXAMETH 3.5-10000-0.1 OP SUSP
OPHTHALMIC | Status: DC | PRN
  Administered 2021-06-02: 2 [drp] via OPHTHALMIC

## 2021-06-02 MED ORDER — LIDOCAINE HCL (PF) 1 % IJ SOLN
INTRAOCULAR | Status: DC | PRN
  Administered 2021-06-02: 10:00:00 1 mL via OPHTHALMIC

## 2021-06-02 MED ORDER — MIDAZOLAM HCL 2 MG/2ML IJ SOLN
INTRAMUSCULAR | Status: DC | PRN
  Administered 2021-06-02: 2 mg via INTRAVENOUS

## 2021-06-02 MED ORDER — TETRACAINE HCL 0.5 % OP SOLN
1.0000 [drp] | OPHTHALMIC | Status: AC | PRN
  Administered 2021-06-02 (×3): 1 [drp] via OPHTHALMIC

## 2021-06-02 MED ORDER — SODIUM CHLORIDE 0.9% FLUSH
INTRAVENOUS | Status: DC | PRN
  Administered 2021-06-02: 5 mL via INTRAVENOUS

## 2021-06-02 MED ORDER — SODIUM HYALURONATE 23MG/ML IO SOSY
PREFILLED_SYRINGE | INTRAOCULAR | Status: DC | PRN
  Administered 2021-06-02: 0.6 mL via INTRAOCULAR

## 2021-06-02 MED ORDER — STERILE WATER FOR IRRIGATION IR SOLN
Status: DC | PRN
  Administered 2021-06-02: 250 mL

## 2021-06-02 MED ORDER — POVIDONE-IODINE 5 % OP SOLN
OPHTHALMIC | Status: DC | PRN
  Administered 2021-06-02: 1 via OPHTHALMIC

## 2021-06-02 MED ORDER — EPINEPHRINE PF 1 MG/ML IJ SOLN
INTRAOCULAR | Status: DC | PRN
  Administered 2021-06-02: 10:00:00 500 mL

## 2021-06-02 MED ORDER — PHENYLEPHRINE HCL 2.5 % OP SOLN
1.0000 [drp] | OPHTHALMIC | Status: AC | PRN
  Administered 2021-06-02 (×3): 1 [drp] via OPHTHALMIC

## 2021-06-02 MED ORDER — LIDOCAINE HCL 3.5 % OP GEL
1.0000 "application " | Freq: Once | OPHTHALMIC | Status: AC
  Administered 2021-06-02: 1 via OPHTHALMIC

## 2021-06-02 MED ORDER — MIDAZOLAM HCL 2 MG/2ML IJ SOLN
INTRAMUSCULAR | Status: AC
  Filled 2021-06-02: qty 2

## 2021-06-02 MED ORDER — SODIUM HYALURONATE 10 MG/ML IO SOLUTION
PREFILLED_SYRINGE | INTRAOCULAR | Status: DC | PRN
  Administered 2021-06-02: 0.85 mL via INTRAOCULAR

## 2021-06-02 SURGICAL SUPPLY — 12 items
AcrySof IQ Vivity IOL (Intraocular Lens) ×2 IMPLANT
CLOTH BEACON ORANGE TIMEOUT ST (SAFETY) ×2 IMPLANT
EYE SHIELD UNIVERSAL CLEAR (GAUZE/BANDAGES/DRESSINGS) ×2 IMPLANT
GLOVE SURG UNDER POLY LF SZ7 (GLOVE) ×4 IMPLANT
NEEDLE HYPO 18GX1.5 BLUNT FILL (NEEDLE) ×2 IMPLANT
PAD ARMBOARD 7.5X6 YLW CONV (MISCELLANEOUS) ×2 IMPLANT
PROC W SPEC LENS (INTRAOCULAR LENS)
PROCESS W SPEC LENS (INTRAOCULAR LENS) IMPLANT
SYR TB 1ML LL NO SAFETY (SYRINGE) ×2 IMPLANT
TAPE SURG TRANSPORE 1 IN (GAUZE/BANDAGES/DRESSINGS) ×1 IMPLANT
TAPE SURGICAL TRANSPORE 1 IN (GAUZE/BANDAGES/DRESSINGS) ×2
WATER STERILE IRR 250ML POUR (IV SOLUTION) ×2 IMPLANT

## 2021-06-02 NOTE — Transfer of Care (Signed)
Immediate Anesthesia Transfer of Care Note  Patient: Julie Brown  Procedure(s) Performed: CATARACT EXTRACTION PHACO AND INTRAOCULAR LENS PLACEMENT RIGHT EYE (Right: Eye)  Patient Location: Short Stay  Anesthesia Type:MAC  Level of Consciousness: awake, alert  and oriented  Airway & Oxygen Therapy: Patient Spontanous Breathing  Post-op Assessment: Report given to RN and Post -op Vital signs reviewed and stable  Post vital signs: Reviewed and stable  Last Vitals:  Vitals Value Taken Time  BP    Temp    Pulse    Resp    SpO2      Last Pain:  Vitals:   06/02/21 0914  TempSrc: Oral  PainSc: 0-No pain         Complications: No notable events documented.

## 2021-06-02 NOTE — Anesthesia Postprocedure Evaluation (Signed)
Anesthesia Post Note  Patient: Julie Brown  Procedure(s) Performed: CATARACT EXTRACTION PHACO AND INTRAOCULAR LENS PLACEMENT RIGHT EYE (Right: Eye)  Patient location during evaluation: Phase II Anesthesia Type: MAC Level of consciousness: awake Pain management: pain level controlled Vital Signs Assessment: post-procedure vital signs reviewed and stable Respiratory status: spontaneous breathing and respiratory function stable Cardiovascular status: blood pressure returned to baseline and stable Postop Assessment: no headache and no apparent nausea or vomiting Anesthetic complications: no Comments: Late entry   No notable events documented.   Last Vitals:  Vitals:   06/02/21 0914 06/02/21 1028  BP: 125/86 124/79  Pulse: 71 68  Resp: 14 18  Temp: 36.7 C 36.5 C  SpO2: 98% 98%    Last Pain:  Vitals:   06/02/21 1028  TempSrc: Oral  PainSc: 0-No pain                 Louann Sjogren

## 2021-06-02 NOTE — Interval H&P Note (Signed)
History and Physical Interval Note:  06/02/2021 9:57 AM  Julie Brown  has presented today for surgery, with the diagnosis of nuclear cataract right eye.  The various methods of treatment have been discussed with the patient and family. After consideration of risks, benefits and other options for treatment, the patient has consented to  Procedure(s) with comments: CATARACT EXTRACTION PHACO AND INTRAOCULAR LENS PLACEMENT (IOC) (Right) - right as a surgical intervention.  The patient's history has been reviewed, patient examined, no change in status, stable for surgery.  I have reviewed the patient's chart and labs.  Questions were answered to the patient's satisfaction.     Baruch Goldmann

## 2021-06-02 NOTE — Anesthesia Preprocedure Evaluation (Signed)
Anesthesia Evaluation  Patient identified by MRN, date of birth, ID band Patient awake    Reviewed: Allergy & Precautions, H&P , NPO status , Patient's Chart, lab work & pertinent test results, reviewed documented beta blocker date and time   History of Anesthesia Complications (+) PONV and history of anesthetic complications  Airway Mallampati: II  TM Distance: >3 FB Neck ROM: full    Dental no notable dental hx.    Pulmonary asthma , former smoker,    Pulmonary exam normal breath sounds clear to auscultation       Cardiovascular Exercise Tolerance: Good negative cardio ROS   Rhythm:regular Rate:Normal     Neuro/Psych  Headaches, negative psych ROS   GI/Hepatic negative GI ROS, Neg liver ROS,   Endo/Other  Hypothyroidism   Renal/GU negative Renal ROS  negative genitourinary   Musculoskeletal   Abdominal   Peds  Hematology negative hematology ROS (+)   Anesthesia Other Findings   Reproductive/Obstetrics negative OB ROS                             Anesthesia Physical Anesthesia Plan  ASA: 2  Anesthesia Plan: MAC   Post-op Pain Management:    Induction:   PONV Risk Score and Plan:   Airway Management Planned:   Additional Equipment:   Intra-op Plan:   Post-operative Plan:   Informed Consent: I have reviewed the patients History and Physical, chart, labs and discussed the procedure including the risks, benefits and alternatives for the proposed anesthesia with the patient or authorized representative who has indicated his/her understanding and acceptance.     Dental Advisory Given  Plan Discussed with: CRNA  Anesthesia Plan Comments:         Anesthesia Quick Evaluation

## 2021-06-02 NOTE — Op Note (Signed)
Date of procedure: 06/02/21  Pre-operative diagnosis:  Visually significant combined form age-related cataract, Right Eye (H25.811)  Post-operative diagnosis:  Visually significant combined form age-related cataract, Right Eye (H25.811)  Procedure: Removal of cataract via phacoemulsification and insertion of intra-ocular lens Alcon [id number] 22.5D into the capsular bag of the Right Eye  Attending surgeon: Gerda Diss. Maly Lemarr, MD, MA  Anesthesia: MAC, Topical Akten  Complications: None  Estimated Blood Loss: <20m (minimal)  Specimens: None  Implants: As above  Indications:  Visually significant age-related cataract, Right Eye  Procedure:  The patient was seen and identified in the pre-operative area. The operative eye was identified and dilated.  The operative eye was marked.  Topical anesthesia was administered to the operative eye.     The patient was then to the operative suite and placed in the supine position.  A timeout was performed confirming the patient, procedure to be performed, and all other relevant information.   The patient's face was prepped and draped in the usual fashion for intra-ocular surgery.  A lid speculum was placed into the operative eye and the surgical microscope moved into place and focused.  A superotemporal paracentesis was created using a 20 gauge paracentesis blade.  Shugarcaine was injected into the anterior chamber.  Viscoelastic was injected into the anterior chamber.  A temporal clear-corneal main wound incision was created using a 2.452mmicrokeratome.  A continuous curvilinear capsulorrhexis was initiated using an irrigating cystitome and completed using capsulorrhexis forceps.  Hydrodissection and hydrodeliniation were performed.  Viscoelastic was injected into the anterior chamber.  A phacoemulsification handpiece and a chopper as a second instrument were used to remove the nucleus and epinucleus. The irrigation/aspiration handpiece was used to remove any  remaining cortical material.   The capsular bag was reinflated with viscoelastic, checked, and found to be intact.  The intraocular lens was inserted into the capsular bag.  The irrigation/aspiration handpiece was used to remove any remaining viscoelastic.  The clear corneal wound and paracentesis wounds were then hydrated and checked with Weck-Cels to be watertight.  The lid-speculum was removed.  The drape was removed.  The patient's face was cleaned with a wet and dry 4x4.   Maxitrol was instilled in the eye. A clear shield was taped over the eye. The patient was taken to the post-operative care unit in good condition, having tolerated the procedure well.  Post-Op Instructions: [facility] and will receive all other orders and instructions.

## 2021-06-02 NOTE — Anesthesia Procedure Notes (Signed)
Procedure Name: MAC Date/Time: 06/02/2021 10:08 AM Performed by: Orlie Dakin, CRNA Pre-anesthesia Checklist: Patient identified, Emergency Drugs available, Suction available and Patient being monitored Patient Re-evaluated:Patient Re-evaluated prior to induction Oxygen Delivery Method: Nasal cannula Placement Confirmation: positive ETCO2

## 2021-06-02 NOTE — Discharge Instructions (Addendum)
Please discharge patient when stable, will follow up today with Dr. Wrzosek at the [facility] office immediately following discharge.  Leave shield in place until visit.  All paperwork with discharge instructions will be given at the office.   [facility] Address:  [address]  [place], [place] [address]  

## 2021-06-03 ENCOUNTER — Encounter (HOSPITAL_COMMUNITY): Payer: Self-pay | Admitting: Ophthalmology

## 2021-06-06 ENCOUNTER — Encounter (HOSPITAL_COMMUNITY): Payer: Self-pay

## 2021-06-06 ENCOUNTER — Other Ambulatory Visit: Payer: Self-pay

## 2021-06-06 ENCOUNTER — Encounter (HOSPITAL_COMMUNITY)
Admission: RE | Admit: 2021-06-06 | Discharge: 2021-06-06 | Disposition: A | Discharge status: Home / Self Care | Payer: Medicare HMO | Source: Ambulatory Visit | Attending: Ophthalmology | Admitting: Ophthalmology

## 2021-06-11 NOTE — H&P (Signed)
Surgical History & Physical  Patient Name: Julie Brown DOB: April 21, 1951  Surgery: Cataract extraction with intraocular lens implant phacoemulsification; Left Eye  Surgeon: Baruch Goldmann MD Surgery Date:  06-16-21 Pre-Op Date:  06-09-21  HPI: A 40 Yr. old female patient The patient is returning after cataract surgery. The right eye is affected. Status post cataract surgery, which began 1 week ago: Since the last visit, the affected area is doing well. The patient's vision is blurry. Pt states blurriness at near and distance. Pt states she received "the wrong lens." Patient is following medication instructions. Pt taking PO combo drops BID OD. PT denies any eye pain or increase in floaters/flashes of light. The patient complains of difficulty when viewing TV, reading closed caption, news scrolls on TV, which began 1 year ago. The left eye is affected. The episode is gradual. The condition's severity increased since last visit. Symptoms occur when the patient is inside and outside. This is negatively affecting the patient's quality of life and the patient is unable to function adequately in life with the current level of vision. HPI Completed by Dr. Baruch Goldmann  Medical History: Cataracts Ankle swelling  Review of Systems Negative Allergic/Immunologic Negative Cardiovascular Negative Constitutional Negative Ear, Nose, Mouth & Throat Negative Endocrine Negative Eyes Negative Gastrointestinal Negative Genitourinary Negative Hemotologic/Lymphatic Negative Integumentary Negative Musculoskeletal Negative Neurological Negative Psychiatry Negative Respiratory  Social   Former smoker   Medication Prednisolone-Moxifloxacin-Bromfenac, Cetirizine, Colestipol, Dupixent, Fluocinonide, Furosemide, Potassium chloride, Triamcinolone Cream, Hydrocortisone cream,   Sx/Procedures Phaco c IOL OD, Breast Lumpectomy, Foot Surgery, Wisdom Teeth Removal, Gallbladder Removal, Tubal Ligation,   Drug  Allergies  Codeine,   History & Physical: Heent: Cataract, Left Eye NECK: supple without bruits LUNGS: lungs clear to auscultation CV: regular rate and rhythm Abdomen: soft and non-tender  Impression & Plan: Assessment: 1.  CATARACT EXTRACTION STATUS; Right Eye (Z98.41) 2.  COMBINED FORMS AGE RELATED CATARACT; Both Eyes (H25.813) 3.  Presbyopia ; Both Eyes (H52.4) 4.  ASTIGMATISM, REGULAR; Both Eyes (H52.223) 5.  NUCLEAR SCLEROSIS AGE RELATED; Left Eye (H25.12)  Plan: 1.  1 week after cataract surgery. Doing well with improved vision and normal eye pressure. Call with any problems or concerns. Continue Pred-Moxi-Brom 2x/day for 3 more weeks.  2.  Cataract accounts for the patient's decreased vision. This visual impairment is not correctable with a tolerable change in glasses or contact lenses. Cataract surgery with an implantation of a new lens should significantly improve the visual and functional status of the patient. Discussed all risks, benefits, alternatives, and potential complications. Discussed the procedures and recovery. Patient desires to have surgery. A-scan ordered and performed today for intra-ocular lens calculations. The surgery will be performed in order to improve vision for driving, reading, and for eye examinations. Recommend phacoemulsification with intra-ocular lens. Recommend Dextenza for post-operative pain and inflammation. Left Eye. Surgery required to correct imbalance of vision. Dilates well - shugarcaine by protocol. Vivity Toric Lens.  3.   4.  Recommend Toric IOL - Vivity Toric.  5.

## 2021-06-12 DIAGNOSIS — H25812 Combined forms of age-related cataract, left eye: Secondary | ICD-10-CM | POA: Diagnosis not present

## 2021-06-16 ENCOUNTER — Ambulatory Visit (HOSPITAL_COMMUNITY): Payer: Medicare HMO | Admitting: Anesthesiology

## 2021-06-16 ENCOUNTER — Encounter (HOSPITAL_COMMUNITY): Payer: Self-pay | Admitting: Ophthalmology

## 2021-06-16 ENCOUNTER — Encounter (HOSPITAL_COMMUNITY)
Admission: RE | Disposition: A | Discharge status: Home / Self Care | Payer: Self-pay | Source: Home / Self Care | Attending: Ophthalmology

## 2021-06-16 ENCOUNTER — Ambulatory Visit (HOSPITAL_COMMUNITY)
Admission: RE | Admit: 2021-06-16 | Discharge: 2021-06-16 | Disposition: A | Discharge status: Home / Self Care | Payer: Medicare HMO | Attending: Ophthalmology | Admitting: Ophthalmology

## 2021-06-16 DIAGNOSIS — H52223 Regular astigmatism, bilateral: Secondary | ICD-10-CM | POA: Diagnosis not present

## 2021-06-16 DIAGNOSIS — Z9841 Cataract extraction status, right eye: Secondary | ICD-10-CM | POA: Diagnosis not present

## 2021-06-16 DIAGNOSIS — H2512 Age-related nuclear cataract, left eye: Secondary | ICD-10-CM | POA: Diagnosis not present

## 2021-06-16 DIAGNOSIS — R519 Headache, unspecified: Secondary | ICD-10-CM | POA: Diagnosis not present

## 2021-06-16 DIAGNOSIS — K760 Fatty (change of) liver, not elsewhere classified: Secondary | ICD-10-CM | POA: Diagnosis not present

## 2021-06-16 DIAGNOSIS — H25812 Combined forms of age-related cataract, left eye: Secondary | ICD-10-CM | POA: Diagnosis not present

## 2021-06-16 DIAGNOSIS — Z9189 Other specified personal risk factors, not elsewhere classified: Secondary | ICD-10-CM | POA: Insufficient documentation

## 2021-06-16 DIAGNOSIS — E039 Hypothyroidism, unspecified: Secondary | ICD-10-CM | POA: Diagnosis not present

## 2021-06-16 DIAGNOSIS — Z87891 Personal history of nicotine dependence: Secondary | ICD-10-CM | POA: Diagnosis not present

## 2021-06-16 DIAGNOSIS — H524 Presbyopia: Secondary | ICD-10-CM | POA: Diagnosis not present

## 2021-06-16 HISTORY — PX: CATARACT EXTRACTION W/PHACO: SHX586

## 2021-06-16 SURGERY — PHACOEMULSIFICATION, CATARACT, WITH IOL INSERTION
Anesthesia: General | Site: Eye | Laterality: Left

## 2021-06-16 MED ORDER — SODIUM HYALURONATE 10 MG/ML IO SOLUTION
PREFILLED_SYRINGE | INTRAOCULAR | Status: DC | PRN
  Administered 2021-06-16: 0.85 mL via INTRAOCULAR

## 2021-06-16 MED ORDER — STERILE WATER FOR IRRIGATION IR SOLN
Status: DC | PRN
  Administered 2021-06-16: 250 mL

## 2021-06-16 MED ORDER — TETRACAINE HCL 0.5 % OP SOLN
1.0000 [drp] | OPHTHALMIC | Status: AC | PRN
  Administered 2021-06-16 (×3): 1 [drp] via OPHTHALMIC

## 2021-06-16 MED ORDER — EPINEPHRINE PF 1 MG/ML IJ SOLN
INTRAOCULAR | Status: DC | PRN
  Administered 2021-06-16: 500 mL

## 2021-06-16 MED ORDER — POVIDONE-IODINE 5 % OP SOLN
OPHTHALMIC | Status: DC | PRN
  Administered 2021-06-16: 1 via OPHTHALMIC

## 2021-06-16 MED ORDER — EPINEPHRINE PF 1 MG/ML IJ SOLN
INTRAMUSCULAR | Status: AC
  Filled 2021-06-16: qty 1

## 2021-06-16 MED ORDER — MIDAZOLAM HCL 2 MG/2ML IJ SOLN
INTRAMUSCULAR | Status: DC | PRN
  Administered 2021-06-16: 2 mg via INTRAVENOUS

## 2021-06-16 MED ORDER — TROPICAMIDE 1 % OP SOLN
1.0000 [drp] | OPHTHALMIC | Status: AC | PRN
  Administered 2021-06-16 (×3): 1 [drp] via OPHTHALMIC
  Filled 2021-06-16: qty 2

## 2021-06-16 MED ORDER — MIDAZOLAM HCL 2 MG/2ML IJ SOLN
INTRAMUSCULAR | Status: AC
  Filled 2021-06-16: qty 2

## 2021-06-16 MED ORDER — NEOMYCIN-POLYMYXIN-DEXAMETH 3.5-10000-0.1 OP SUSP
OPHTHALMIC | Status: DC | PRN
  Administered 2021-06-16: 1 [drp] via OPHTHALMIC

## 2021-06-16 MED ORDER — SODIUM HYALURONATE 23MG/ML IO SOSY
PREFILLED_SYRINGE | INTRAOCULAR | Status: DC | PRN
  Administered 2021-06-16: 0.6 mL via INTRAOCULAR

## 2021-06-16 MED ORDER — BSS IO SOLN
INTRAOCULAR | Status: DC | PRN
  Administered 2021-06-16: 15 mL via INTRAOCULAR

## 2021-06-16 MED ORDER — LIDOCAINE HCL (PF) 1 % IJ SOLN
INTRAOCULAR | Status: DC | PRN
  Administered 2021-06-16: 1 mL

## 2021-06-16 MED ORDER — PHENYLEPHRINE HCL 2.5 % OP SOLN
1.0000 [drp] | OPHTHALMIC | Status: AC | PRN
  Administered 2021-06-16 (×3): 1 [drp] via OPHTHALMIC

## 2021-06-16 MED ORDER — LIDOCAINE HCL 3.5 % OP GEL
1.0000 "application " | Freq: Once | OPHTHALMIC | Status: AC
  Administered 2021-06-16: 1 via OPHTHALMIC

## 2021-06-16 MED ORDER — SODIUM CHLORIDE 0.9% FLUSH
INTRAVENOUS | Status: DC | PRN
  Administered 2021-06-16: 3 mL via INTRAVENOUS

## 2021-06-16 SURGICAL SUPPLY — 16 items
ACRYSOF IQ VIVITY UV IOL (Intraocular Lens) ×1 IMPLANT
CATARACT SUITE SIGHTPATH (MISCELLANEOUS) ×1 IMPLANT
CLOTH BEACON ORANGE TIMEOUT ST (SAFETY) ×1 IMPLANT
EYE SHIELD UNIVERSAL CLEAR (GAUZE/BANDAGES/DRESSINGS) ×1 IMPLANT
GLOVE SURG UNDER POLY LF SZ6.5 (GLOVE) ×1 IMPLANT
GLOVE SURG UNDER POLY LF SZ7 (GLOVE) ×1 IMPLANT
Monarch 3 D IOL Delivery System ×1 IMPLANT
NDL HYPO 18GX1.5 BLUNT FILL (NEEDLE) IMPLANT
NEEDLE HYPO 18GX1.5 BLUNT FILL (NEEDLE) ×2 IMPLANT
PAD ARMBOARD 7.5X6 YLW CONV (MISCELLANEOUS) ×1 IMPLANT
PROC W SPEC LENS (INTRAOCULAR LENS)
PROCESS W SPEC LENS (INTRAOCULAR LENS) IMPLANT
SYR TB 1ML LL NO SAFETY (SYRINGE) ×1 IMPLANT
TAPE SURG TRANSPORE 1 IN (GAUZE/BANDAGES/DRESSINGS) IMPLANT
TAPE SURGICAL TRANSPORE 1 IN (GAUZE/BANDAGES/DRESSINGS) ×2
WATER STERILE IRR 250ML POUR (IV SOLUTION) ×1 IMPLANT

## 2021-06-16 NOTE — Anesthesia Postprocedure Evaluation (Signed)
Anesthesia Post Note  Patient: Julie Brown  Procedure(s) Performed: CATARACT EXTRACTION LEFT EYE W/ PHACO AND INTRAOCULAR LENS PLACEMENT (IOC) (Left: Eye)  Patient location during evaluation: Phase II Anesthesia Type: General Level of consciousness: awake and alert and oriented Pain management: pain level controlled Vital Signs Assessment: post-procedure vital signs reviewed and stable Respiratory status: spontaneous breathing, nonlabored ventilation and respiratory function stable Cardiovascular status: stable and blood pressure returned to baseline Postop Assessment: no apparent nausea or vomiting Anesthetic complications: no   No notable events documented.   Last Vitals:  Vitals:   06/16/21 0947 06/16/21 1106  BP: 138/78 125/72  Pulse:  74  Resp: 20 16  Temp: 36.6 C 36.5 C  SpO2: 100% 99%    Last Pain:  Vitals:   06/16/21 1106  TempSrc: Oral  PainSc: 3                  Cass Vandermeulen C Banner Huckaba

## 2021-06-16 NOTE — Interval H&P Note (Signed)
History and Physical Interval Note:  06/16/2021 10:38 AM  Julie Brown  has presented today for surgery, with the diagnosis of nuclear cataract left eye.  The various methods of treatment have been discussed with the patient and family. After consideration of risks, benefits and other options for treatment, the patient has consented to  Procedure(s) with comments: CATARACT EXTRACTION PHACO AND INTRAOCULAR LENS PLACEMENT ([place]) (Left) - left as a surgical intervention.  The patient's history has been reviewed, patient examined, no change in status, stable for surgery.  I have reviewed the patient's chart and labs.  Questions were answered to the patient's satisfaction.     Baruch Goldmann

## 2021-06-16 NOTE — Anesthesia Preprocedure Evaluation (Addendum)
Anesthesia Evaluation  Patient identified by MRN, date of birth, ID band Patient awake    Reviewed: Allergy & Precautions, H&P , NPO status , Patient's Chart, lab work & pertinent test results  History of Anesthesia Complications (+) PONV and history of anesthetic complications  Airway Mallampati: II  TM Distance: >3 FB Neck ROM: Full    Dental  (+) Dental Advisory Given [place] :   Pulmonary neg pulmonary ROS, former smoker,    Pulmonary exam normal breath sounds clear to auscultation       Cardiovascular negative cardio ROS Normal cardiovascular exam Rhythm:Regular Rate:Normal     Neuro/Psych  Headaches, negative psych ROS   GI/Hepatic negative GI ROS, Neg liver ROS,   Endo/Other  Hypothyroidism   Renal/GU negative Renal ROS  negative genitourinary   Musculoskeletal negative musculoskeletal ROS (+)   Abdominal   Peds negative pediatric ROS (+)  Hematology negative hematology ROS (+)   Anesthesia Other Findings   Reproductive/Obstetrics negative OB ROS                             Anesthesia Physical Anesthesia Plan  ASA: 2  Anesthesia Plan: MAC   Post-op Pain Management: Minimal or no pain anticipated   Induction:   PONV Risk Score and Plan:   Airway Management Planned: Nasal Cannula and Natural Airway  Additional Equipment:   Intra-op Plan:   Post-operative Plan:   Informed Consent: I have reviewed the patients History and Physical, chart, labs and discussed the procedure including the risks, benefits and alternatives for the proposed anesthesia with the patient or authorized representative who has indicated his/her understanding and acceptance.     Dental advisory given  Plan Discussed with: CRNA and Surgeon  Anesthesia Plan Comments:        Anesthesia Quick Evaluation

## 2021-06-16 NOTE — Anesthesia Procedure Notes (Signed)
Date/Time: 06/16/2021 10:42 AM Performed by: Vista Deck, CRNA Pre-anesthesia Checklist: Patient identified, Emergency Drugs available, Suction available, Timeout performed and Patient being monitored Patient Re-evaluated:Patient Re-evaluated prior to induction Oxygen Delivery Method: Nasal Cannula

## 2021-06-16 NOTE — Discharge Instructions (Signed)
Please discharge patient when stable, will follow up today with Dr. Chandon Lazcano at the [facility] office immediately following discharge.  Leave shield in place until visit.  All paperwork with discharge instructions will be given at the office.  [facility] Address:  [address]  [place], [place] [address]  

## 2021-06-16 NOTE — Transfer of Care (Signed)
Immediate Anesthesia Transfer of Care Note  Patient: Julie Brown  Procedure(s) Performed: CATARACT EXTRACTION LEFT EYE W/ PHACO AND INTRAOCULAR LENS PLACEMENT (IOC) (Left: Eye)  Patient Location: Short Stay  Anesthesia Type:MAC  Level of Consciousness: awake and patient cooperative  Airway & Oxygen Therapy: Patient Spontanous Breathing  Post-op Assessment: Report given to RN and Post -op Vital signs reviewed and stable  Post vital signs: Reviewed and stable  Last Vitals:  Vitals Value Taken Time  BP    Temp    Pulse    Resp    SpO2      Last Pain:  Vitals:   06/16/21 0947  TempSrc: Oral  PainSc: 0-No pain      Patients Stated Pain Goal: 8 (84/66/59 [id number])  Complications: No notable events documented.

## 2021-06-16 NOTE — Op Note (Signed)
Date of procedure: 06/16/21  Pre-operative diagnosis: Visually significant age-related cataract, Left Eye; Visually Significant Astigmatism, Left Eye (H25.812)  Post-operative diagnosis: Visually significant age-related cataract, Left Eye; Visually Significant Astigmatism, Left Eye  Procedure: Removal of cataract via phacoemulsification and insertion of intra-ocular lens Alcon Vivty [id number] +22.0D into the capsular bag of the Left Eye  Attending surgeon: Gerda Diss. Florentina Marquart, MD, MA  Anesthesia: MAC, Topical Akten  Complications: None  Estimated Blood Loss: <7m (minimal)  Specimens: None  Implants: As above  Indications:  Visually significant age-related cataract, Left Eye; Visually Significant Astigmatism, Left Eye  Procedure:  The patient was seen and identified in the pre-operative area. The operative eye was identified and dilated.  The operative eye was marked.  Pre-operative toric markers were used to mark the eye at 0 and 180 degrees. Topical anesthesia was administered to the operative eye.     The patient was then to the operative suite and placed in the supine position.  A timeout was performed confirming the patient, procedure to be performed, and all other relevant information.   The patient's face was prepped and draped in the usual fashion for intra-ocular surgery.  A lid speculum was placed into the operative eye and the surgical microscope moved into place and focused.  A superotemporal paracentesis was created using a 20 gauge paracentesis blade.  Shugarcaine was injected into the anterior chamber.  Viscoelastic was injected into the anterior chamber.  A temporal clear-corneal main wound incision was created using a 2.442mmicrokeratome.  A continuous curvilinear capsulorrhexis was initiated using an irrigating cystitome and completed using capsulorrhexis forceps.  Hydrodissection and hydrodeliniation were performed.  Viscoelastic was injected into the anterior chamber.  A  phacoemulsification handpiece and a chopper as a second instrument were used to remove the nucleus and epinucleus. The irrigation/aspiration handpiece was used to remove any remaining cortical material.   The capsular bag was reinflated with viscoelastic, checked, and found to be intact.  The eye was marked to the per-op meridian.  The intraocular lens was inserted into the capsular bag and dialed into place using a Kuglen hook to 63 degrees.  The irrigation/aspiration handpiece was used to remove any remaining viscoelastic.  The clear corneal wound and paracentesis wounds were then hydrated and checked with Weck-Cels to be watertight.  The lid-speculum and drape was removed, and the patient's face was cleaned with a wet and dry 4x4.  Maxitrol was instilled in the eye before a clear shield was taped over the eye. The patient was taken to the post-operative care unit in good condition, having tolerated the procedure well.  Post-Op Instructions: [facility] and will receive all other orders and instructions.

## 2021-06-20 ENCOUNTER — Encounter (HOSPITAL_COMMUNITY): Payer: Self-pay | Admitting: Ophthalmology

## 2021-06-22 ENCOUNTER — Encounter: Payer: Self-pay | Admitting: Nurse Practitioner

## 2021-06-23 ENCOUNTER — Ambulatory Visit (INDEPENDENT_AMBULATORY_CARE_PROVIDER_SITE_OTHER): Payer: Medicare HMO | Admitting: Family Medicine

## 2021-06-23 ENCOUNTER — Other Ambulatory Visit: Payer: Self-pay

## 2021-06-23 VITALS — BP 149/89 | HR 70 | Temp 98.1°F | Ht 64.0 in | Wt 215.0 lb

## 2021-06-23 DIAGNOSIS — J0111 Acute recurrent frontal sinusitis: Secondary | ICD-10-CM | POA: Insufficient documentation

## 2021-06-23 MED ORDER — AMOXICILLIN-POT CLAVULANATE 875-125 MG PO TABS: 1.0000 | ORAL_TABLET | Freq: Two times a day (BID) | ORAL | 0 refills | Status: DC

## 2021-06-23 NOTE — Progress Notes (Signed)
Subjective:  Patient ID: Julie Brown, female    DOB: [birth year]  Age: 70 y.o. MRN: [id number]  CC: Chief Complaint  Patient presents with   Sinusitis    Congestion, drainage, headaches x 2 days , no testing , taking dayquil     HPI:  70 year old female presents for evaluation of the above.  Patient states that she has been sick since Saturday.  She reports sinus pressure and congestion.  Most notably around in the frontal region.  No reported sick contacts.  She has not tested for COVID.  She is up-to-date on her immunizations.  Has been taking DayQuil without relief.  No fever.  No reports of body aches.  No other associated symptoms.  No other complaints.  Patient Active Problem List   Diagnosis Date Noted   Acute recurrent frontal sinusitis 09/73/5329   Lichen sclerosus et atrophicus of the vulva 04/04/2021   Obesity (BMI 35.0-39.9 without comorbidity) 01/16/2021   Hyperlipidemia LDL goal <130 01/16/2021   Seasonal allergic rhinitis due to pollen 10/22/2020   Subclinical hypothyroidism 06/17/2020   History of colonic polyps    Chronic diarrhea    FH: colon cancer in relative <24 years old 12/03/2015   FATTY LIVER DISEASE 02/12/2009    Social Hx   Social History   Socioeconomic History   Marital status: Married    Spouse name: Not on file   Number of children: 1   Years of education: Not on file   Highest education level: Not on file  Occupational History   Occupation: bookeeping   Tobacco Use   Smoking status: Former   Smokeless tobacco: Never   Tobacco comments:    quit 1990  Substance and Sexual Activity   Alcohol use: Not Currently    Alcohol/week: 0.0 standard drinks    Comment: occasional, not daily   Drug use: No   Sexual activity: Not Currently    Birth control/protection: None  Other Topics Concern   Not on file  Social History Narrative   Not on file   Social Determinants of Health   Financial Resource Strain: Not on file  Food  Insecurity: Not on file  Transportation Needs: Not on file  Physical Activity: Not on file  Stress: Not on file  Social Connections: Not on file    Review of Systems Per HPI  Objective:  BP (!) 149/89    Pulse 70    Temp 98.1 F (36.7 C)    Ht 5\' 4"  (1.626 m)    Wt 215 lb (97.5 kg)    SpO2 97%    BMI 36.90 kg/m   BP/Weight 06/23/2021 06/16/2021 92/10/2681  Systolic BP 419 622 -  Diastolic BP 89 72 -  Wt. (Lbs) 215 - 213  BMI 36.9 - 36.56    Physical Exam Constitutional:      General: She is not in acute distress.    Appearance: Normal appearance. She is obese. She is not ill-appearing.  HENT:     Head: Normocephalic and atraumatic.     Comments: Frontal sinus tenderness to palpation.    Right Ear: Tympanic membrane normal.     Left Ear: Tympanic membrane normal.  Eyes:     General:        Right eye: No discharge.        Left eye: No discharge.     Conjunctiva/sclera: Conjunctivae normal.  Cardiovascular:     Rate and Rhythm: Normal rate and regular rhythm.  Pulmonary:  Effort: Pulmonary effort is normal.     Breath sounds: Normal breath sounds. No wheezing, rhonchi or rales.  Neurological:     Mental Status: She is alert.  Psychiatric:        Mood and Affect: Mood normal.        Behavior: Behavior normal.    Lab Results  Component Value Date   WBC 6.0 01/01/2021   HGB 15.2 01/01/2021   HCT 45.1 01/01/2021   PLT 278 01/01/2021   GLUCOSE 111 (H) 01/01/2021   CHOL 240 (H) 01/01/2021   TRIG 165 (H) 01/01/2021   HDL 55 01/01/2021   LDLCALC 155 (H) 01/01/2021   ALT 31 01/01/2021   AST 24 01/01/2021   NA 142 01/01/2021   K 4.3 01/01/2021   CL 103 01/01/2021   CREATININE 0.89 01/01/2021   BUN 13 01/01/2021   CO2 27 01/01/2021   TSH 7.120 (H) 06/10/2020   HGBA1C 5.2 11/08/2017     Assessment & Plan:   Problem List Items Addressed This Visit       Respiratory   Acute recurrent frontal sinusitis - Primary    Immunosuppressed due to [place].  This  is a recurrent issue.  Placing on Augmentin.      Relevant Medications   amoxicillin-clavulanate (AUGMENTIN) 875-125 MG tablet    Meds ordered this encounter  Medications   amoxicillin-clavulanate (AUGMENTIN) 875-125 MG tablet    Sig: Take 1 tablet by mouth 2 (two) times daily.    Dispense:  20 tablet    Refill:  0    Follow-up:  PRN  [place]

## 2021-06-23 NOTE — Assessment & Plan Note (Signed)
Immunosuppressed due to [place].  This is a recurrent issue.  Placing on Augmentin.

## 2021-06-28 ENCOUNTER — Encounter: Payer: Self-pay | Admitting: Family Medicine

## 2021-06-30 ENCOUNTER — Encounter: Payer: Self-pay | Admitting: Family Medicine

## 2021-07-01 MED ORDER — CEFDINIR 300 MG PO CAPS: ORAL_CAPSULE | ORAL | 0 refills | Status: DC

## 2021-07-01 NOTE — Telephone Encounter (Signed)
Nurses recommend Omnicef 300 mg 1 twice daily for 7 days Recommend no work the rest of this week may return to work on Monday She should wear a mask for a total of 10 days from the day that she tested positive If progressive troubles or worse I recommend a follow-up office visit

## 2021-07-01 NOTE — Addendum Note (Signed)
Addended by: Vicente Males on: 07/01/2021 01:42 PM   Modules accepted: Orders

## 2021-07-01 NOTE — Telephone Encounter (Signed)
So certainly COVID could be playing a role  Given that the symptoms have been going on for this many days Paxlovid would not be of any help  Need to make sure she is not experiencing shortness of breath, difficulty breathing

## 2021-07-01 NOTE — Telephone Encounter (Signed)
See other message

## 2021-07-02 ENCOUNTER — Telehealth: Payer: Self-pay | Admitting: Family Medicine

## 2021-07-02 MED ORDER — CEFPROZIL 500 MG PO TABS: 500.0000 mg | ORAL_TABLET | Freq: Two times a day (BID) | ORAL | 0 refills | Status: DC

## 2021-07-02 NOTE — Telephone Encounter (Signed)
My next choice will be Cefzil 500 mg 1 twice daily 7 days if that is not available let me know

## 2021-07-02 NOTE — Telephone Encounter (Signed)
Fax from [facility] states that Cefdinir is not currently available. Please send RX for alternative. Please advise. Thank you.

## 2021-07-02 NOTE — Addendum Note (Signed)
Addended by: Dairl Ponder on: 07/02/2021 01:53 PM   Modules accepted: Orders

## 2021-07-02 NOTE — Telephone Encounter (Signed)
Prescription sent electronically to pharmacy. 

## 2021-07-10 ENCOUNTER — Other Ambulatory Visit: Payer: Self-pay | Admitting: Nurse Practitioner

## 2021-07-10 ENCOUNTER — Telehealth: Payer: Self-pay | Admitting: Family Medicine

## 2021-07-10 MED ORDER — FLUCONAZOLE 150 MG PO TABS: ORAL_TABLET | ORAL | 0 refills | Status: DC

## 2021-07-10 NOTE — Telephone Encounter (Signed)
Pt called and stated has yeast inf from antibiotics use that Dr Nicki Reaper provided last week. Req rx for that to [facility]. [phone]

## 2021-07-10 NOTE — Telephone Encounter (Signed)
Patient states she is having itching, swelling and burning in the vaginal area for 3 days. She states that she has been eating yogurt and applying a cream prescribed by gynecologist but not helping. Advised pt will send in Diflucan to pharmacy. Pt verbalized understanding

## 2021-07-23 DIAGNOSIS — M1712 Unilateral primary osteoarthritis, left knee: Secondary | ICD-10-CM | POA: Diagnosis not present

## 2021-07-23 DIAGNOSIS — M170 Bilateral primary osteoarthritis of knee: Secondary | ICD-10-CM | POA: Diagnosis not present

## 2021-07-23 DIAGNOSIS — M1711 Unilateral primary osteoarthritis, right knee: Secondary | ICD-10-CM | POA: Diagnosis not present

## 2021-07-29 ENCOUNTER — Encounter: Payer: Self-pay | Admitting: Obstetrics & Gynecology

## 2021-07-29 ENCOUNTER — Telehealth: Payer: Medicare HMO | Admitting: Obstetrics & Gynecology

## 2021-07-29 VITALS — Ht 64.0 in | Wt 203.0 lb

## 2021-07-29 DIAGNOSIS — N905 Atrophy of vulva: Secondary | ICD-10-CM | POA: Diagnosis not present

## 2021-07-29 DIAGNOSIS — L309 Dermatitis, unspecified: Secondary | ICD-10-CM | POA: Diagnosis not present

## 2021-07-29 NOTE — Progress Notes (Signed)
° °  Julie Brown NOTE Patient name: Julie Brown MRN [id number]  Date of birth: July 14, 1950  I connected with patient on 07/29/21 at  8:30 AM EST by video and verified that I am speaking with the correct person using two identifiers.  Pt is not currently in the office, she is at home.  Provider is in the office.    I discussed the limitations, risks, security and privacy concerns of performing an evaluation and management service by telephone and the availability of in person appointments. I also discussed with the patient that there may be a patient responsible charge related to this service. The patient expressed understanding and agreed to proceed.   Chief Complaint:   Follow-up (On Premarin; pt likes med!!! )  History of Present Illness:   Julie Brown is a 71 y.o. G1P1001 PM female being evaluated today for follow up regarding:  Vulvar dermatitis and atrophy- Last seen 04/23/21- being treated with low-dose steroid cream that seemed to be helping.  Additionally, she was started on premarin cream.  Today she notes that the cream is working well.  Rates her symptoms 2/10.  Denies itching or irritation currently.  Denies vaginal bleeding or discharge.  Using the premarin cream twice per week, but it does sound like she is using more than prescribed (full tube).  She is also still using the steroid cream every other day, which is still working well. She reports no other acute complaints  Depression screen [facility] 2/9 01/16/2021 06/10/2020  Decreased Interest 0 0  Down, Depressed, Hopeless 0 0  PHQ - 2 Score 0 0    No LMP recorded. Patient is postmenopausal.  Review of Systems:   Pertinent items are noted in HPI Denies fever/chills, dizziness, headaches, visual disturbances, fatigue, shortness of breath, chest pain, abdominal pain, vomiting, problems with periods, bowel movements, urination, or intercourse unless otherwise stated above.  Pertinent History Reviewed:   Reviewed past medical,surgical, social, obstetrical and family history.  Reviewed problem list, medications and allergies. Physical Assessment:   Vitals:   07/29/21 0828  Weight: 203 lb (92.1 kg)  Height: 5\' 4"  (1.626 m)  Body mass index is 34.84 kg/m.       Physical Examination:   General:  Alert, oriented and cooperative.   Mental Status: Normal mood and affect perceived. Normal judgment and thought content.  Physical exam deferred due to nature of the Brown  No results found for this or any previous visit (from the past 24 hour(s)).  Assessment & Plan:  1) Vaginal atrophy- reviewed appropriate dose of premarin cream, ok to use even once weekly to help preserve medication []  pt may call if prefers [facility]  2) Vulvar dermatitis- continue with low dose steroid cream  3) preventive screening -reviewed guidelines- pap no longer indicated -encouraged annual breast/pelvic exam  I provided 15 minutes of non-face-to-face time during this Brown.   Return in about 1 year (around 07/29/2022) for with Dr. Nelda Marseille.  Julie Pupa, DO Attending [place], [facility] for [employer], [place]

## 2021-08-27 ENCOUNTER — Other Ambulatory Visit (HOSPITAL_COMMUNITY): Payer: Self-pay | Admitting: Obstetrics & Gynecology

## 2021-08-27 ENCOUNTER — Encounter: Payer: Self-pay | Admitting: Family Medicine

## 2021-08-27 DIAGNOSIS — Z1231 Encounter for screening mammogram for malignant neoplasm of breast: Secondary | ICD-10-CM

## 2021-08-27 DIAGNOSIS — R609 Edema, unspecified: Secondary | ICD-10-CM

## 2021-08-27 MED ORDER — POTASSIUM CHLORIDE ER 10 MEQ PO TBCR: 10.0000 meq | EXTENDED_RELEASE_TABLET | Freq: Every day | ORAL | 1 refills | Status: DC

## 2021-08-27 MED ORDER — FUROSEMIDE 20 MG PO TABS: 20.0000 mg | ORAL_TABLET | Freq: Every day | ORAL | 1 refills | Status: DC

## 2021-09-01 ENCOUNTER — Ambulatory Visit (HOSPITAL_COMMUNITY)
Admission: RE | Admit: 2021-09-01 | Discharge: 2021-09-01 | Disposition: A | Discharge status: Home / Self Care | Payer: Medicare HMO | Source: Ambulatory Visit | Attending: Obstetrics & Gynecology | Admitting: Obstetrics & Gynecology

## 2021-09-01 ENCOUNTER — Other Ambulatory Visit: Payer: Self-pay

## 2021-09-01 DIAGNOSIS — Z1231 Encounter for screening mammogram for malignant neoplasm of breast: Secondary | ICD-10-CM | POA: Insufficient documentation

## 2021-09-11 DIAGNOSIS — H353131 Nonexudative age-related macular degeneration, bilateral, early dry stage: Secondary | ICD-10-CM | POA: Diagnosis not present

## 2021-10-10 DIAGNOSIS — M1711 Unilateral primary osteoarthritis, right knee: Secondary | ICD-10-CM | POA: Diagnosis not present

## 2021-10-17 DIAGNOSIS — M1711 Unilateral primary osteoarthritis, right knee: Secondary | ICD-10-CM | POA: Diagnosis not present

## 2021-10-24 ENCOUNTER — Other Ambulatory Visit: Payer: Self-pay | Admitting: Family Medicine

## 2021-10-24 DIAGNOSIS — M1711 Unilateral primary osteoarthritis, right knee: Secondary | ICD-10-CM | POA: Diagnosis not present

## 2021-10-30 DIAGNOSIS — R609 Edema, unspecified: Secondary | ICD-10-CM | POA: Diagnosis not present

## 2021-10-30 DIAGNOSIS — M1990 Unspecified osteoarthritis, unspecified site: Secondary | ICD-10-CM | POA: Diagnosis not present

## 2021-10-30 DIAGNOSIS — Z809 Family history of malignant neoplasm, unspecified: Secondary | ICD-10-CM | POA: Diagnosis not present

## 2021-10-30 DIAGNOSIS — Z008 Encounter for other general examination: Secondary | ICD-10-CM | POA: Diagnosis not present

## 2021-10-30 DIAGNOSIS — E669 Obesity, unspecified: Secondary | ICD-10-CM | POA: Diagnosis not present

## 2021-10-30 DIAGNOSIS — Z87891 Personal history of nicotine dependence: Secondary | ICD-10-CM | POA: Diagnosis not present

## 2021-10-30 DIAGNOSIS — Z6836 Body mass index (BMI) 36.0-36.9, adult: Secondary | ICD-10-CM | POA: Diagnosis not present

## 2021-10-30 DIAGNOSIS — Z8249 Family history of ischemic heart disease and other diseases of the circulatory system: Secondary | ICD-10-CM | POA: Diagnosis not present

## 2021-10-30 DIAGNOSIS — R69 Illness, unspecified: Secondary | ICD-10-CM | POA: Diagnosis not present

## 2021-11-04 ENCOUNTER — Encounter: Payer: Self-pay | Admitting: Family Medicine

## 2021-11-05 ENCOUNTER — Encounter: Payer: Self-pay | Admitting: Internal Medicine

## 2021-11-25 ENCOUNTER — Ambulatory Visit: Payer: Medicare HMO | Admitting: Internal Medicine

## 2021-11-26 ENCOUNTER — Ambulatory Visit (INDEPENDENT_AMBULATORY_CARE_PROVIDER_SITE_OTHER): Payer: Medicare HMO | Admitting: Family Medicine

## 2021-11-26 VITALS — BP 136/88 | HR 73 | Temp 97.8°F | Ht 63.75 in | Wt 207.2 lb

## 2021-11-26 DIAGNOSIS — E785 Hyperlipidemia, unspecified: Secondary | ICD-10-CM | POA: Diagnosis not present

## 2021-11-26 DIAGNOSIS — K760 Fatty (change of) liver, not elsewhere classified: Secondary | ICD-10-CM | POA: Diagnosis not present

## 2021-11-26 DIAGNOSIS — R739 Hyperglycemia, unspecified: Secondary | ICD-10-CM | POA: Diagnosis not present

## 2021-11-26 DIAGNOSIS — Z23 Encounter for immunization: Secondary | ICD-10-CM

## 2021-11-26 DIAGNOSIS — Z0000 Encounter for general adult medical examination without abnormal findings: Secondary | ICD-10-CM

## 2021-11-26 DIAGNOSIS — Z130 Encounter for screening for diseases of the blood and blood-forming organs and certain disorders involving the immune mechanism: Secondary | ICD-10-CM | POA: Diagnosis not present

## 2021-11-26 NOTE — Progress Notes (Addendum)
Subjective:    Julie Brown is a 71 y.o. female who presents for a Welcome to Medicare exam.   She is doing well. Reports that she has lost weight (intentionally).   Review of Systems Denies chest pain, SOB, nausea, vomiting, diarrhea; no vision changes or difficulty hearing.        Objective:    Today's Vitals   11/26/21 0857  BP: 136/88  Pulse: 73  Temp: 97.8 F (36.6 C)  TempSrc: Oral  SpO2: 98%  Weight: 207 lb 3.2 oz (94 kg)  Height: 5' 3.75" (1.619 m)  Body mass index is 35.85 kg/m.  Medications Outpatient Encounter Medications as of 11/26/2021  Medication Sig   albuterol (VENTOLIN HFA) 108 (90 Base) MCG/ACT inhaler Inhale 2 puffs into the lungs every 6 (six) hours as needed for wheezing or shortness of breath.   B Complex-Biotin-FA (SUPER B-COMPLEX PO) Take 1 tablet by mouth daily.    calcium carbonate (TUMS - DOSED IN MG ELEMENTAL CALCIUM) 500 MG chewable tablet Chew 1-2 tablets by mouth daily.   colestipol (COLESTID) 1 g tablet Take 1 tablet (1 g total) by mouth daily. (Patient taking differently: Take 1 g by mouth daily. As needed)   conjugated estrogens (PREMARIN) vaginal cream Premarin 0.625 mg/gram vaginal cream  INSERT 1 APPLICATORFUL VAGINALLY TWICE A WEEK   Dupilumab (DUPIXENT) 300 MG/2ML SOPN Inject 300 mg into the skin every 14 (fourteen) days.   Fluocinonide 0.1 % CREA APPLY ONCE DAILY TO AFFECTED AREA AS NEEDED FOR UP TO 2 WEEKS AT A TIME   fluticasone (FLONASE) 50 MCG/ACT nasal spray Place 2 sprays into both nostrils daily. (Patient taking differently: Place 2 sprays into both nostrils.)   furosemide (LASIX) 20 MG tablet Take 1 tablet (20 mg total) by mouth daily.   hydrocortisone (ANUSOL-HC) 2.5 % rectal cream Place 1 application rectally 2 (two) times daily. (Patient taking differently: Place 1 application. rectally 2 (two) times daily as needed for hemorrhoids.)   Multiple Vitamins-Calcium (ONE-A-DAY WOMENS PO) Take 1 tablet by mouth daily.     Multiple Vitamins-Minerals (ZINC PO) Take 50 mg by mouth daily.   potassium chloride (KLOR-CON) 10 MEQ tablet Take 1 tablet (10 mEq total) by mouth daily.   triamcinolone cream (KENALOG) 0.1 % Apply 1 application topically daily as needed (eczema).    [DISCONTINUED] Biotin w/ Vitamins C & E (HAIR/SKIN/NAILS PO) Take 1 tablet by mouth daily.    No facility-administered encounter medications on file as of 11/26/2021.    History: Past Medical History:  Diagnosis Date   Hayfever    Hyperlipidemia    Migraine headache    PONV (postoperative nausea and vomiting)    Past Surgical History:  Procedure Laterality Date   BIOPSY  08/15/2020   Procedure: BIOPSY;  Surgeon: Daneil Dolin, MD;  Location: [facility];  Service: Endoscopy;;   BREAST SURGERY     lump removed   CATARACT EXTRACTION W/PHACO Right 06/02/2021   Procedure: CATARACT EXTRACTION PHACO AND INTRAOCULAR LENS PLACEMENT RIGHT EYE;  Surgeon: Baruch Goldmann, MD;  Location: [facility];  Service: Ophthalmology;  Laterality: Right;  right CDE=8.77   CATARACT EXTRACTION W/PHACO Left 06/16/2021   Procedure: CATARACT EXTRACTION LEFT EYE W/ PHACO AND INTRAOCULAR LENS PLACEMENT (IOC);  Surgeon: Baruch Goldmann, MD;  Location: [facility];  Service: Ophthalmology;  Laterality: Left;  CDE: 6.86    CHOLECYSTECTOMY  01/06/2012   Procedure: LAPAROSCOPIC CHOLECYSTECTOMY;  Surgeon: Donato Heinz, MD;  Location: [facility];  Service:  General;  Laterality: N/A;   COLONOSCOPY  07/22/06   YPP:JKDTOI and colon polyps/left-sided diverticula. adenomatous colon polyps. surveillance TCS due 2013   COLONOSCOPY N/A 12/25/2015   sigmoid diverticulosis, multiple colonic polyps removed, s/p segmental colonic biopsies. Tubular adenomas, negative microscopic colitis. Surveillance 2022.    COLONOSCOPY WITH PROPOFOL N/A 08/15/2020   Sigmoid and descending colon diverticulosis, s/p segmental biopsy. 5 year colonoscopy. Biopsy without microscopic colitis.     ESOPHAGOGASTRODUODENOSCOPY  07/22/06   ZTI:WPYKDX/IPJAS HH otherwise normal   FOOT SURGERY     left foot   MENISCUS REPAIR Right 12/28/2017   TUBAL LIGATION      Family History  Problem Relation Age of Onset   Colon cancer Mother        age 20   Kidney cancer Mother        cause of death, 2 cancer surgeries, died at age 8   Diabetes Brother    Hypertension Brother    Cancer Brother    Social History   Occupational History   Occupation: bookeeping   Tobacco Use   Smoking status: Former   Smokeless tobacco: Never   Tobacco comments:    quit 1990  Vaping Use   Vaping Use: Never used  Substance and Sexual Activity   Alcohol use: Not Currently    Alcohol/week: 0.0 standard drinks    Comment: occasional, not daily   Drug use: No   Sexual activity: Not Currently    Birth control/protection: Post-menopausal    Tobacco Counseling Counseling given: Not Answered Tobacco comments: quit 1990  Immunizations and Health Maintenance Immunization History  Administered Date(s) Administered   Influenza, High Dose Seasonal PF 05/05/2018, 03/31/2019, 04/08/2021   Influenza,inj,quad, With Preservative 03/06/2017, 03/06/2018   Influenza-Unspecified 03/06/2010, 03/24/2014, 02/25/2016, 02/26/2017, 05/05/2018, 04/12/2020   Moderna Covid-19 Vaccine Bivalent Booster 35yr & up 04/25/2021   Moderna Sars-Covid-2 Vaccination 08/31/2019, 09/29/2019, 05/03/2020, 11/18/2020   PNEUMOCOCCAL CONJUGATE-20 11/26/2021   Zoster Recombinat (Shingrix) 10/17/2019, 12/13/2019   There are no preventive care reminders to display for this patient.   Activities of Daily Living    06/06/2021    1:48 PM 06/06/2021    1:45 PM  In your present state of health, do you have any difficulty performing the following activities:  Hearing?  0  Vision?  1  Difficulty concentrating or making decisions?  0  Walking or climbing stairs?  0  Dressing or bathing?  0  Doing errands, shopping? 0     Physical  Exam Gen.: Well-appearing elderly female in no acute distress. HENT: Normocephalic atraumatic. Normal TMs bilaterally. Oropharynx clear.  Eyes: No scleral icterus. Normal conjunctiva. Neck: Supple. No thyromegaly or adenopathy. Heart: Regular rate and rhythm. No murmurs appreciated. No lower extremity edema. Lungs: Clear auscultation bilaterally. No rales, rhonchi, or wheezing. Abdomen: Soft, nontender, nondistended. No palpable organomegaly. No rebound or guarding. MSK: Normal range of motion. Neuro: No focal deficits. Psych: Normal mood and affect.   Advanced Directives: Has living will.  Assessment:    This is a routine wellness examination for this patient .  Vision/Hearing screen Has had recent eye exam. No issues or concerns regarding vision or hearing.  Dietary issues and exercise activities discussed:  Discussed healthy diet and regular exercise to promote weight loss.  Depression Screen    01/16/2021    1:39 PM 06/10/2020    9:42 AM  PHQ 2/9 Scores  PHQ - 2 Score 0 0  PHQ-2 Negative today.  Fall Risk    11/26/2021  8:55 AM  Fall Risk   Falls in the past year? 0  Number falls in past yr: 0  Injury with Fall? 0  Risk for fall due to : No Fall Risks  Follow up Falls evaluation completed  Not a fall risk.  Cognitive Function: Normal cognitive function. No concerns.  Patient Care Team: Coral Spikes, DO as PCP - General (Family Medicine) Lavonna Monarch, MD as Consulting Physician (Dermatology)     Plan:    I have personally reviewed and noted the following in the patient's chart:   Medical and social history Use of alcohol, tobacco or illicit drugs  Current medications and supplements Functional ability and status Nutritional status Physical activity Advanced directives List of other physicians Hospitalizations, surgeries, and ER visits in previous 12 months Vitals Screenings to include cognitive, depression, and falls Referrals and  appointments  In addition, I have reviewed and discussed with patient certain preventive protocols, quality metrics, and best practice recommendations. A written personalized care plan for preventive services as well as general preventive health recommendations were provided to patient.     Coral Spikes, DO 11/26/2021

## 2021-11-26 NOTE — Addendum Note (Signed)
Addended by: Madelin Rear on: 11/26/2021 10:07 AM   Modules accepted: Orders

## 2021-11-26 NOTE — Patient Instructions (Signed)
Labs today.  Pneumonia vaccine given today.  Follow up in 6 months.  Take care  Dr. Lacinda Axon

## 2021-11-27 ENCOUNTER — Encounter: Payer: Self-pay | Admitting: Family Medicine

## 2021-11-27 LAB — CMP14+EGFR
ALT: 22 IU/L (ref 0–32)
AST: 17 IU/L (ref 0–40)
Albumin/Globulin Ratio: 2 (ref 1.2–2.2)
Albumin: 4.3 g/dL (ref 3.8–4.8)
Alkaline Phosphatase: 154 IU/L — ABNORMAL HIGH (ref 44–121)
BUN/Creatinine Ratio: 21 (ref 12–28)
BUN: 17 mg/dL (ref 8–27)
Bilirubin Total: 0.4 mg/dL (ref 0.0–1.2)
CO2: 22 mmol/L (ref 20–29)
Calcium: 9.8 mg/dL (ref 8.7–10.3)
Chloride: 105 mmol/L (ref 96–106)
Creatinine, Ser: 0.82 mg/dL (ref 0.57–1.00)
Globulin, Total: 2.1 g/dL (ref 1.5–4.5)
Glucose: 104 mg/dL — ABNORMAL HIGH (ref 70–99)
Potassium: 4.3 mmol/L (ref 3.5–5.2)
Sodium: 141 mmol/L (ref 134–144)
Total Protein: 6.4 g/dL (ref 6.0–8.5)
eGFR: 77 mL/min/{1.73_m2} (ref >59)

## 2021-11-27 LAB — LIPID PANEL
Chol/HDL Ratio: 3.9 ratio (ref 0.0–4.4)
Cholesterol, Total: 240 mg/dL — ABNORMAL HIGH (ref 100–199)
HDL: 62 mg/dL (ref >39)
LDL Chol Calc (NIH): 152 mg/dL — ABNORMAL HIGH (ref 0–99)
Triglycerides: 146 mg/dL (ref 0–149)
VLDL Cholesterol Cal: 26 mg/dL (ref 5–40)

## 2021-11-27 LAB — GAMMA GT: GGT: 19 IU/L (ref 0–60)

## 2021-11-27 LAB — CBC
Hematocrit: 43.8 % (ref 34.0–46.6)
Hemoglobin: 14.8 g/dL (ref 11.1–15.9)
MCH: 31.3 pg (ref 26.6–33.0)
MCHC: 33.8 g/dL (ref 31.5–35.7)
MCV: 93 fL (ref 79–97)
Platelets: 254 10*3/uL (ref 150–450)
RBC: 4.73 x10E6/uL (ref 3.77–5.28)
RDW: 13.1 % (ref 11.7–15.4)
WBC: 5.4 10*3/uL (ref 3.4–10.8)

## 2021-11-27 LAB — HEMOGLOBIN A1C
Est. average glucose Bld gHb Est-mCnc: 103 mg/dL
Hgb A1c MFr Bld: 5.2 % (ref 4.8–5.6)

## 2021-12-01 ENCOUNTER — Other Ambulatory Visit: Payer: Self-pay | Admitting: Family Medicine

## 2021-12-01 MED ORDER — ROSUVASTATIN CALCIUM 10 MG PO TABS: 10.0000 mg | ORAL_TABLET | Freq: Every day | ORAL | 3 refills | Status: DC

## 2021-12-02 ENCOUNTER — Ambulatory Visit: Payer: Medicare HMO | Admitting: Internal Medicine

## 2021-12-02 ENCOUNTER — Encounter: Payer: Self-pay | Admitting: Internal Medicine

## 2021-12-02 VITALS — BP 138/82 | HR 81 | Temp 97.7°F | Ht 63.75 in | Wt 208.8 lb

## 2021-12-02 DIAGNOSIS — R748 Abnormal levels of other serum enzymes: Secondary | ICD-10-CM

## 2021-12-02 DIAGNOSIS — R7989 Other specified abnormal findings of blood chemistry: Secondary | ICD-10-CM | POA: Diagnosis not present

## 2021-12-02 DIAGNOSIS — D509 Iron deficiency anemia, unspecified: Secondary | ICD-10-CM | POA: Diagnosis not present

## 2021-12-02 NOTE — Progress Notes (Unsigned)
Primary Care Physician:  Coral Spikes, DO Primary Gastroenterologist:  Dr. Gala Romney  Pre-Procedure History & Physical: HPI:  Julie Brown is a 71 y.o. female here for follow-up of diarrhea, history of colonic adenoma, family history colon cancer in first-degree relative at a young age.  [name] has adopted a plant-based regimen dietary supplement which has helped her lose approximately 40 pounds since her last office visit.  She was congratulated.  This has been associated with normalization of bowel function.  She is not having diarrhea anymore.  She feels well.  She tells me she just started on Crestor 10 mg daily for persistently elevated LDL cholesterol in the 150 rang.  Also, notes she has had alkaline phosphatase to 120-150 range with normal transaminases over the past year.  Steatosis on prior ultrasound.  Symptomatic gallbladder disease status postcholecystectomy years ago.  Her GGT was checked and came back normal at 19.  Past Medical History:  Diagnosis Date   Hayfever    Hyperlipidemia    Migraine headache    PONV (postoperative nausea and vomiting)     Past Surgical History:  Procedure Laterality Date   BIOPSY  08/15/2020   Procedure: BIOPSY;  Surgeon: Daneil Dolin, MD;  Location: [facility];  Service: Endoscopy;;   BREAST SURGERY     lump removed   CATARACT EXTRACTION W/PHACO Right 06/02/2021   Procedure: CATARACT EXTRACTION PHACO AND INTRAOCULAR LENS PLACEMENT RIGHT EYE;  Surgeon: Baruch Goldmann, MD;  Location: [facility];  Service: Ophthalmology;  Laterality: Right;  right CDE=8.77   CATARACT EXTRACTION W/PHACO Left 06/16/2021   Procedure: CATARACT EXTRACTION LEFT EYE W/ PHACO AND INTRAOCULAR LENS PLACEMENT (IOC);  Surgeon: Baruch Goldmann, MD;  Location: [facility];  Service: Ophthalmology;  Laterality: Left;  CDE: 6.86    CHOLECYSTECTOMY  01/06/2012   Procedure: LAPAROSCOPIC CHOLECYSTECTOMY;  Surgeon: Donato Heinz, MD;  Location: [facility];  Service: General;   Laterality: N/A;   COLONOSCOPY  07/22/06   EYC:XKGYJE and colon polyps/left-sided diverticula. adenomatous colon polyps. surveillance TCS due 2013   COLONOSCOPY N/A 12/25/2015   sigmoid diverticulosis, multiple colonic polyps removed, s/p segmental colonic biopsies. Tubular adenomas, negative microscopic colitis. Surveillance 2022.    COLONOSCOPY WITH PROPOFOL N/A 08/15/2020   Sigmoid and descending colon diverticulosis, s/p segmental biopsy. 5 year colonoscopy. Biopsy without microscopic colitis.    ESOPHAGOGASTRODUODENOSCOPY  07/22/06   HUD:JSHFWY/OVZCH HH otherwise normal   FOOT SURGERY     left foot   MENISCUS REPAIR Right 12/28/2017   TUBAL LIGATION      Prior to Admission medications   Medication Sig Start Date End Date Taking? Authorizing Provider  B Complex-Biotin-FA (SUPER B-COMPLEX PO) Take 1 tablet by mouth daily.    Yes [provider]  conjugated estrogens (PREMARIN) vaginal cream Premarin 0.625 mg/gram vaginal cream  INSERT 1 APPLICATORFUL VAGINALLY TWICE A WEEK   Yes [provider]  Dupilumab (DUPIXENT) 300 MG/2ML SOPN Inject 300 mg into the skin every 14 (fourteen) days. 01/07/21  Yes Lavonna Monarch, MD  Fluocinonide 0.1 % CREA APPLY ONCE DAILY TO AFFECTED AREA AS NEEDED FOR UP TO 2 WEEKS AT A TIME 10/26/21  Yes Cook, Jayce G, DO  furosemide (LASIX) 20 MG tablet Take 1 tablet (20 mg total) by mouth daily. 08/27/21  Yes Cook, Jayce G, DO  hydrocortisone (ANUSOL-HC) 2.5 % rectal cream Place 1 application rectally 2 (two) times daily. Patient taking differently: Place 1 application. rectally 2 (two) times daily as needed  for hemorrhoids. 05/07/20  Yes Annitta Needs, NP  Multiple Vitamins-Calcium (ONE-A-DAY WOMENS PO) Take 1 tablet by mouth daily.    Yes [provider]  Multiple Vitamins-Minerals (ZINC PO) Take 50 mg by mouth daily.   Yes [provider]  potassium chloride (KLOR-CON) 10 MEQ tablet Take 1 tablet (10 mEq total) by mouth daily.  08/27/21  Yes Cook, Jayce G, DO  rosuvastatin (CRESTOR) 10 MG tablet Take 1 tablet (10 mg total) by mouth daily. 12/01/21  Yes Cook, Jayce G, DO  triamcinolone cream (KENALOG) 0.1 % Apply 1 application topically daily as needed (eczema).  04/06/19  Yes [provider]    Allergies as of 12/02/2021 - Review Complete 12/02/2021  Allergen Reaction Noted   Codeine Nausea And Vomiting    Hydrocodone Nausea And Vomiting 11/25/2012    Family History  Problem Relation Age of Onset   Colon cancer Mother        age 20   Kidney cancer Mother        cause of death, 21 cancer surgeries, died at age 49   Diabetes Brother    Hypertension Brother    Cancer Brother     Social History   Socioeconomic History   Marital status: Married    Spouse name: Not on file   Number of children: 1   Years of education: Not on file   Highest education level: Not on file  Occupational History   Occupation: bookeeping   Tobacco Use   Smoking status: Former   Smokeless tobacco: Never   Tobacco comments:    quit 1990  Vaping Use   Vaping Use: Never used  Substance and Sexual Activity   Alcohol use: Not Currently    Alcohol/week: 0.0 standard drinks    Comment: occasional, not daily   Drug use: No   Sexual activity: Not Currently    Birth control/protection: Post-menopausal  Other Topics Concern   Not on file  Social History Narrative   Not on file   Social Determinants of Health   Financial Resource Strain: Not on file  Food Insecurity: Not on file  Transportation Needs: Not on file  Physical Activity: Not on file  Stress: Not on file  Social Connections: Not on file  Intimate Partner Violence: Not on file    Review of Systems: See HPI, otherwise negative ROS  Physical Exam: BP 138/82 (BP Location: Right Arm, Patient Position: Sitting, Cuff Size: Normal)   Pulse 81   Temp 97.7 F (36.5 C) (Temporal)   Ht 5' 3.75" (1.619 m)   Wt 208 lb 12.8 oz (94.7 kg)   SpO2 100%   BMI  36.12 kg/m  General:   Alert,  Well-developed, well-nourished, pleasant and cooperative in NAD  Impression/Plan: Very pleasant 71 year old lady who has been able to achieve significant weight loss over the past year.  She was commended.  Her bowel symptoms have settled down.  She is up-to-date on surveillance colonoscopy; we will be due for follow-up semination in 2027  Transaminases more recently normal.  Elevated alkaline phosphatase with normal GGT noted.  This is somewhat nonspecific.  She needs at least a one-time AMA general  Recommendations:  We will draw a serum AMA just to double check elevations in your alkaline phosphatase blood value to rule out a condition called PBC.  (Primary biliary cholangitis), a liver condition.  We will plan for a repeat colonoscopy in 2027.   Notice: This dictation was prepared with Dragon dictation  along with smaller phrase technology. Any transcriptional errors that result from this process are unintentional and may not be corrected upon review.

## 2021-12-02 NOTE — Patient Instructions (Signed)
It was great seeing you again today!  Congratulations on your weight loss-keep it up!  We will draw a serum AMA just to double check elevations in your alkaline phosphatase blood value to rule out a condition called PBC.  (Primary biliary cholangitis), a liver condition.  We will plan for a repeat colonoscopy in 2027.

## 2021-12-05 LAB — MITOCHONDRIAL ANTIBODIES: Mitochondrial Ab: <20 Units (ref 0.0–20.0)

## 2022-01-26 ENCOUNTER — Encounter: Payer: Self-pay | Admitting: Dermatology

## 2022-01-26 ENCOUNTER — Other Ambulatory Visit: Payer: Self-pay | Admitting: Family Medicine

## 2022-01-26 ENCOUNTER — Ambulatory Visit: Payer: Medicare HMO | Admitting: Dermatology

## 2022-01-26 ENCOUNTER — Other Ambulatory Visit: Payer: Self-pay | Admitting: Dermatology

## 2022-01-26 DIAGNOSIS — L2084 Intrinsic (allergic) eczema: Secondary | ICD-10-CM | POA: Diagnosis not present

## 2022-01-26 DIAGNOSIS — D1800 Hemangioma unspecified site: Secondary | ICD-10-CM

## 2022-01-26 DIAGNOSIS — R609 Edema, unspecified: Secondary | ICD-10-CM

## 2022-01-26 MED ORDER — DUPIXENT 300 MG/2ML ~~LOC~~ SOAJ: 300.0000 mg | SUBCUTANEOUS | 1 refills | Status: DC

## 2022-01-26 MED ORDER — DUPIXENT 300 MG/2ML ~~LOC~~ SOAJ: 300.0000 mg | SUBCUTANEOUS | 11 refills | Status: DC

## 2022-01-26 NOTE — Patient Instructions (Signed)
Flare on eyelids- get over the counter hydrocortisone cream

## 2022-01-27 MED ORDER — DUPIXENT 300 MG/2ML ~~LOC~~ SOAJ: 300.0000 mg | SUBCUTANEOUS | 3 refills | Status: DC

## 2022-01-27 NOTE — Telephone Encounter (Signed)
Patient seen in office yesterday- wanted three month supply of dupixent versus  month one supply. New prescription sent into patient pharmacy.

## 2022-02-02 ENCOUNTER — Encounter: Payer: Self-pay | Admitting: Dermatology

## 2022-02-13 ENCOUNTER — Encounter: Payer: Self-pay | Admitting: Dermatology

## 2022-02-14 ENCOUNTER — Encounter: Payer: Self-pay | Admitting: Dermatology

## 2022-02-14 NOTE — Progress Notes (Signed)
   Follow-Up Visit   Subjective  Julie Brown is a 71 y.o. female who presents for the following: Eczema (F/u- dupixent- keeps pretty much clear- 90% clear).  Eczema, doing well on Dupixent.  Check spot eyelid. Location:  Duration:  Quality:  Associated Signs/Symptoms: Modifying Factors:  Severity:  Timing: Context:   Objective  Well appearing patient in no apparent distress; mood and affect are within normal limits. Small areas with focal chronic dermatitis  Right Upper Eyelid 2 Millimeter smooth red dermal papule, typical dermoscopy    A focused examination was performed including head, neck, arms, legs, back. Relevant physical exam findings are noted in the Assessment and Plan.   Assessment & Plan    Intrinsic atopic dermatitis  Otc hydrocortisone cream if fails protopic ointment continue Dupixent.  Emphasized continued hydration of skin.  Related Medications Dupilumab (DUPIXENT) 300 MG/2ML SOPN Inject 300 mg into the skin every 14 (fourteen) days.  Hemangioma, unspecified site Right Upper Eyelid  No intervention currently necessary      I, Lavonna Monarch, MD, have reviewed all documentation for this visit.  The documentation on 02/14/22 for the exam, diagnosis, procedures, and orders are all accurate and complete.

## 2022-02-27 ENCOUNTER — Other Ambulatory Visit: Payer: Self-pay | Admitting: Family Medicine

## 2022-03-06 DIAGNOSIS — M1711 Unilateral primary osteoarthritis, right knee: Secondary | ICD-10-CM | POA: Diagnosis not present

## 2022-03-11 ENCOUNTER — Ambulatory Visit (INDEPENDENT_AMBULATORY_CARE_PROVIDER_SITE_OTHER): Payer: Medicare HMO | Admitting: Nurse Practitioner

## 2022-03-11 ENCOUNTER — Encounter: Payer: Self-pay | Admitting: Family Medicine

## 2022-03-11 ENCOUNTER — Encounter: Payer: Self-pay | Admitting: Nurse Practitioner

## 2022-03-11 VITALS — BP 131/73 | HR 92 | Temp 98.1°F | Wt 215.2 lb

## 2022-03-11 DIAGNOSIS — J329 Chronic sinusitis, unspecified: Secondary | ICD-10-CM | POA: Diagnosis not present

## 2022-03-11 DIAGNOSIS — J310 Chronic rhinitis: Secondary | ICD-10-CM

## 2022-03-11 MED ORDER — AMOXICILLIN-POT CLAVULANATE 875-125 MG PO TABS: 1.0000 | ORAL_TABLET | Freq: Two times a day (BID) | ORAL | 0 refills | Status: AC

## 2022-03-11 NOTE — Progress Notes (Signed)
Subjective:    Patient ID: Julie Brown, female    DOB: 1950/10/12, 71 y.o.   MRN: [id number]  HPI  Pt arrives with head congestion and ear ache x5 days. Pt states it is a sinus infection and she gets them each spring and each fall. Pt states symptoms have been going on for a couple of days.   Patient denies any fever, body aches, chills, runny nose, wheezing, shortness of breath, difficulty breathing.   Review of Systems  HENT:  Positive for sinus pressure.   All other systems reviewed and are negative.      Objective:   Physical Exam Vitals reviewed.  Constitutional:      General: She is not in acute distress.    Appearance: Normal appearance. She is normal weight. She is not ill-appearing, toxic-appearing or diaphoretic.  HENT:     Head: Normocephalic and atraumatic.     Right Ear: Ear canal and external ear normal. A middle ear effusion is present.     Left Ear: Ear canal and external ear normal. A middle ear effusion is present.     Nose: Congestion and rhinorrhea present.     Right Turbinates: Not enlarged, swollen or pale.     Left Turbinates: Not enlarged, swollen or pale.     Right Sinus: Maxillary sinus tenderness and frontal sinus tenderness present.     Left Sinus: Maxillary sinus tenderness and frontal sinus tenderness present.     Mouth/Throat:     Mouth: Mucous membranes are moist.     Pharynx: Oropharynx is clear. No oropharyngeal exudate or posterior oropharyngeal erythema.  Eyes:     General: No scleral icterus.       Right eye: No discharge.        Left eye: No discharge.     Extraocular Movements: Extraocular movements intact.     Conjunctiva/sclera: Conjunctivae normal.     Pupils: Pupils are equal, round, and reactive to light.  Cardiovascular:     Rate and Rhythm: Normal rate and regular rhythm.     Pulses: Normal pulses.     Heart sounds: Normal heart sounds. No murmur heard. Pulmonary:     Effort: Pulmonary effort is normal. No respiratory  distress.     Breath sounds: Normal breath sounds. No wheezing.  Musculoskeletal:     Cervical back: Normal range of motion and neck supple. No rigidity or tenderness.     Comments: Grossly intact  Lymphadenopathy:     Cervical: Cervical adenopathy present.  Skin:    General: Skin is warm.     Capillary Refill: Capillary refill takes less than 2 seconds.  Neurological:     Mental Status: She is alert.     Comments: Grossly intact  Psychiatric:        Mood and Affect: Mood normal.        Behavior: Behavior normal.           Assessment & Plan:   1. Rhinosinusitis -Suspect viral or allergic rhinosinusitis -However due to patient's history of recurrent rhinosinusitis we will also prescribe antibiotic -Patient encouraged to use Flonase, nasal sprays, Mucinex (no DM) for symptom management to hopefully prevent bacterial infection -However if patient not better in 2 days she may start taking - amoxicillin-clavulanate (AUGMENTIN) 875-125 MG tablet; Take 1 tablet by mouth 2 (two) times daily for 10 days.  Dispense: 20 tablet; Refill: 0 -Also testing for COVID - COVID-19, Flu A+B and RSV -Return to clinic if symptoms  do not improve or worsen    Note:  This document was prepared using Dragon voice recognition software and may include unintentional dictation errors. Note - This record has been created using [employer].  Chart creation errors have been sought, but may not always  have been located. Such creation errors do not reflect on  the standard of medical care.

## 2022-03-11 NOTE — Telephone Encounter (Signed)
Pt contacted and scheduled at 3:10 with NP

## 2022-03-12 LAB — COVID-19, FLU A+B AND RSV
Influenza A, NAA: NOT DETECTED
Influenza B, NAA: NOT DETECTED
RSV, NAA: NOT DETECTED
SARS-CoV-2, NAA: NOT DETECTED

## 2022-03-23 ENCOUNTER — Other Ambulatory Visit: Payer: Self-pay | Admitting: Obstetrics & Gynecology

## 2022-03-23 ENCOUNTER — Encounter: Payer: Self-pay | Admitting: Obstetrics & Gynecology

## 2022-03-23 DIAGNOSIS — N905 Atrophy of vulva: Secondary | ICD-10-CM

## 2022-03-23 MED ORDER — PREMARIN 0.625 MG/GM VA CREA: TOPICAL_CREAM | VAGINAL | 2 refills | Status: DC

## 2022-03-23 NOTE — Progress Notes (Signed)
Rx to [employer]- premarin  Janyth Pupa, DO Attending Kathryn, [facility] for [employer], [place]

## 2022-04-16 ENCOUNTER — Encounter: Payer: Self-pay | Admitting: Family Medicine

## 2022-04-16 ENCOUNTER — Ambulatory Visit (INDEPENDENT_AMBULATORY_CARE_PROVIDER_SITE_OTHER): Payer: Medicare HMO | Admitting: Family Medicine

## 2022-04-16 VITALS — BP 120/76 | HR 89 | Temp 97.7°F | Wt 217.0 lb

## 2022-04-16 DIAGNOSIS — J0110 Acute frontal sinusitis, unspecified: Secondary | ICD-10-CM | POA: Diagnosis not present

## 2022-04-16 MED ORDER — AMOXICILLIN-POT CLAVULANATE 875-125 MG PO TABS: 1.0000 | ORAL_TABLET | Freq: Two times a day (BID) | ORAL | 0 refills | Status: DC

## 2022-04-16 NOTE — Progress Notes (Signed)
Subjective:  Patient ID: Julie Brown, female    DOB: Mar 28, 1951  Age: 71 y.o. MRN: [id number]  CC: Chief Complaint  Patient presents with   Facial Pain    Headache and hoarseness , sinus pain , ear pain x 4 days     HPI:  71 year old female presents for evaluation of the above.  Patient reports that she has been sick since 'Sunday.  Reports sinus pain and pressure.  Mostly in the frontal region.  Associated hoarseness and headache.  Also reports bilateral ear pain.  No fever.  No significant cough.  Associated fatigue.  No relieving factors.  No reported sick contacts.  No other complaints.  Patient Active Problem List   Diagnosis Date Noted   Acute frontal sinusitis 04/16/2022   Lichen sclerosus et atrophicus of the vulva 04/04/2021   Obesity (BMI 35.0-39.9 without comorbidity) 01/16/2021   Hyperlipidemia LDL goal <130 01/16/2021   Seasonal allergic rhinitis due to pollen 10/22/2020   Subclinical hypothyroidism 06/17/2020   History of colonic polyps    FH: colon cancer in relative <50 years old 12/03/2015   Hepatic steatosis 02/12/2009    Social Hx   Social History   Socioeconomic History   Marital status: Married    Spouse name: Not on file   Number of children: 1   Years of education: Not on file   Highest education level: Not on file  Occupational History   Occupation: bookeeping   Tobacco Use   Smoking status: Former   Smokeless tobacco: Never   Tobacco comments:    quit 1990  Vaping Use   Vaping Use: Never used  Substance and Sexual Activity   Alcohol use: Not Currently    Alcohol/week: 0.0 standard drinks of alcohol    Comment: occasional, not daily   Drug use: No   Sexual activity: Not Currently    Birth control/protection: Post-menopausal  Other Topics Concern   Not on file  Social History Narrative   Not on file   Social Determinants of Health   Financial Resource Strain: Not on file  Food Insecurity: Not on file  Transportation Needs: Not  on file  Physical Activity: Not on file  Stress: Not on file  Social Connections: Not on file    Review of Systems Per HPI  Objective:  BP 120/76   Pulse 89   Temp 97.7 F (36.5 C)   Wt 217 lb (98.4 kg)   SpO2 94%   BMI 37.54 kg/m      10'$ /06/2022    4:12 PM 03/11/2022    3:19 PM 12/02/2021   10:26 AM  BP/Weight  Systolic BP 701 779 390  Diastolic BP 76 73 82  Wt. (Lbs) 217 215.2 208.8  BMI 37.54 kg/m2 37.23 kg/m2 36.12 kg/m2    Physical Exam Vitals and nursing note reviewed.  Constitutional:      General: She is not in acute distress. HENT:     Head: Normocephalic and atraumatic.     Right Ear: Tympanic membrane normal.     Left Ear: Tympanic membrane normal.     Nose: Congestion present.     Comments: Frontal sinus tenderness to palpation.    Mouth/Throat:     Pharynx: Oropharynx is clear.  Cardiovascular:     Rate and Rhythm: Normal rate and regular rhythm.  Pulmonary:     Effort: Pulmonary effort is normal.     Breath sounds: Normal breath sounds. No wheezing or rales.  Neurological:  Mental Status: She is alert.     Lab Results  Component Value Date   WBC 5.4 11/26/2021   HGB 14.8 11/26/2021   HCT 43.8 11/26/2021   PLT 254 11/26/2021   GLUCOSE 104 (H) 11/26/2021   CHOL 240 (H) 11/26/2021   TRIG 146 11/26/2021   HDL 62 11/26/2021   LDLCALC 152 (H) 11/26/2021   ALT 22 11/26/2021   AST 17 11/26/2021   NA 141 11/26/2021   K 4.3 11/26/2021   CL 105 11/26/2021   CREATININE 0.82 11/26/2021   BUN 17 11/26/2021   CO2 22 11/26/2021   TSH 7.120 (H) 06/10/2020   HGBA1C 5.2 11/26/2021     Assessment & Plan:   Problem List Items Addressed This Visit       Respiratory   Acute frontal sinusitis - Primary    Treating with Augmentin.       Relevant Medications   amoxicillin-clavulanate (AUGMENTIN) 875-125 MG tablet    Meds ordered this encounter  Medications   amoxicillin-clavulanate (AUGMENTIN) 875-125 MG tablet    Sig: Take 1 tablet by  mouth 2 (two) times daily.    Dispense:  20 tablet    Refill:  0    Follow-up:  Has scheduled follow up  [place]

## 2022-04-16 NOTE — Assessment & Plan Note (Signed)
Treating with Augmentin. 

## 2022-04-22 ENCOUNTER — Other Ambulatory Visit: Payer: Self-pay | Admitting: Family Medicine

## 2022-04-22 ENCOUNTER — Other Ambulatory Visit: Payer: Self-pay

## 2022-04-22 ENCOUNTER — Encounter: Payer: Self-pay | Admitting: Allergy & Immunology

## 2022-04-22 ENCOUNTER — Encounter: Payer: Self-pay | Admitting: Family Medicine

## 2022-04-22 ENCOUNTER — Ambulatory Visit: Payer: Medicare HMO | Admitting: Allergy & Immunology

## 2022-04-22 VITALS — BP 128/80 | HR 94 | Temp 98.2°F | Resp 18 | Ht 63.75 in | Wt 217.2 lb

## 2022-04-22 DIAGNOSIS — L2089 Other atopic dermatitis: Secondary | ICD-10-CM

## 2022-04-22 DIAGNOSIS — L239 Allergic contact dermatitis, unspecified cause: Secondary | ICD-10-CM

## 2022-04-22 DIAGNOSIS — J301 Allergic rhinitis due to pollen: Secondary | ICD-10-CM

## 2022-04-22 MED ORDER — DOXYCYCLINE HYCLATE 100 MG PO TABS: 100.0000 mg | ORAL_TABLET | Freq: Two times a day (BID) | ORAL | 0 refills | Status: DC

## 2022-04-22 NOTE — Patient Instructions (Addendum)
1. Seasonal allergic rhinitis due to pollen - Let's start with blood work for environmental allergy testing via the blood. - Continue with cetirizine '10mg'$  daily. - Continue with your nose spray as needed.  - We may consider skin testing in the future.   2. Flexural atopic dermatitis - We will take over your Dupixent. - Tammy will reach out to discuss the re-approval process.  - We are happy to take this over for you. - Tammy will reach out to discuss this more.   3. Allergic contact dermatitis  - We will schedule you for patch testing.  - This takes place on Monday and then they are read on Wednesday and Friday.  - We will test for all of our metals.   4. Return in about 5 days (around 04/27/2022).    Please inform us of any Emergency Department visits, hospitalizations, or changes in symptoms. Call us before going to the ED for breathing or allergy symptoms since we might be able to fit you in for a sick visit. Feel free to contact us anytime with any questions, problems, or concerns.  It was a pleasure to meet you today!  Websites that have reliable patient information: 1. American Academy of Asthma, Allergy, and Immunology: www.aaaai.org 2. Food Allergy Research and Education (FARE): foodallergy.org 3. Mothers of Asthmatics: http://www.asthmacommunitynetwork.org 4. American College of Allergy, Asthma, and Immunology: www.acaai.org   COVID-19 Vaccine Information can be found at: ShippingScam.co.uk For questions related to vaccine distribution or appointments, please email [email or web address] or call [phone].   We realize that you might be concerned about having an allergic reaction to the COVID19 vaccines. To help with that concern, WE ARE OFFERING THE COVID19 VACCINES IN OUR OFFICE! Ask the front desk for dates!     "Like" [place] on Facebook and Instagram for our latest updates!      A healthy democracy works  best when [employer] participate! Make sure you are registered to vote! If you have moved or changed any of your contact information, you will need to get this updated before voting!  In some cases, you MAY be able to register to vote online: [email or web address]

## 2022-04-22 NOTE — Progress Notes (Signed)
NEW PATIENT  Date of Service/Encounter:  04/22/22  Consult requested by: Julie Spikes, DO   Assessment:   Seasonal allergic rhinitis due to pollen - ordering environmental allergy panel your blood  Flexural atopic dermatitis - well controlled on Dupixent  Allergic contact dermatitis - getting patch testing for metals  Plan/Recommendations:   1. Seasonal allergic rhinitis due to pollen - Let's start with blood work for environmental allergy testing via the blood. - Continue with cetirizine '10mg'$  daily. - Continue with your nose spray as needed.  - We may consider skin testing in the future.   2. Flexural atopic dermatitis - We will take over your Dupixent. - Julie Brown will reach out to discuss the re-approval process.  - We are happy to take this over for you. - Julie Brown will reach out to discuss this more.   3. Allergic contact dermatitis  - We will schedule you for patch testing.  - This takes place on Monday and then they are read on Wednesday and Friday.  - We will test for all of our metals.   4. Return in about 5 days (around 04/27/2022).     This note in its entirety was forwarded to the Provider who requested this consultation.  Subjective:   Julie Brown is a 71 y.o. female presenting today for evaluation of  Chief Complaint  Patient presents with   Allergy Testing    Metal patch testing  - right knee replacement - possible nickel allergy     Julie Brown has a history of the following: Patient Active Problem List   Diagnosis Date Noted   Acute frontal sinusitis 46/96/2952   Lichen sclerosus et atrophicus of the vulva 04/04/2021   Obesity (BMI 35.0-39.9 without comorbidity) 01/16/2021   Hyperlipidemia LDL goal <130 01/16/2021   Seasonal allergic rhinitis due to pollen 10/22/2020   Subclinical hypothyroidism 06/17/2020   History of colonic polyps    FH: colon cancer in relative <26 years old 12/03/2015   Hepatic steatosis 02/12/2009    History  obtained from: chart review and patient.  Julie Brown was referred by Julie Brown     Julie Brown is a 71 y.o. female presenting for an evaluation of possible metal allergy .  She is going to be getting a knee replacement. She had an injury where her dog jumped on her. She was in the bedroom with her dog and her dog jumped off the bed and hit her knee. This was nearly 5 years ago. She had a meniscus repair and she had the gel placed into her joint cavity. But then it stopped working which is why she is here. She will likely need both replaced.   She has a long standing history of reactions to "junk jewelery". So when they asked whether she had allergies, she mentioned this reaction she was referred over here. She has no autoimmune history.  She has never been patch tested.  She has never had any joints replaced.  Allergic Rhinitis Symptom History: She was tested around 25 years ago. She was allergic to everything outdoors. She was never on shots. She reports that she itches "all of the time". She has a patch on her back. She takes a generic Zyrtec right now. She takes one daily.   Skin Symptom History: She is on Dupixent for her eczema. She follows with Julie Brown. She does not have a fluocinolone for her skin but she does not use it much at all.  She has a cream and an oil for her body and scalp respectively. She has hydrocortisone as well. She does not need refills at this time. She needs a refill for her Dupixent. She gets her medication from Julie Brown.  She previously followed with Julie Brown, the Dermatologist here in town, but she did not like him.  Otherwise, there is no history of other atopic diseases, including asthma, food allergies, drug allergies, stinging insect allergies, or urticaria. There is no significant infectious history. Vaccinations are up to date.    Past Medical History: Patient Active Problem List   Diagnosis Date Noted   Acute frontal sinusitis 71/12/2692   Lichen  sclerosus et atrophicus of the vulva 04/04/2021   Obesity (BMI 35.0-39.9 without comorbidity) 01/16/2021   Hyperlipidemia LDL goal <130 01/16/2021   Seasonal allergic rhinitis due to pollen 10/22/2020   Subclinical hypothyroidism 06/17/2020   History of colonic polyps    FH: colon cancer in relative <81 years old 12/03/2015   Hepatic steatosis 02/12/2009    Medication List:  Allergies as of 04/22/2022       Reactions   Codeine Nausea And Vomiting, Other (See Comments)   Hydrocodone Nausea And Vomiting        Medication List        Accurate as of April 22, 2022  4:29 PM. If you have any questions, ask your nurse or doctor.          amoxicillin-clavulanate 875-125 MG tablet Commonly known as: AUGMENTIN Take 1 tablet by mouth 2 (two) times daily.   Dupixent 300 MG/2ML Sopn Generic drug: Dupilumab Inject 300 mg into the skin every 14 (fourteen) days.   fluocinonide 0.05 % external solution Commonly known as: LIDEX APPLY  SOLUTION TOPICALLY ONCE DAILY TO RASH   Fluocinonide 0.1 % Crea APPLY ONCE DAILY TO AFFECTED AREA AS NEEDED FOR UP TO 2 WEEKS AT A TIME   furosemide 20 MG tablet Commonly known as: LASIX Take 1 tablet by mouth once daily   hydrocortisone 2.5 % rectal cream Commonly known as: ANUSOL-HC Place 1 application rectally 2 (two) times daily. What changed:  when to take this reasons to take this   ONE-A-DAY WOMENS PO Take 1 tablet by mouth daily.   potassium chloride 10 MEQ tablet Commonly known as: KLOR-CON TAKE 1  BY MOUTH ONCE DAILY   Premarin vaginal cream Generic drug: conjugated estrogens Place vaginally 2 (two) times a week. Pea-sized (0.5g) in vagina   rosuvastatin 10 MG tablet Commonly known as: Crestor Take 1 tablet (10 mg total) by mouth daily.   SUPER B-COMPLEX PO Take 1 tablet by mouth daily.   triamcinolone cream 0.1 % Commonly known as: KENALOG Apply 1 application topically daily as needed (eczema).   ZINC PO Take 50  mg by mouth daily.        Birth History: non-contributory  Developmental History: non-contributory  Past Surgical History: Past Surgical History:  Procedure Laterality Date   BIOPSY  08/15/2020   Procedure: BIOPSY;  Surgeon: Julie Dolin, MD;  Location: [facility];  Service: Endoscopy;;   BREAST SURGERY     lump removed   CATARACT EXTRACTION W/PHACO Right 06/02/2021   Procedure: CATARACT EXTRACTION PHACO AND INTRAOCULAR LENS PLACEMENT RIGHT EYE;  Surgeon: Baruch Goldmann, MD;  Location: [facility];  Service: Ophthalmology;  Laterality: Right;  right CDE=8.77   CATARACT EXTRACTION W/PHACO Left 06/16/2021   Procedure: CATARACT EXTRACTION LEFT EYE W/ PHACO AND INTRAOCULAR LENS PLACEMENT (IOC);  Surgeon: Marisa Hua,  Jeneen Rinks, MD;  Location: [facility];  Service: Ophthalmology;  Laterality: Left;  CDE: 6.86    CHOLECYSTECTOMY  01/06/2012   Procedure: LAPAROSCOPIC CHOLECYSTECTOMY;  Surgeon: Donato Heinz, MD;  Location: [facility];  Service: General;  Laterality: N/A;   COLONOSCOPY  07/22/06   NOB:SJGGEZ and colon polyps/left-sided diverticula. adenomatous colon polyps. surveillance TCS due 2013   COLONOSCOPY N/A 12/25/2015   sigmoid diverticulosis, multiple colonic polyps removed, s/p segmental colonic biopsies. Tubular adenomas, negative microscopic colitis. Surveillance 2022.    COLONOSCOPY WITH PROPOFOL N/A 08/15/2020   Sigmoid and descending colon diverticulosis, s/p segmental biopsy. 5 year colonoscopy. Biopsy without microscopic colitis.    ESOPHAGOGASTRODUODENOSCOPY  07/22/06   MOQ:HUTMLY/YTKPT HH otherwise normal   FOOT SURGERY     left foot   MENISCUS REPAIR Right 12/28/2017   TUBAL LIGATION       Family History: Family History  Problem Relation Age of Onset   Colon cancer Mother        age 76   Kidney cancer Mother        cause of death, 56 cancer surgeries, died at age 11   Diabetes Brother    Hypertension Brother    Cancer Brother      Social History: Terrina lives at home  with her husband.  They live in a house that is 59 years old.  There is laminate throughout the home.  They have electric heating and central cooling.  There is 1 dog inside of the home.  There are no dust mite covers on the bedding.  There is no tobacco exposure.  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  There is no HEPA filter in her home.  She is not exposed to fumes, chemicals, or dust.  She does not live near an interstate or industrial area.   Review of Systems  Constitutional: Negative.  Negative for fever, malaise/fatigue and weight loss.  HENT: Negative.  Negative for congestion, ear discharge and ear pain.   Eyes:  Negative for pain, discharge and redness.  Respiratory:  Negative for cough, sputum production, shortness of breath and wheezing.   Cardiovascular: Negative.  Negative for chest pain and palpitations.  Gastrointestinal:  Negative for abdominal pain, constipation, diarrhea, heartburn, nausea and vomiting.  Skin:  Positive for itching and rash.  Neurological:  Negative for dizziness and headaches.  Endo/Heme/Allergies:  Negative for environmental allergies. Does not bruise/bleed easily.       Objective:   Blood pressure 128/80, pulse 94, temperature 98.2 F (36.8 C), resp. rate 18, height 5' 3.75" (1.619 m), weight 217 lb 3.2 oz (98.5 kg), SpO2 95 %. Body mass index is 37.58 kg/m.     Physical Exam Constitutional:      Appearance: She is well-developed.  HENT:     Head: Normocephalic and atraumatic.     Right Ear: Tympanic membrane, ear canal and external ear normal. No drainage, swelling or tenderness. Tympanic membrane is not injected, scarred, erythematous, retracted or bulging.     Left Ear: Tympanic membrane, ear canal and external ear normal. No drainage, swelling or tenderness. Tympanic membrane is not injected, scarred, erythematous, retracted or bulging.     Nose: No nasal deformity, septal deviation, mucosal edema or rhinorrhea.      Right Turbinates: Enlarged, swollen and pale.     Left Turbinates: Enlarged, swollen and pale.     Right Sinus: No maxillary sinus tenderness or frontal sinus tenderness.     Left Sinus:  No maxillary sinus tenderness or frontal sinus tenderness.     Mouth/Throat:     Mouth: Mucous membranes are not pale and not dry.     Pharynx: Uvula midline.  Eyes:     General:        Right eye: No discharge.        Left eye: No discharge.     Conjunctiva/sclera: Conjunctivae normal.     Right eye: Right conjunctiva is not injected. No chemosis.    Left eye: Left conjunctiva is not injected. No chemosis.    Pupils: Pupils are equal, Brown, and reactive to light.  Cardiovascular:     Rate and Rhythm: Normal rate and regular rhythm.     Heart sounds: Normal heart sounds.  Pulmonary:     Effort: Pulmonary effort is normal. No tachypnea, accessory muscle usage or respiratory distress.     Breath sounds: Normal breath sounds. No wheezing, rhonchi or rales.  Chest:     Chest wall: No tenderness.  Abdominal:     Tenderness: There is no abdominal tenderness. There is no guarding or rebound.  Lymphadenopathy:     Head:     Right side of head: No submandibular, tonsillar or occipital adenopathy.     Left side of head: No submandibular, tonsillar or occipital adenopathy.     Cervical: No cervical adenopathy.  Skin:    General: Skin is warm.     Capillary Refill: Capillary refill takes less than 2 seconds.     Coloration: Skin is not pale.     Findings: Rash present. No abrasion, erythema or petechiae. Rash is not papular, urticarial or vesicular.     Comments: Eczema is under fairly good control.  She does have some breakthrough patches on her bilateral antecubital fossa.  Neurological:     Mental Status: She is alert.      Diagnostic studies: none (we we will schedule her for patch testing)          Salvatore Marvel, MD Allergy and [place] [facility]

## 2022-04-23 DIAGNOSIS — J301 Allergic rhinitis due to pollen: Secondary | ICD-10-CM | POA: Diagnosis not present

## 2022-04-24 DIAGNOSIS — M1711 Unilateral primary osteoarthritis, right knee: Secondary | ICD-10-CM | POA: Diagnosis not present

## 2022-04-26 LAB — ALLERGENS W/COMP RFLX AREA 2
Alternaria Alternata IgE: 0.27 kU/L — AB
Aspergillus Fumigatus IgE: <0.1 kU/L
Bermuda Grass IgE: 0.57 kU/L — AB
Cedar, Mountain IgE: <0.1 kU/L
Cladosporium Herbarum IgE: <0.1 kU/L
Cockroach, German IgE: <0.1 kU/L
Common Silver Birch IgE: <0.1 kU/L
Cottonwood IgE: <0.1 kU/L
D Farinae IgE: <0.1 kU/L
D Pteronyssinus IgE: <0.1 kU/L
E001-IgE Cat Dander: <0.1 kU/L
E005-IgE Dog Dander: <0.1 kU/L
Elm, American IgE: <0.1 kU/L
IgE (Immunoglobulin E), Serum: 6 IU/mL (ref 6–495)
Johnson Grass IgE: 0.25 kU/L — AB
Maple/Box Elder IgE: <0.1 kU/L
Mouse Urine IgE: <0.1 kU/L
Oak, White IgE: <0.1 kU/L
Pecan, Hickory IgE: <0.1 kU/L
Penicillium Chrysogen IgE: <0.1 kU/L
Pigweed, Rough IgE: <0.1 kU/L
Ragweed, Short IgE: <0.1 kU/L
Sheep Sorrel IgE Qn: <0.1 kU/L
Timothy Grass IgE: 0.31 kU/L — AB
White Mulberry IgE: <0.1 kU/L

## 2022-04-27 ENCOUNTER — Ambulatory Visit (INDEPENDENT_AMBULATORY_CARE_PROVIDER_SITE_OTHER): Payer: Medicare HMO | Admitting: Internal Medicine

## 2022-04-27 ENCOUNTER — Encounter: Payer: Self-pay | Admitting: Internal Medicine

## 2022-04-27 VITALS — BP 130/88 | HR 97 | Temp 97.2°F | Resp 16

## 2022-04-27 DIAGNOSIS — L2089 Other atopic dermatitis: Secondary | ICD-10-CM

## 2022-04-27 DIAGNOSIS — L230 Allergic contact dermatitis due to metals: Secondary | ICD-10-CM

## 2022-04-27 NOTE — Progress Notes (Signed)
   FOLLOW UP Date of Service/Encounter:  04/27/22   Subjective:  Julie Brown (DOB: [birth year]) is a 71 y.o. female who returns to the [facility] on 04/27/2022 for follow up for patch testing placement.  History obtained from: chart review and patient.  Seen 04/22/2022 with Dr. Ernst Bowler.  She has a history of reactions to jewelry and is planning to undergo knee replacement.  She has never had patch testing performed prior.  No prednisone or UV exposure.  She has had a mild sinus infection requiring antibiotics.    Past Medical History: Past Medical History:  Diagnosis Date   Hayfever    Hyperlipidemia    Migraine headache    PONV (postoperative nausea and vomiting)     Objective:  BP 130/88   Pulse 97   Temp (!) 97.2 F (36.2 C)   Resp 16   SpO2 97%  There is no height or weight on file to calculate BMI. Physical Exam: GEN: alert, well developed HEENT: clear conjunctiva, moist oral mucosa HEART: regular rate and rhythm, no murmur LUNGS: clear to auscultation bilaterally, no coughing, unlabored respiration SKIN: no rashes or lesions  Diagnostics: metal patches placed.   Assessment/Plan  Contact Dermatitis to metals Plan:   Allergic contact dermatitis - Instructions provided on care of the patches for the next 48 hours. - Was instructed to avoid showering for the next 48 hours. - Will follow up in 48 hours and 96 hours for patch readings.   Harlon Flor, MD  Allergy and [place] [facility] [place]

## 2022-04-29 ENCOUNTER — Encounter: Payer: Self-pay | Admitting: Family

## 2022-04-29 ENCOUNTER — Ambulatory Visit: Payer: Medicare HMO | Admitting: Family

## 2022-04-29 DIAGNOSIS — L230 Allergic contact dermatitis due to metals: Secondary | ICD-10-CM

## 2022-04-29 NOTE — Progress Notes (Signed)
Avielle returns to the office today for the final patch test interpretation, given suspected history of contact dermatitis.    Diagnostics:   Metal patch test 48-hour hour reading: all negative   Plan:   Allergic contact dermatitis - return for 96 hour patch reading -will send your Dupixent information that you have given [place] to Tammy so we can help you with the transfer.   Althea Charon, FNP [facility]

## 2022-05-01 ENCOUNTER — Encounter: Payer: Self-pay | Admitting: Allergy & Immunology

## 2022-05-01 ENCOUNTER — Ambulatory Visit: Payer: Medicare HMO | Admitting: Allergy & Immunology

## 2022-05-01 VITALS — BP 132/68 | HR 73 | Temp 98.2°F | Resp 16

## 2022-05-01 DIAGNOSIS — L230 Allergic contact dermatitis due to metals: Secondary | ICD-10-CM

## 2022-05-01 NOTE — Progress Notes (Signed)
    Follow-up Note  RE: Julie Brown MRN: [id number] DOB: 1951/06/03 Date of Office Visit: 05/01/2022  Primary care provider: Coral Spikes, DO Referring provider: Ronald Pippins   Julie Brown returns to the office today for the final patch test interpretation, given suspected history of contact dermatitis and in anticipation of a joint replacement.   Diagnostics:   Metal series 96-hour reading: Negative to the entire panel including chromium, cobalt, molybdenum, vantal, nickel, potassium, copper, titanium, and magnesium as well as aluminum and vanadium.   Plan:   Allergic contact dermatitis - Julie Brown low risk for development of contact dermatitis secondary to artificial joint placement. - These results will be sent to her orthopedic surgeon, Dr. Hector Shade.  - Some items can present late, but these are mostly the steroids. - I did ask Julie Brown to call us if anything changes over the weekend and have her husband take pictures.   Salvatore Marvel, MD  Allergy and [facility] [place]

## 2022-05-05 ENCOUNTER — Ambulatory Visit (INDEPENDENT_AMBULATORY_CARE_PROVIDER_SITE_OTHER): Payer: Medicare HMO | Admitting: Family Medicine

## 2022-05-05 ENCOUNTER — Encounter: Payer: Self-pay | Admitting: Family Medicine

## 2022-05-05 DIAGNOSIS — Z0189 Encounter for other specified special examinations: Secondary | ICD-10-CM | POA: Diagnosis not present

## 2022-05-05 DIAGNOSIS — Z01818 Encounter for other preprocedural examination: Secondary | ICD-10-CM | POA: Diagnosis not present

## 2022-05-05 DIAGNOSIS — Z01812 Encounter for preprocedural laboratory examination: Secondary | ICD-10-CM | POA: Diagnosis not present

## 2022-05-05 NOTE — Telephone Encounter (Signed)
Immunizations updated in EPIC

## 2022-05-05 NOTE — Patient Instructions (Signed)
Good luck on the surgery.  We will fax today.  Take care  Dr. Lacinda Axon

## 2022-05-06 ENCOUNTER — Telehealth: Payer: Self-pay | Admitting: *Deleted

## 2022-05-06 DIAGNOSIS — Z01818 Encounter for other preprocedural examination: Secondary | ICD-10-CM | POA: Insufficient documentation

## 2022-05-06 NOTE — Assessment & Plan Note (Addendum)
Doing well.  Low surgical risk. Form filled out for surgery.  Medications reviewed today. She will hold medications prior to surgery (excluding potassium).

## 2022-05-06 NOTE — Telephone Encounter (Signed)
-----   Message from Valentina Shaggy, MD sent at 05/01/2022 10:31 AM EDT ----- I told her to expect a call from you! She is pleased as punch with our practice! She brought [place] breakfast this morning - sorry we cannot share!

## 2022-05-06 NOTE — Telephone Encounter (Signed)
Spoke to patient and advised patient of PAP renewal

## 2022-05-06 NOTE — Telephone Encounter (Signed)
L/m for patient to contact me regarding her PAP. She should be getting info in mail from [place] [employer] to fill out and send back fro 2024 PAP coming up

## 2022-05-06 NOTE — Telephone Encounter (Signed)
Thanks Tammy!   Salvatore Marvel, MD Allergy and [facility] [place]

## 2022-05-06 NOTE — Progress Notes (Signed)
Subjective:  Patient ID: Julie Brown, female    DOB: Oct 21, 1950  Age: 71 y.o. MRN: [id number]  CC: Chief Complaint  Patient presents with   SURGICAL CLEARANCE    Pt having right knee replacement on 05/27/22.     HPI:  71 year old female with HLD, Obesity, Hepatic steatosis presents for surgical clearance.   Has upcoming surgery planned - Right knee replacement ([facility]; Dr. Wynelle Link).  Doing well currently. No chest pain or SOB. Per documentation, [facility] will be ordering labs prior to surgery.  Needs risk assessment and form to be filled out. No other complaints at this time.  Patient Active Problem List   Diagnosis Date Noted   Preoperative examination 74/25/9563   Lichen sclerosus et atrophicus of the vulva 04/04/2021   Obesity (BMI 35.0-39.9 without comorbidity) 01/16/2021   Hyperlipidemia LDL goal <130 01/16/2021   Seasonal allergic rhinitis due to pollen 10/22/2020   Subclinical hypothyroidism 06/17/2020   History of colonic polyps    FH: colon cancer in relative <79 years old 12/03/2015   Hepatic steatosis 02/12/2009    Social Hx   Social History   Socioeconomic History   Marital status: Married    Spouse name: Not on file   Number of children: 1   Years of education: Not on file   Highest education level: Not on file  Occupational History   Occupation: bookeeping   Tobacco Use   Smoking status: Former   Smokeless tobacco: Never   Tobacco comments:    quit 1990  Vaping Use   Vaping Use: Never used  Substance and Sexual Activity   Alcohol use: Not Currently    Alcohol/week: 0.0 standard drinks of alcohol    Comment: occasional, not daily   Drug use: No   Sexual activity: Not Currently    Birth control/protection: Post-menopausal  Other Topics Concern   Not on file  Social History Narrative   Not on file   Social Determinants of Health   Financial Resource Strain: Not on file  Food Insecurity: Not on file  Transportation Needs: Not on file   Physical Activity: Not on file  Stress: Not on file  Social Connections: Not on file    Review of Systems Per HPI  Objective:  BP 122/84   Pulse 76   Wt 217 lb 3.2 oz (98.5 kg)   SpO2 98%   BMI 37.58 kg/m      05/05/2022   10:12 AM 05/01/2022   10:01 AM 04/27/2022    8:51 AM  BP/Weight  Systolic BP 875 643 329  Diastolic BP 84 68 88  Wt. (Lbs) 217.2    BMI 37.58 kg/m2      Physical Exam Vitals and nursing note reviewed.  Constitutional:      Appearance: Normal appearance. She is obese.  HENT:     Head: Normocephalic and atraumatic.  Eyes:     General:        Right eye: No discharge.        Left eye: No discharge.     Conjunctiva/sclera: Conjunctivae normal.  Cardiovascular:     Rate and Rhythm: Normal rate and regular rhythm.  Pulmonary:     Effort: Pulmonary effort is normal.     Breath sounds: Normal breath sounds. No wheezing, rhonchi or rales.  Neurological:     Mental Status: She is alert.  Psychiatric:        Mood and Affect: Mood normal.  Behavior: Behavior normal.     Lab Results  Component Value Date   WBC 5.4 11/26/2021   HGB 14.8 11/26/2021   HCT 43.8 11/26/2021   PLT 254 11/26/2021   GLUCOSE 104 (H) 11/26/2021   CHOL 240 (H) 11/26/2021   TRIG 146 11/26/2021   HDL 62 11/26/2021   LDLCALC 152 (H) 11/26/2021   ALT 22 11/26/2021   AST 17 11/26/2021   NA 141 11/26/2021   K 4.3 11/26/2021   CL 105 11/26/2021   CREATININE 0.82 11/26/2021   BUN 17 11/26/2021   CO2 22 11/26/2021   TSH 7.120 (H) 06/10/2020   HGBA1C 5.2 11/26/2021     Assessment & Plan:   Problem List Items Addressed This Visit       Other   Preoperative examination    Doing well.  Low surgical risk. Form filled out for surgery.  Medications reviewed today. She will hold medications prior to surgery (excluding potassium).        [place]

## 2022-05-07 ENCOUNTER — Encounter: Payer: Self-pay | Admitting: Family Medicine

## 2022-05-08 ENCOUNTER — Ambulatory Visit (INDEPENDENT_AMBULATORY_CARE_PROVIDER_SITE_OTHER): Payer: Medicare HMO | Admitting: Nurse Practitioner

## 2022-05-08 VITALS — BP 115/74 | HR 74 | Temp 98.4°F | Wt 218.0 lb

## 2022-05-08 DIAGNOSIS — J0110 Acute frontal sinusitis, unspecified: Secondary | ICD-10-CM | POA: Diagnosis not present

## 2022-05-08 MED ORDER — METHYLPREDNISOLONE ACETATE 40 MG/ML IJ SUSP
40.0000 mg | Freq: Once | INTRAMUSCULAR | Status: AC
  Administered 2022-05-08: 40 mg via INTRAMUSCULAR

## 2022-05-08 NOTE — Progress Notes (Unsigned)
   Subjective:    Patient ID: Julie Brown, female    DOB: 21-Jul-1950, 71 y.o.   MRN: [id number]  HPI Laryngitis has returned, patient completed 2 diff ABX last week for respiratory illness, pt has upcoming R knee surgery coming up and needs clear symptoms prior to proceeding  HPI: Episodic visit for continued sinus congestion. Reports history of seasonal allergies. Today presents with frontal sinuses tenderness with sinus headaches unrelieved by 2 antibiotic courses. Symptoms worse than other years and include Laryngitis x > 3 wks. Mild ear pain. Mild sinus headaches. Completed 10 day course of Augmentin followed by 3 days of improvement. Symptoms returned and was given 7 day course of  Doxycycline. Symptoms continue to persist. Returning today hoping to resolve all illness before knee procedure scheduled for Nov 22. Reports needs knee procedure due to ambulation instability and pain. Denies reflux symptoms. Admits to taking Flonase intermittently. Requesting allergy injection.   Review of Systems  Constitutional:  Positive for fatigue. Negative for chills and fever.  HENT:  Positive for congestion, rhinorrhea, sinus pressure, sinus pain and voice change. Negative for ear pain, sore throat and trouble swallowing.   Respiratory:  Negative for cough and wheezing.   Gastrointestinal:  Negative for diarrhea, nausea and vomiting.  Neurological:  Positive for headaches. Negative for dizziness and light-headedness.      Objective:    Vitals:   05/08/22 1327  BP: 115/74  Pulse: 74  Temp: 98.4 F (36.9 C)  SpO2: 98%   Physical Exam Constitutional:      General: She is not in acute distress.    Appearance: Normal appearance. She is ill-appearing.  HENT:     Right Ear: Tympanic membrane normal.     Ears:     Comments: Mild retraction noted in R TM. No erythema.     Nose: Congestion present.     Comments: Mildly erythemetous mucosa.     Mouth/Throat:     Pharynx: Posterior oropharyngeal  erythema present. No oropharyngeal exudate.  Eyes:     Comments: Mildly injected sclera. Visible BIL allergic shiners.  Cardiovascular:     Heart sounds: Normal heart sounds.     No gallop.  Pulmonary:     Effort: No respiratory distress.     Breath sounds: Normal breath sounds. No wheezing or rhonchi.  Musculoskeletal:     Cervical back: No tenderness.  Skin:    General: Skin is warm and dry.  Neurological:     Mental Status: She is oriented to person, place, and time.       Assessment & Plan:   Plan: Encouraged continued and consistent regimented use of flonase with correct demonstration of and understanding of proper self-administration verbalized. Will send antibiotic into the [facility] no improvement over weekend. Mucinex samples given with medication instructions. Encouraged increased fluid intake. Depo-medrol steroid shot given in office today. Medication warning signs reviewed and patient verbalized understanding. Recheck if symptoms worsen or persist.

## 2022-05-10 ENCOUNTER — Encounter: Payer: Self-pay | Admitting: Nurse Practitioner

## 2022-05-10 NOTE — Progress Notes (Signed)
Patient ID: Julie Brown, female   DOB: Jul 26, 1950, 71 y.o.   MRN: [id number]

## 2022-05-11 ENCOUNTER — Other Ambulatory Visit: Payer: Self-pay | Admitting: Family Medicine

## 2022-05-11 ENCOUNTER — Encounter: Payer: Self-pay | Admitting: Nurse Practitioner

## 2022-05-11 MED ORDER — AMOXICILLIN-POT CLAVULANATE 875-125 MG PO TABS: 1.0000 | ORAL_TABLET | Freq: Two times a day (BID) | ORAL | 0 refills | Status: DC

## 2022-05-20 ENCOUNTER — Other Ambulatory Visit: Payer: Self-pay

## 2022-05-20 ENCOUNTER — Encounter: Payer: Self-pay | Admitting: Family Medicine

## 2022-05-20 DIAGNOSIS — J301 Allergic rhinitis due to pollen: Secondary | ICD-10-CM

## 2022-05-20 MED ORDER — FLUTICASONE PROPIONATE 50 MCG/ACT NA SUSP: 2.0000 | Freq: Every day | NASAL | 1 refills | Status: DC

## 2022-05-26 DIAGNOSIS — R269 Unspecified abnormalities of gait and mobility: Secondary | ICD-10-CM | POA: Insufficient documentation

## 2022-05-26 DIAGNOSIS — M25561 Pain in right knee: Secondary | ICD-10-CM | POA: Insufficient documentation

## 2022-05-26 DIAGNOSIS — M25661 Stiffness of right knee, not elsewhere classified: Secondary | ICD-10-CM | POA: Insufficient documentation

## 2022-05-27 DIAGNOSIS — M1711 Unilateral primary osteoarthritis, right knee: Secondary | ICD-10-CM | POA: Diagnosis not present

## 2022-05-27 DIAGNOSIS — G8918 Other acute postprocedural pain: Secondary | ICD-10-CM | POA: Diagnosis not present

## 2022-06-01 DIAGNOSIS — M25561 Pain in right knee: Secondary | ICD-10-CM | POA: Diagnosis not present

## 2022-06-01 DIAGNOSIS — M25661 Stiffness of right knee, not elsewhere classified: Secondary | ICD-10-CM | POA: Diagnosis not present

## 2022-06-01 DIAGNOSIS — R269 Unspecified abnormalities of gait and mobility: Secondary | ICD-10-CM | POA: Diagnosis not present

## 2022-06-04 DIAGNOSIS — M25561 Pain in right knee: Secondary | ICD-10-CM | POA: Diagnosis not present

## 2022-06-04 DIAGNOSIS — R269 Unspecified abnormalities of gait and mobility: Secondary | ICD-10-CM | POA: Diagnosis not present

## 2022-06-04 DIAGNOSIS — M25661 Stiffness of right knee, not elsewhere classified: Secondary | ICD-10-CM | POA: Diagnosis not present

## 2022-06-05 DIAGNOSIS — M25661 Stiffness of right knee, not elsewhere classified: Secondary | ICD-10-CM | POA: Diagnosis not present

## 2022-06-05 DIAGNOSIS — M25561 Pain in right knee: Secondary | ICD-10-CM | POA: Diagnosis not present

## 2022-06-05 DIAGNOSIS — R269 Unspecified abnormalities of gait and mobility: Secondary | ICD-10-CM | POA: Diagnosis not present

## 2022-06-09 DIAGNOSIS — R269 Unspecified abnormalities of gait and mobility: Secondary | ICD-10-CM | POA: Diagnosis not present

## 2022-06-09 DIAGNOSIS — M25561 Pain in right knee: Secondary | ICD-10-CM | POA: Diagnosis not present

## 2022-06-09 DIAGNOSIS — M25661 Stiffness of right knee, not elsewhere classified: Secondary | ICD-10-CM | POA: Diagnosis not present

## 2022-06-10 DIAGNOSIS — M25561 Pain in right knee: Secondary | ICD-10-CM | POA: Diagnosis not present

## 2022-06-10 DIAGNOSIS — M25661 Stiffness of right knee, not elsewhere classified: Secondary | ICD-10-CM | POA: Diagnosis not present

## 2022-06-10 DIAGNOSIS — R269 Unspecified abnormalities of gait and mobility: Secondary | ICD-10-CM | POA: Diagnosis not present

## 2022-06-12 DIAGNOSIS — M25661 Stiffness of right knee, not elsewhere classified: Secondary | ICD-10-CM | POA: Diagnosis not present

## 2022-06-12 DIAGNOSIS — M25561 Pain in right knee: Secondary | ICD-10-CM | POA: Diagnosis not present

## 2022-06-12 DIAGNOSIS — R269 Unspecified abnormalities of gait and mobility: Secondary | ICD-10-CM | POA: Diagnosis not present

## 2022-06-13 ENCOUNTER — Other Ambulatory Visit: Payer: Self-pay | Admitting: *Deleted

## 2022-06-13 DIAGNOSIS — L2084 Intrinsic (allergic) eczema: Secondary | ICD-10-CM

## 2022-06-13 MED ORDER — DUPIXENT 300 MG/2ML ~~LOC~~ SOAJ: 300.0000 mg | SUBCUTANEOUS | 3 refills | Status: DC

## 2022-06-15 DIAGNOSIS — M25661 Stiffness of right knee, not elsewhere classified: Secondary | ICD-10-CM | POA: Diagnosis not present

## 2022-06-15 DIAGNOSIS — M25561 Pain in right knee: Secondary | ICD-10-CM | POA: Diagnosis not present

## 2022-06-15 DIAGNOSIS — R269 Unspecified abnormalities of gait and mobility: Secondary | ICD-10-CM | POA: Diagnosis not present

## 2022-06-19 DIAGNOSIS — M25661 Stiffness of right knee, not elsewhere classified: Secondary | ICD-10-CM | POA: Diagnosis not present

## 2022-06-19 DIAGNOSIS — R269 Unspecified abnormalities of gait and mobility: Secondary | ICD-10-CM | POA: Diagnosis not present

## 2022-06-19 DIAGNOSIS — M25561 Pain in right knee: Secondary | ICD-10-CM | POA: Diagnosis not present

## 2022-06-23 ENCOUNTER — Other Ambulatory Visit: Payer: Self-pay | Admitting: Family Medicine

## 2022-06-23 DIAGNOSIS — M25561 Pain in right knee: Secondary | ICD-10-CM | POA: Diagnosis not present

## 2022-06-23 DIAGNOSIS — R269 Unspecified abnormalities of gait and mobility: Secondary | ICD-10-CM | POA: Diagnosis not present

## 2022-06-23 DIAGNOSIS — M25661 Stiffness of right knee, not elsewhere classified: Secondary | ICD-10-CM | POA: Diagnosis not present

## 2022-06-23 DIAGNOSIS — R609 Edema, unspecified: Secondary | ICD-10-CM

## 2022-06-24 DIAGNOSIS — R269 Unspecified abnormalities of gait and mobility: Secondary | ICD-10-CM | POA: Diagnosis not present

## 2022-06-24 DIAGNOSIS — M25661 Stiffness of right knee, not elsewhere classified: Secondary | ICD-10-CM | POA: Diagnosis not present

## 2022-06-24 DIAGNOSIS — M25561 Pain in right knee: Secondary | ICD-10-CM | POA: Diagnosis not present

## 2022-06-26 DIAGNOSIS — R269 Unspecified abnormalities of gait and mobility: Secondary | ICD-10-CM | POA: Diagnosis not present

## 2022-06-26 DIAGNOSIS — M25661 Stiffness of right knee, not elsewhere classified: Secondary | ICD-10-CM | POA: Diagnosis not present

## 2022-06-26 DIAGNOSIS — M25561 Pain in right knee: Secondary | ICD-10-CM | POA: Diagnosis not present

## 2022-07-01 DIAGNOSIS — M25561 Pain in right knee: Secondary | ICD-10-CM | POA: Diagnosis not present

## 2022-07-01 DIAGNOSIS — M25661 Stiffness of right knee, not elsewhere classified: Secondary | ICD-10-CM | POA: Diagnosis not present

## 2022-07-01 DIAGNOSIS — R269 Unspecified abnormalities of gait and mobility: Secondary | ICD-10-CM | POA: Diagnosis not present

## 2022-07-15 DIAGNOSIS — Z4889 Encounter for other specified surgical aftercare: Secondary | ICD-10-CM | POA: Diagnosis not present

## 2022-07-15 DIAGNOSIS — Z4789 Encounter for other orthopedic aftercare: Secondary | ICD-10-CM | POA: Diagnosis not present

## 2022-07-24 ENCOUNTER — Telehealth: Payer: Medicare HMO | Admitting: Family Medicine

## 2022-07-24 DIAGNOSIS — J069 Acute upper respiratory infection, unspecified: Secondary | ICD-10-CM

## 2022-07-24 MED ORDER — AZELASTINE HCL 0.1 % NA SOLN: 2.0000 | Freq: Two times a day (BID) | NASAL | 12 refills | Status: DC

## 2022-07-24 MED ORDER — BENZONATATE 100 MG PO CAPS: 100.0000 mg | ORAL_CAPSULE | Freq: Two times a day (BID) | ORAL | 0 refills | Status: DC | PRN

## 2022-07-24 NOTE — Progress Notes (Signed)
E-Visit for Upper Respiratory Infection   We are sorry you are not feeling well.  Here is how we plan to help!  Based on what you have shared with me, it looks like you may have a viral upper respiratory infection.  Upper respiratory infections are caused by a large number of viruses; however, rhinovirus is the most common cause.   Symptoms vary from person to person, with common symptoms including sore throat, cough, fatigue or lack of energy and feeling of general discomfort.  A low-grade fever of up to 100.4 may present, but is often uncommon.  Symptoms vary however, and are closely related to a person's age or underlying illnesses.  The most common symptoms associated with an upper respiratory infection are nasal discharge or congestion, cough, sneezing, headache and pressure in the ears and face.  These symptoms usually persist for about 3 to 10 days, but can last up to 2 weeks.  It is important to know that upper respiratory infections do not cause serious illness or complications in most cases.    Upper respiratory infections can be transmitted from person to person, with the most common method of transmission being a person's hands.  The virus is able to live on the skin and can infect other persons for up to 2 hours after direct contact.  Also, these can be transmitted when someone coughs or sneezes; thus, it is important to cover the mouth to reduce this risk.  To keep the spread of the illness at [place], good hand hygiene is very important.  This is an infection that is most likely caused by a virus. There are no specific treatments other than to help you with the symptoms until the infection runs its course.  We are sorry you are not feeling well.  Here is how we plan to help!   For nasal congestion, you may use an oral decongestants such as Mucinex D or if you have glaucoma or high blood pressure use plain Mucinex.  Saline nasal spray or nasal drops can help and can safely be used as often as  needed for congestion.  For your congestion, I have prescribed Azelastine nasal spray two sprays in each nostril twice a day to be used with your flonase.  If you do not have a history of heart disease, hypertension, diabetes or thyroid disease, prostate/bladder issues or glaucoma, you may also use Sudafed to treat nasal congestion.  It is highly recommended that you consult with a pharmacist or your primary care physician to ensure this medication is safe for you to take.     If you have a cough, you may use cough suppressants such as Delsym and Robitussin.  If you have glaucoma or high blood pressure, you can also use Coricidin HBP.   For cough I have prescribed for you A prescription cough medication called Tessalon Perles 100 mg. You may take 1-2 capsules every 8 hours as needed for cough  If you have a sore or scratchy throat, use a saltwater gargle-  to  teaspoon of salt dissolved in a 4-ounce to 8-ounce glass of warm water.  Gargle the solution for approximately 15-30 seconds and then spit.  It is important not to swallow the solution.  You can also use throat lozenges/cough drops and Chloraseptic spray to help with throat pain or discomfort.  Warm or cold liquids can also be helpful in relieving throat pain.  For headache, pain or general discomfort, you can use Ibuprofen or Tylenol as  directed.   Some authorities believe that zinc sprays or the use of Echinacea may shorten the course of your symptoms.   HOME CARE Only take medications as instructed by your medical team. Be sure to drink plenty of fluids. Water is fine as well as fruit juices, sodas and electrolyte beverages. You may want to stay away from caffeine or alcohol. If you are nauseated, try taking small sips of liquids. How do you know if you are getting enough fluid? Your urine should be a pale yellow or almost colorless. Get rest. Taking a steamy shower or using a humidifier may help nasal congestion and ease sore throat  pain. You can place a towel over your head and breathe in the steam from hot water coming from a faucet. Using a saline nasal spray works much the same way. Cough drops, hard candies and sore throat lozenges may ease your cough. Avoid close contacts especially the very young and the elderly Cover your mouth if you cough or sneeze Always remember to wash your hands.   GET HELP RIGHT AWAY IF: You develop worsening fever. If your symptoms do not improve within 10 days You develop yellow or green discharge from your nose over 3 days. You have coughing fits You develop a severe head ache or visual changes. You develop shortness of breath, difficulty breathing or start having chest pain Your symptoms persist after you have completed your treatment plan  MAKE SURE YOU  Understand these instructions. Will watch your condition. Will get help right away if you are not doing well or get worse.  Thank you for choosing an e-visit.  Your e-visit answers were reviewed by a board certified advanced clinical practitioner to complete your personal care plan. Depending upon the condition, your plan could have included both over the counter or prescription medications.  Please review your pharmacy choice. Make sure the pharmacy is open so you can pick up prescription now. If there is a problem, you may contact your provider through [employer] and have the prescription routed to another pharmacy.  Your safety is important to [place]. If you have drug allergies check your prescription carefully.   For the next 24 hours you can use MyChart to ask questions about today's visit, request a non-urgent call back, or ask for a work or school excuse. You will get an email in the next two days asking about your experience. I hope that your e-visit has been valuable and will speed your recovery.    I provided 5 minutes of non face-to-face time during this encounter for chart review, medication and order placement,  as well as and documentation.

## 2022-07-25 ENCOUNTER — Encounter: Payer: Self-pay | Admitting: Family Medicine

## 2022-07-26 IMAGING — MG MM DIGITAL SCREENING BILAT W/ TOMO AND CAD
8 series · 8 of 24 positions shown · non-contrast
Comparison: Previous exam(s).

CLINICAL DATA: Screening.

EXAM:
DIGITAL SCREENING BILATERAL MAMMOGRAM WITH TOMOSYNTHESIS AND CAD
TECHNIQUE: Bilateral screening digital craniocaudal and mediolateral oblique
mammograms were obtained. Bilateral screening digital breast
tomosynthesis was performed. The images were evaluated with
computer-aided detection.

[L CC synth-2D]
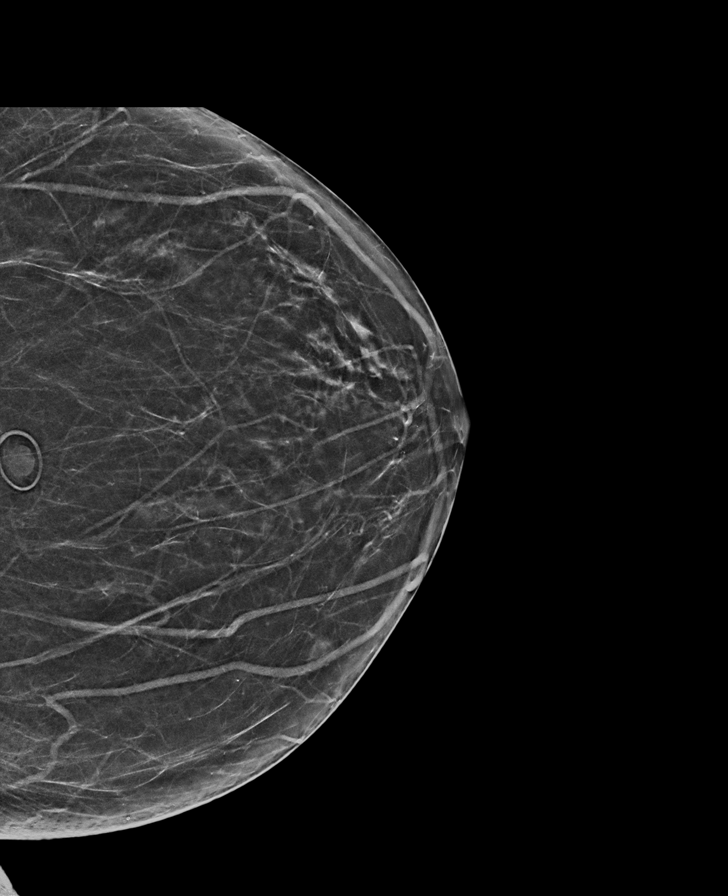

[L MLO synth-2D]
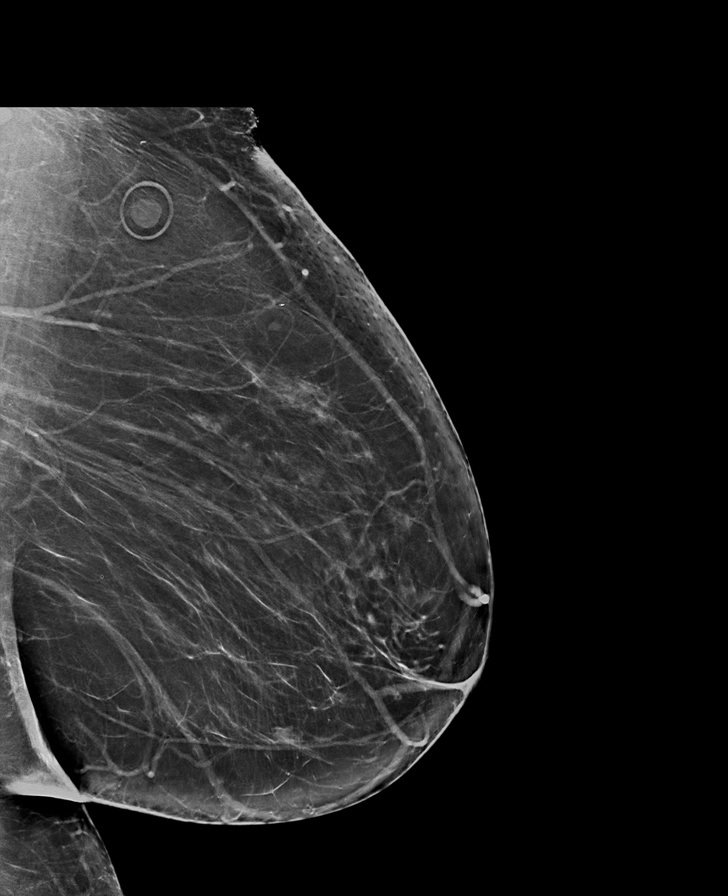

[R MLO synth-2D]
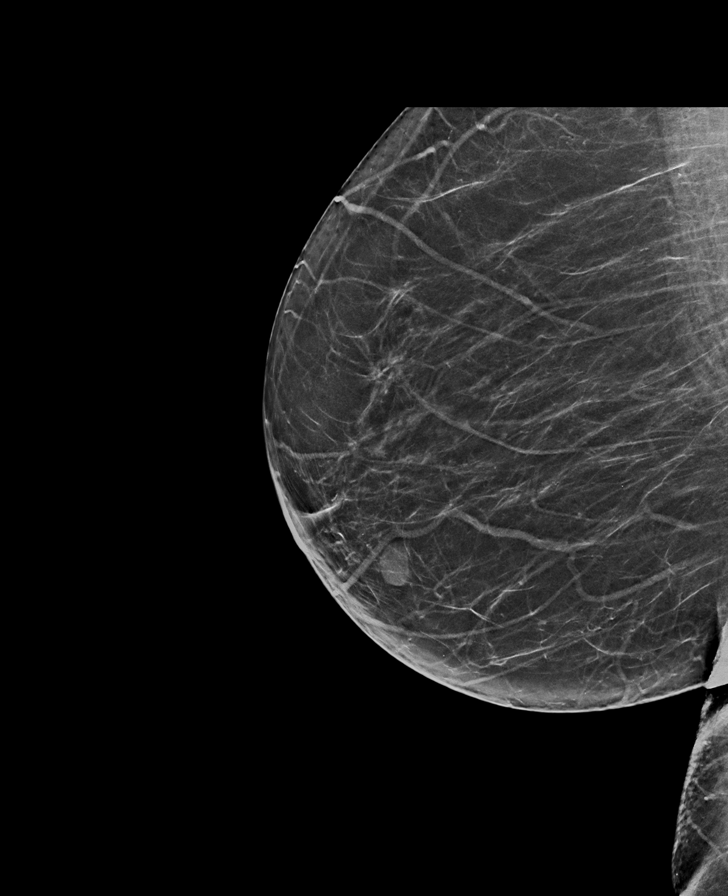

[R CC synth-2D]
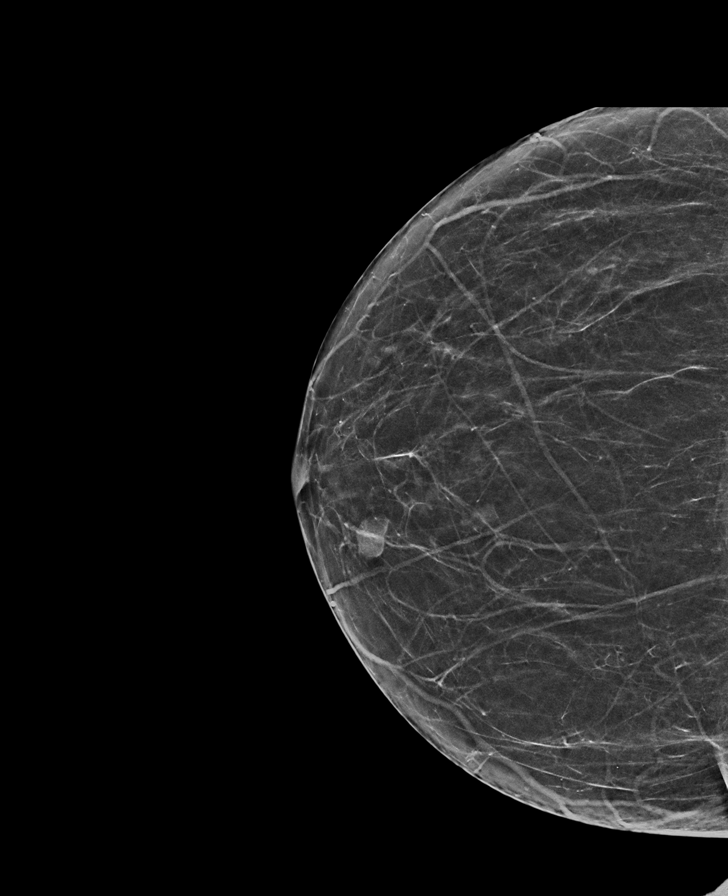

[R CC tomo · tomo slice 29/58.0]
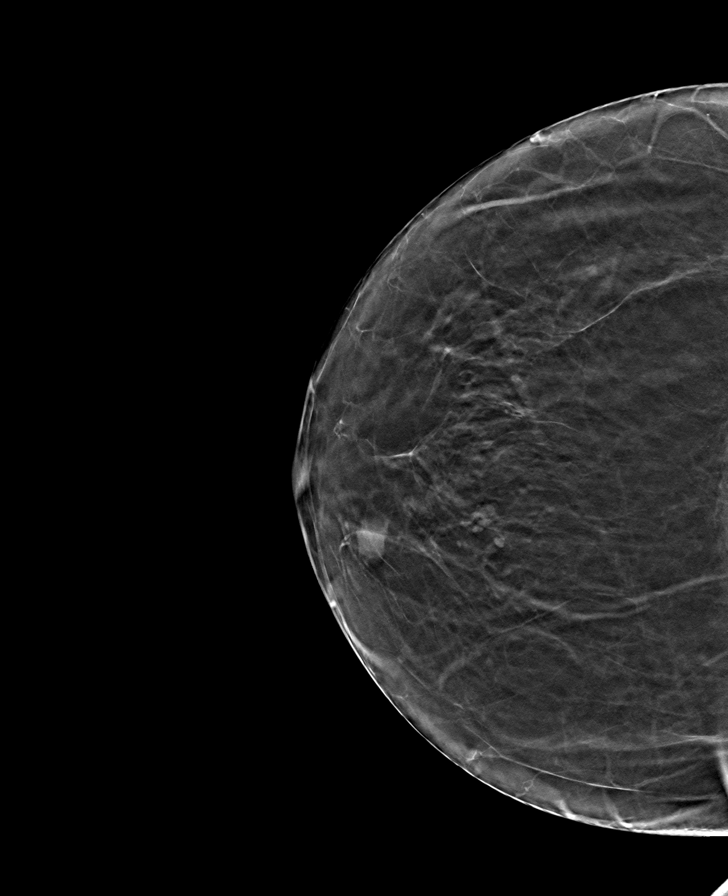

[R MLO tomo · tomo slice 33/66.0]
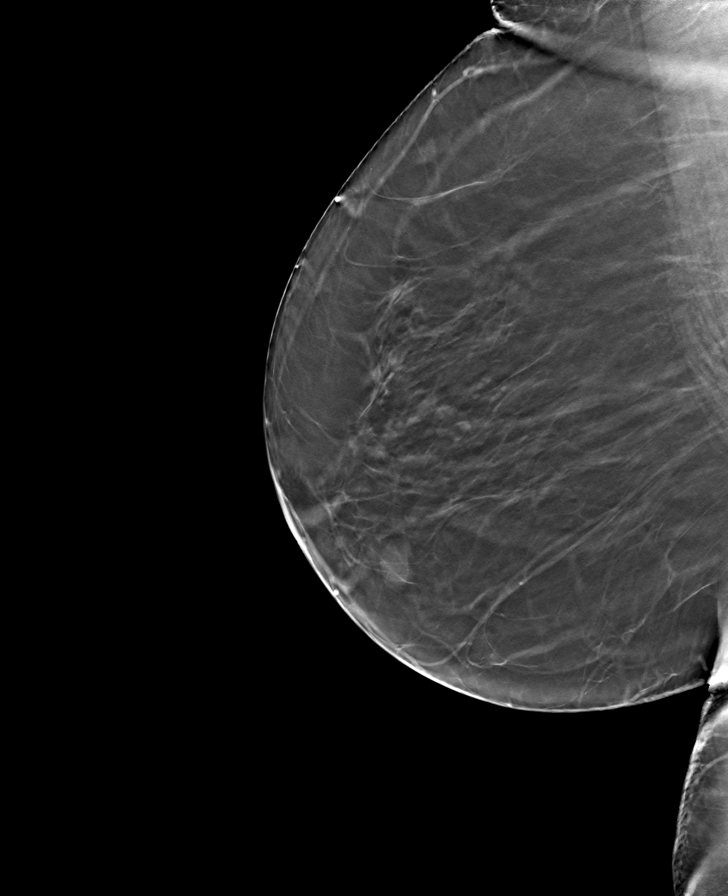

[L CC tomo · tomo slice 31/61.0]
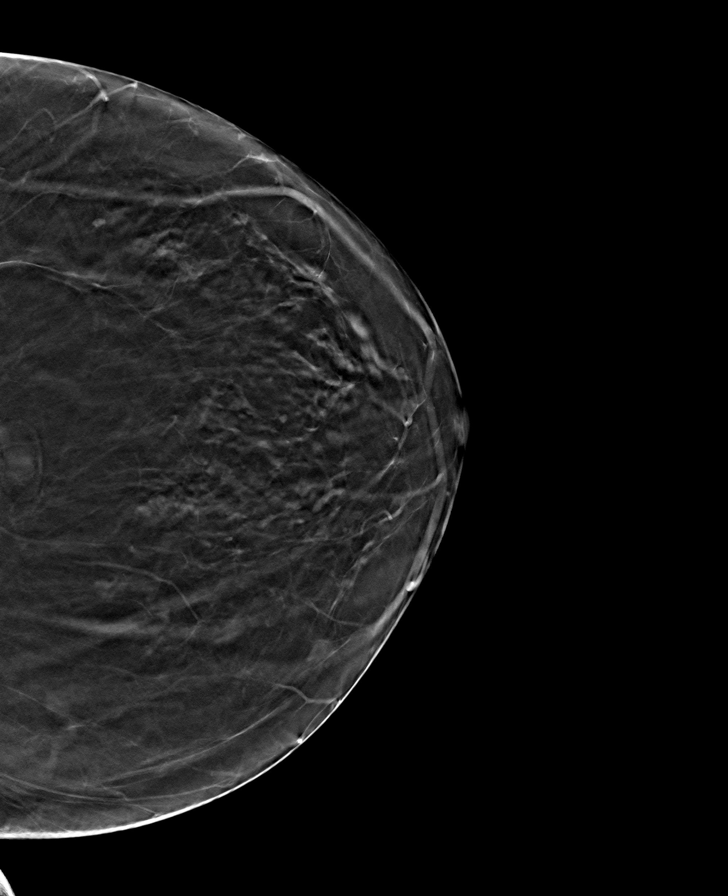

[L MLO tomo · tomo slice 41/80.0]
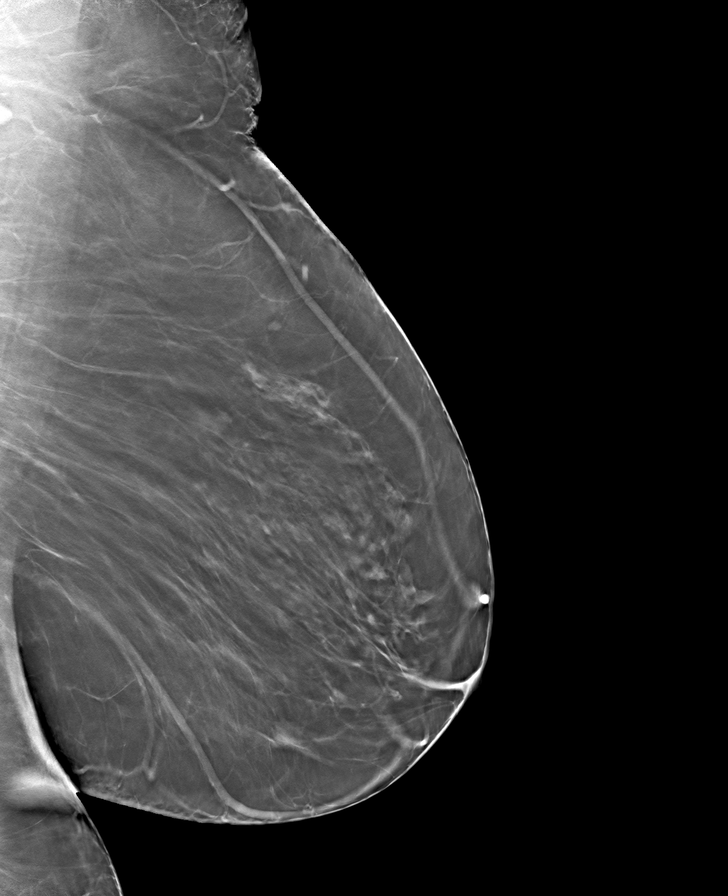

[8 of 24 positions shown; findings below may reference images not displayed]

ACR Breast Density Category b: There are scattered areas of
fibroglandular density.
FINDINGS: There are no findings suspicious for malignancy.
IMPRESSION: No mammographic evidence of malignancy. A result letter of this
screening mammogram will be mailed directly to the patient.

RECOMMENDATION:
Screening mammogram in one year. (Code:51-O-LD2)

BI-RADS CATEGORY  1: Negative.

## 2022-07-28 ENCOUNTER — Ambulatory Visit (INDEPENDENT_AMBULATORY_CARE_PROVIDER_SITE_OTHER): Payer: Medicare HMO | Admitting: Family Medicine

## 2022-07-28 ENCOUNTER — Encounter: Payer: Self-pay | Admitting: Family Medicine

## 2022-07-28 VITALS — BP 127/77 | HR 96 | Temp 97.9°F | Wt 213.8 lb

## 2022-07-28 DIAGNOSIS — J329 Chronic sinusitis, unspecified: Secondary | ICD-10-CM | POA: Diagnosis not present

## 2022-07-28 MED ORDER — DOXYCYCLINE HYCLATE 100 MG PO TABS: 100.0000 mg | ORAL_TABLET | Freq: Two times a day (BID) | ORAL | 0 refills | Status: DC

## 2022-07-28 NOTE — Assessment & Plan Note (Addendum)
Treating with doxycycline. Referral to ENT placed.

## 2022-07-28 NOTE — Progress Notes (Signed)
Subjective:  Patient ID: Julie Brown, female    DOB: 03/28/1951  Age: 72 y.o. MRN: [id number]  CC: Chief Complaint  Patient presents with   Cough    Cough, congestion, headache, diarrhea. Began about one week ago. Did evisit last week.     HPI:  72 year old female presents for evaluation of the above.  Patient states that she has had several bouts of respiratory illness and symptoms since October.  She has previously been treated for sinusitis and improved.  Patient reports that over the past week she has had cough, nasal congestion, chest congestion, headache.  She is also had diarrhea.  She recently had an e-visit and continues to be symptomatic.  Patient is concerned that she needs to see ear nose and throat given recurrent sinus issues.  No fever.  No other complaints.  Patient Active Problem List   Diagnosis Date Noted   Recurrent sinusitis 22/08/5425   Lichen sclerosus et atrophicus of the vulva 04/04/2021   Obesity (BMI 35.0-39.9 without comorbidity) 01/16/2021   Hyperlipidemia LDL goal <130 01/16/2021   Seasonal allergic rhinitis due to pollen 10/22/2020   Subclinical hypothyroidism 06/17/2020   History of colonic polyps    FH: colon cancer in relative <75 years old 12/03/2015   Hepatic steatosis 02/12/2009    Social Hx   Social History   Socioeconomic History   Marital status: Married    Spouse name: Not on file   Number of children: 1   Years of education: Not on file   Highest education level: Not on file  Occupational History   Occupation: bookeeping   Tobacco Use   Smoking status: Former   Smokeless tobacco: Never   Tobacco comments:    quit 1990  Vaping Use   Vaping Use: Never used  Substance and Sexual Activity   Alcohol use: Not Currently    Alcohol/week: 0.0 standard drinks of alcohol    Comment: occasional, not daily   Drug use: No   Sexual activity: Not Currently    Birth control/protection: Post-menopausal  Other Topics Concern   Not on  file  Social History Narrative   Not on file   Social Determinants of Health   Financial Resource Strain: Not on file  Food Insecurity: Not on file  Transportation Needs: Not on file  Physical Activity: Not on file  Stress: Not on file  Social Connections: Not on file    Review of Systems Per HPI  Objective:  BP 127/77   Pulse 96   Temp 97.9 F (36.6 C)   Wt 213 lb 12.8 oz (97 kg)   SpO2 96%   BMI 36.99 kg/m      07/28/2022   11:14 AM 05/08/2022    1:27 PM 05/05/2022   10:12 AM  BP/Weight  Systolic BP 062 376 283  Diastolic BP 77 74 84  Wt. (Lbs) 213.8 218 217.2  BMI 36.99 kg/m2 37.71 kg/m2 37.58 kg/m2    Physical Exam Vitals and nursing note reviewed.  Constitutional:      General: She is not in acute distress.    Appearance: Normal appearance.  HENT:     Head: Normocephalic and atraumatic.     Right Ear: Tympanic membrane normal.     Left Ear: Tympanic membrane normal.     Nose: Congestion present.     Mouth/Throat:     Pharynx: Posterior oropharyngeal erythema present.  Cardiovascular:     Rate and Rhythm: Normal rate and regular rhythm.  Pulmonary:     Effort: Pulmonary effort is normal.     Breath sounds: Normal breath sounds. No wheezing or rales.  Neurological:     Mental Status: She is alert.     Lab Results  Component Value Date   WBC 5.4 11/26/2021   HGB 14.8 11/26/2021   HCT 43.8 11/26/2021   PLT 254 11/26/2021   GLUCOSE 104 (H) 11/26/2021   CHOL 240 (H) 11/26/2021   TRIG 146 11/26/2021   HDL 62 11/26/2021   LDLCALC 152 (H) 11/26/2021   ALT 22 11/26/2021   AST 17 11/26/2021   NA 141 11/26/2021   K 4.3 11/26/2021   CL 105 11/26/2021   CREATININE 0.82 11/26/2021   BUN 17 11/26/2021   CO2 22 11/26/2021   TSH 7.120 (H) 06/10/2020   HGBA1C 5.2 11/26/2021     Assessment & Plan:   Problem List Items Addressed This Visit       Respiratory   Recurrent sinusitis - Primary    Treating with doxycycline. Referral to ENT  placed.      Relevant Medications   doxycycline (VIBRA-TABS) 100 MG tablet   Other Relevant Orders   Ambulatory referral to ENT    Meds ordered this encounter  Medications   doxycycline (VIBRA-TABS) 100 MG tablet    Sig: Take 1 tablet (100 mg total) by mouth 2 (two) times daily.    Dispense:  20 tablet    Refill:  0    Follow-up:  Return if symptoms worsen or fail to improve.  [place]

## 2022-07-28 NOTE — Patient Instructions (Signed)
Antibiotic as prescribed.  I went ahead and placed a referral to get the ball rolling.  Take care  Dr. Lacinda Axon

## 2022-08-04 ENCOUNTER — Other Ambulatory Visit: Payer: Self-pay | Admitting: Family Medicine

## 2022-08-04 ENCOUNTER — Telehealth: Payer: Self-pay

## 2022-08-04 ENCOUNTER — Encounter: Payer: Self-pay | Admitting: Family Medicine

## 2022-08-04 MED ORDER — AMOXICILLIN-POT CLAVULANATE 875-125 MG PO TABS: 1.0000 | ORAL_TABLET | Freq: Two times a day (BID) | ORAL | 0 refills | Status: DC

## 2022-08-04 NOTE — Telephone Encounter (Signed)
Pt called in said she needed another round of antibiotic she is still congested and she can not get into ENT until March   Pharmacy [facility]  [place]

## 2022-08-04 NOTE — Telephone Encounter (Signed)
Cook, Jayce G, DO   ? ?Rx sent.   ? ?

## 2022-08-04 NOTE — Telephone Encounter (Signed)
Patient notified

## 2022-08-10 ENCOUNTER — Other Ambulatory Visit (HOSPITAL_COMMUNITY): Payer: Self-pay | Admitting: Obstetrics & Gynecology

## 2022-08-10 DIAGNOSIS — Z1231 Encounter for screening mammogram for malignant neoplasm of breast: Secondary | ICD-10-CM

## 2022-08-28 DIAGNOSIS — M25562 Pain in left knee: Secondary | ICD-10-CM | POA: Insufficient documentation

## 2022-09-07 ENCOUNTER — Ambulatory Visit (HOSPITAL_COMMUNITY)
Admission: RE | Admit: 2022-09-07 | Discharge: 2022-09-07 | Disposition: A | Discharge status: Home / Self Care | Payer: Medicare HMO | Source: Ambulatory Visit | Attending: Obstetrics & Gynecology | Admitting: Obstetrics & Gynecology

## 2022-09-07 ENCOUNTER — Encounter (HOSPITAL_COMMUNITY): Payer: Self-pay

## 2022-09-07 DIAGNOSIS — Z1231 Encounter for screening mammogram for malignant neoplasm of breast: Secondary | ICD-10-CM | POA: Diagnosis not present

## 2022-09-17 DIAGNOSIS — J301 Allergic rhinitis due to pollen: Secondary | ICD-10-CM | POA: Diagnosis not present

## 2022-09-17 DIAGNOSIS — J329 Chronic sinusitis, unspecified: Secondary | ICD-10-CM | POA: Diagnosis not present

## 2022-09-17 DIAGNOSIS — M25562 Pain in left knee: Secondary | ICD-10-CM | POA: Diagnosis not present

## 2022-09-23 DIAGNOSIS — Z5189 Encounter for other specified aftercare: Secondary | ICD-10-CM | POA: Diagnosis not present

## 2022-09-28 ENCOUNTER — Ambulatory Visit (INDEPENDENT_AMBULATORY_CARE_PROVIDER_SITE_OTHER): Payer: Medicare HMO | Admitting: Family Medicine

## 2022-09-28 VITALS — BP 128/84 | HR 86 | Temp 97.7°F | Ht 63.75 in | Wt 218.0 lb

## 2022-09-28 DIAGNOSIS — Z01818 Encounter for other preprocedural examination: Secondary | ICD-10-CM | POA: Diagnosis not present

## 2022-09-28 NOTE — Assessment & Plan Note (Signed)
Doing well.  Low surgical risk. Form filled out regarding preoperative risk assessment.

## 2022-09-28 NOTE — Progress Notes (Signed)
Subjective:  Patient ID: Julie Brown, female    DOB: Nov 12, 1950  Age: 72 y.o. MRN: [id number]  CC: Chief Complaint  Patient presents with   surgical clearance     Left knee in May 2024    HPI:  72 year old female presents for surgical clearance.  Patient is scheduled to have left knee replacement in May.  She is here for preoperative examination and determination of risk.  Patient has underlying hyperlipidemia.  Does not have type 2 diabetes.  She is active and has no barriers to her activity level excluding knee osteoarthritis.  Denies chest pain or shortness of breath.  She is currently suffering from allergies.  Otherwise she is feeling well.  No other complaints or concerns at this time.  Patient Active Problem List   Diagnosis Date Noted   Preoperative examination 09/28/2022   Recurrent sinusitis [id number]   Lichen sclerosus et atrophicus of the vulva 04/04/2021   Obesity (BMI 35.0-39.9 without comorbidity) 01/16/2021   Hyperlipidemia LDL goal <130 01/16/2021   Seasonal allergic rhinitis due to pollen 10/22/2020   Subclinical hypothyroidism 06/17/2020   FH: colon cancer in relative <48 years old 12/03/2015   Hepatic steatosis 02/12/2009    Social Hx   Social History   Socioeconomic History   Marital status: Married    Spouse name: Not on file   Number of children: 1   Years of education: Not on file   Highest education level: Associate degree: academic program  Occupational History   Occupation: bookeeping   Tobacco Use   Smoking status: Former   Smokeless tobacco: Never   Tobacco comments:    quit 1990  Vaping Use   Vaping Use: Never used  Substance and Sexual Activity   Alcohol use: Not Currently    Alcohol/week: 0.0 standard drinks of alcohol    Comment: occasional, not daily   Drug use: No   Sexual activity: Not Currently    Birth control/protection: Post-menopausal  Other Topics Concern   Not on file  Social History Narrative   Not on file    Social Determinants of Health   Financial Resource Strain: Low Risk  (09/24/2022)   Overall Financial Resource Strain (CARDIA)    Difficulty of Paying Living Expenses: Not hard at all  Food Insecurity: No Food Insecurity (09/24/2022)   Hunger Vital Sign    Worried About Running Out of Food in the Last Year: Never true    [place] in the Last Year: Never true  Transportation Needs: No Transportation Needs (09/24/2022)   PRAPARE - Hydrologist (Medical): No    Lack of Transportation (Non-Medical): No  Physical Activity: Unknown (09/24/2022)   Exercise Vital Sign    Days of Exercise per Week: 0 days    Minutes of Exercise per Session: Not on file  Stress: No Stress Concern Present (09/24/2022)   [place]    Feeling of Stress : Not at all  Social Connections: [place] (09/24/2022)   Social Connection and Isolation Panel [NHANES]    Frequency of Communication with Friends and Family: Three times a week    Frequency of Social Gatherings with Friends and Family: Twice a week    Attends Religious Services: More than 4 times per year    Active Member of [employer] or Organizations: Yes    Attends Archivist Meetings: 1 to 4 times per year    Marital  Status: Married    Review of Systems Per HPI  Objective:  BP 128/84   Pulse 86   Temp 97.7 F (36.5 C)   Ht 5' 3.75" (1.619 m)   Wt 218 lb (98.9 kg)   SpO2 95%   BMI 37.71 kg/m      09/28/2022    9:32 AM 07/28/2022   11:14 AM 05/08/2022    1:27 PM  BP/Weight  Systolic BP 0000000 [id number]  Diastolic BP 84 77 74  Wt. (Lbs) 218 213.8 218  BMI 37.71 kg/m2 36.99 kg/m2 37.71 kg/m2    Physical Exam Vitals and nursing note reviewed.  Constitutional:      General: She is not in acute distress.    Appearance: Normal appearance.  HENT:     Head: Normocephalic and atraumatic.  Eyes:     General:        Right eye: No  discharge.        Left eye: No discharge.     Conjunctiva/sclera: Conjunctivae normal.  Cardiovascular:     Rate and Rhythm: Normal rate and regular rhythm.  Pulmonary:     Effort: Pulmonary effort is normal.     Breath sounds: Normal breath sounds. No wheezing, rhonchi or rales.  Neurological:     Mental Status: She is alert.  Psychiatric:        Mood and Affect: Mood normal.        Behavior: Behavior normal.     Lab Results  Component Value Date   WBC 5.4 11/26/2021   HGB 14.8 11/26/2021   HCT 43.8 11/26/2021   PLT 254 11/26/2021   GLUCOSE 104 (H) 11/26/2021   CHOL 240 (H) 11/26/2021   TRIG 146 11/26/2021   HDL 62 11/26/2021   LDLCALC 152 (H) 11/26/2021   ALT 22 11/26/2021   AST 17 11/26/2021   NA 141 11/26/2021   K 4.3 11/26/2021   CL 105 11/26/2021   CREATININE 0.82 11/26/2021   BUN 17 11/26/2021   CO2 22 11/26/2021   TSH 7.120 (H) 06/10/2020   HGBA1C 5.2 11/26/2021     Assessment & Plan:   Problem List Items Addressed This Visit       Other   Preoperative examination - Primary    Doing well.  Low surgical risk. Form filled out regarding preoperative risk assessment.       Follow-up:  Return in about 6 months (around 03/31/2023).  [place]

## 2022-09-28 NOTE — Patient Instructions (Addendum)
I will fax the form over today.  Good luck with surgery.   Follow up in 6 months.

## 2022-10-07 DIAGNOSIS — J329 Chronic sinusitis, unspecified: Secondary | ICD-10-CM | POA: Diagnosis not present

## 2022-10-07 DIAGNOSIS — J301 Allergic rhinitis due to pollen: Secondary | ICD-10-CM | POA: Diagnosis not present

## 2022-10-20 DIAGNOSIS — Z96651 Presence of right artificial knee joint: Secondary | ICD-10-CM | POA: Insufficient documentation

## 2022-10-21 DIAGNOSIS — Z96651 Presence of right artificial knee joint: Secondary | ICD-10-CM | POA: Diagnosis not present

## 2022-10-30 DIAGNOSIS — Z79899 Other long term (current) drug therapy: Secondary | ICD-10-CM | POA: Diagnosis not present

## 2022-10-30 DIAGNOSIS — Z0189 Encounter for other specified special examinations: Secondary | ICD-10-CM | POA: Diagnosis not present

## 2022-11-04 ENCOUNTER — Other Ambulatory Visit: Payer: Self-pay | Admitting: Family Medicine

## 2022-11-05 DIAGNOSIS — K760 Fatty (change of) liver, not elsewhere classified: Secondary | ICD-10-CM | POA: Diagnosis not present

## 2022-11-05 DIAGNOSIS — R609 Edema, unspecified: Secondary | ICD-10-CM | POA: Diagnosis not present

## 2022-11-05 DIAGNOSIS — J301 Allergic rhinitis due to pollen: Secondary | ICD-10-CM | POA: Diagnosis not present

## 2022-11-05 DIAGNOSIS — Z79620 Long term (current) use of immunosuppressive biologic: Secondary | ICD-10-CM | POA: Diagnosis not present

## 2022-11-05 DIAGNOSIS — Z791 Long term (current) use of non-steroidal anti-inflammatories (NSAID): Secondary | ICD-10-CM | POA: Diagnosis not present

## 2022-11-05 DIAGNOSIS — E876 Hypokalemia: Secondary | ICD-10-CM | POA: Diagnosis not present

## 2022-11-05 DIAGNOSIS — Z800 Family history of malignant neoplasm of digestive organs: Secondary | ICD-10-CM | POA: Diagnosis not present

## 2022-11-05 DIAGNOSIS — M1990 Unspecified osteoarthritis, unspecified site: Secondary | ICD-10-CM | POA: Diagnosis not present

## 2022-11-05 DIAGNOSIS — I10 Essential (primary) hypertension: Secondary | ICD-10-CM | POA: Diagnosis not present

## 2022-11-05 DIAGNOSIS — E785 Hyperlipidemia, unspecified: Secondary | ICD-10-CM | POA: Diagnosis not present

## 2022-11-05 DIAGNOSIS — Z6837 Body mass index (BMI) 37.0-37.9, adult: Secondary | ICD-10-CM | POA: Diagnosis not present

## 2022-11-06 ENCOUNTER — Other Ambulatory Visit: Payer: Self-pay

## 2022-11-06 ENCOUNTER — Encounter: Payer: Self-pay | Admitting: Family Medicine

## 2022-11-06 ENCOUNTER — Ambulatory Visit: Payer: Medicare HMO | Admitting: Family Medicine

## 2022-11-06 VITALS — BP 120/78 | HR 97 | Temp 97.9°F | Resp 16

## 2022-11-06 DIAGNOSIS — J3089 Other allergic rhinitis: Secondary | ICD-10-CM

## 2022-11-06 DIAGNOSIS — J302 Other seasonal allergic rhinitis: Secondary | ICD-10-CM

## 2022-11-06 DIAGNOSIS — H1010 Acute atopic conjunctivitis, unspecified eye: Secondary | ICD-10-CM

## 2022-11-06 DIAGNOSIS — L2089 Other atopic dermatitis: Secondary | ICD-10-CM

## 2022-11-06 DIAGNOSIS — H1013 Acute atopic conjunctivitis, bilateral: Secondary | ICD-10-CM

## 2022-11-06 MED ORDER — OLOPATADINE HCL 0.2 % OP SOLN: 1.0000 [drp] | Freq: Every day | OPHTHALMIC | 2 refills | Status: DC | PRN

## 2022-11-06 MED ORDER — FLUTICASONE PROPIONATE 50 MCG/ACT NA SUSP: 2.0000 | Freq: Every day | NASAL | 2 refills | Status: DC

## 2022-11-06 NOTE — Patient Instructions (Addendum)
Allergic rhinitis Your environmental allergy skin testing was positive to grass pollen, ragweed pollen, mold, cat and dust mites Continue cetirizine 10 mg once a day as needed for runny nose or itch. Remember to rotate to a different antihistamine about every 3 months. Some examples of over the counter antihistamines include Zyrtec (cetirizine), Xyzal (levocetirizine), Allegra (fexofenadine), and Claritin (loratidine).  Continue Flonase 2 sprays in each nostril once a day as needed for a stuffy nose.  In the right nostril, point the applicator out toward the right ear. In the left nostril, point the applicator out toward the left ear Consider saline nasal rinses as needed for nasal symptoms. Use this before any medicated nasal sprays for best result Consider allergen immunotherapy if your symptoms are not well controlled with the treatment plan as listed above  Allergic conjunctivitis Some over the counter eye drops include Pataday one drop in each eye once a day as needed for red, itchy eyes OR Zaditor one drop in each eye twice a day as needed for red itchy eyes. Avoid eye drops that say red eye relief as they may contain medications that dry out your eyes.   Allergic contact dermatitis True Test patch testing was negative at a previous visit  Atopic dermatitis Continue a twice a day moisturizing routine Continue Dupixent injections 300 mg once every 2 weeks  Call the clinic if this treatment plan is not working well for you.  Follow up in 3 months or sooner if needed.  Reducing Pollen Exposure The American Academy of Allergy, Asthma and Immunology suggests the following steps to reduce your exposure to pollen during allergy seasons. Do not hang sheets or clothing out to dry; pollen may collect on these items. Do not mow lawns or spend time around freshly cut grass; mowing stirs up pollen. Keep windows closed at night.  Keep car windows closed while driving. Minimize morning activities  outdoors, a time when pollen counts are usually at their highest. Stay indoors as much as possible when pollen counts or humidity is high and on windy days when pollen tends to remain in the air longer. Use air conditioning when possible.  Many air conditioners have filters that trap the pollen spores. Use a HEPA room air filter to remove pollen form the indoor air you breathe.  Control of Mold Allergen Mold and fungi can grow on a variety of surfaces provided certain temperature and moisture conditions exist.  Outdoor molds grow on plants, decaying vegetation and soil.  The major outdoor mold, Alternaria and Cladosporium, are found in very high numbers during hot and dry conditions.  Generally, a late Summer - Fall peak is seen for common outdoor fungal spores.  Rain will temporarily lower outdoor mold spore count, but counts rise rapidly when the rainy period ends.  The most important indoor molds are Aspergillus and Penicillium.  Dark, humid and poorly ventilated basements are ideal sites for mold growth.  The next most common sites of mold growth are the bathroom and the kitchen.  Outdoor [employer] Use air conditioning and keep windows closed Avoid exposure to decaying vegetation. Avoid leaf raking. Avoid grain handling. Consider wearing a face mask if working in moldy areas.  Indoor Mold Control Maintain humidity below 50%. Clean washable surfaces with 5% bleach solution. Remove sources e.g. Contaminated carpets.  Control of Dog or Cat Allergen Avoidance is the best way to manage a dog or cat allergy. If you have a dog or cat and are allergic to dog  or cats, consider removing the dog or cat from the home. If you have a dog or cat but don't want to find it a new home, or if your family wants a pet even though someone in the household is allergic, here are some strategies that may help keep symptoms at bay:  Keep the pet out of your bedroom and restrict it to only a few rooms. Be  advised that keeping the dog or cat in only one room will not limit the allergens to that room. Don't pet, hug or kiss the dog or cat; if you do, wash your hands with soap and water. High-efficiency particulate air (HEPA) cleaners run continuously in a bedroom or living room can reduce allergen levels over time. Regular use of a high-efficiency vacuum cleaner or a central vacuum can reduce allergen levels. Giving your dog or cat a bath at least once a week can reduce airborne allergen.   Control of Dust Mite Allergen Dust mites play a major role in allergic asthma and rhinitis. They occur in environments with high humidity wherever human skin is found. Dust mites absorb humidity from the atmosphere (ie, they do not drink) and feed on organic matter (including shed human and animal skin). Dust mites are a microscopic type of insect that you cannot see with the naked eye. High levels of dust mites have been detected from mattresses, pillows, carpets, upholstered furniture, bed covers, clothes, soft toys and any woven material. The principal allergen of the dust mite is found in its feces. A gram of dust may contain 1,000 mites and 250,000 fecal particles. Mite antigen is easily measured in the air during house cleaning activities. Dust mites do not bite and do not cause harm to humans, other than by triggering allergies/asthma.  Ways to decrease your exposure to dust mites in your home:  1. Encase mattresses, box springs and pillows with a mite-impermeable barrier or cover  2. Wash sheets, blankets and drapes weekly in hot water (130 F) with detergent and dry them in a dryer on the hot setting.  3. Have the room cleaned frequently with a vacuum cleaner and a damp dust-mop. For carpeting or rugs, vacuuming with a vacuum cleaner equipped with a high-efficiency particulate air (HEPA) filter. The dust mite allergic individual should not be in a room which is being cleaned and should wait 1 hour after  cleaning before going into the room.  4. Do not sleep on upholstered furniture (eg, couches).  5. If possible removing carpeting, upholstered furniture and drapery from the home is ideal. Horizontal blinds should be eliminated in the rooms where the person spends the most time (bedroom, study, television room). Washable vinyl, roller-type shades are optimal.  6. Remove all non-washable stuffed toys from the bedroom. Wash stuffed toys weekly like sheets and blankets above.  7. Reduce indoor humidity to less than 50%. Inexpensive humidity monitors can be purchased at most hardware stores. Do not use a humidifier as can make the problem worse and are not recommended.

## 2022-11-06 NOTE — Progress Notes (Signed)
[address] [place] [address] Dept: [phone]  FOLLOW UP NOTE  Patient ID: Julie Brown, female    DOB: 1950-09-11  Age: 72 y.o. MRN: [id number] Date of Office Visit: 11/06/2022  Assessment  Chief Complaint: Allergy Testing and Hoarse  HPI Julie Brown is a 72 year old female who presents to the clinic for follow-up visit with environmental allergy skin testing.  She was last seen in this clinic on 04/22/2022 by Dr. Dellis Anes for evaluation of allergic rhinitis, allergic conjunctivitis, atopic dermatitis, and contact dermatitis.    At today's visit, she reports her allergic rhinitis as poorly controlled with symptoms including nasal congestion, clear rhinorrhea, and copious postnasal drainage.  She continues cetirizine and saline nasal rinses, however, has not had any antihistamine over the last 3 days.  Previous lab results from 04/22/2022 indicate environmental allergies to grass and mold.  She returns to the clinic for environmental allergy skin testing.   Allergic conjunctivitis is reported as well-controlled with occasional red and itchy eyes for which he uses olopatadine with relief of symptoms.   Atopic dermatitis is reported as well-controlled with rare occurrences of red and itchy areas.  She continues a twice a day moisturizing routine as well as Dupixent injections 300 mg once every 2 weeks with no large or local reactions.  She reports a significant decrease in her symptoms of atopic dermatitis while continuing on Dupixent injections.  Her current medications are listed in the chart.  Drug Allergies:  No Active Allergies  Physical Exam: BP 120/78   Pulse 97   Temp 97.9 F (36.6 C) (Temporal)   Resp 16   SpO2 95%    Physical Exam Vitals reviewed.  Constitutional:      Appearance: Normal appearance.  HENT:     Head: Normocephalic and atraumatic.     Right Ear: Tympanic membrane normal.     Left Ear: Tympanic membrane normal.      Nose:     Comments: Bilateral naris edematous and pale with clear nasal drainage noted.  Pharynx slightly erythematous with no exudate.  Ears normal.  Eyes normal.    Mouth/Throat:     Pharynx: Oropharynx is clear.  Eyes:     Conjunctiva/sclera: Conjunctivae normal.  Cardiovascular:     Rate and Rhythm: Normal rate and regular rhythm.     Heart sounds: Normal heart sounds. No murmur heard. Pulmonary:     Effort: Pulmonary effort is normal.     Breath sounds: Normal breath sounds.     Comments: Lungs clear to auscultation Musculoskeletal:        General: Normal range of motion.     Cervical back: Normal range of motion and neck supple.  Skin:    General: Skin is warm and dry.  Neurological:     Mental Status: She is alert and oriented to person, place, and time.  Psychiatric:        Mood and Affect: Mood normal.        Behavior: Behavior normal.        Thought Content: Thought content normal.        Judgment: Judgment normal.     Diagnostics: Percutaneous environmental allergy skin testing was positive to grass pollen with adequate controls  Intradermal environmental allergy skin testing was positive to ragweed pollen, mold, cat, and dust mites with an adequate control.  Assessment and Plan: 1. Seasonal and perennial allergic rhinitis   2. Flexural atopic dermatitis   3. Seasonal allergic conjunctivitis  Meds ordered this encounter  Medications   fluticasone (FLONASE) 50 MCG/ACT nasal spray    Sig: Place 2 sprays into both nostrils daily.    Dispense:  16 g    Refill:  2   Olopatadine HCl (PATADAY) 0.2 % SOLN    Sig: Place 1 drop into both eyes daily as needed.    Dispense:  2.5 mL    Refill:  2    Patient Instructions  Allergic rhinitis Your environmental allergy skin testing was positive to grass pollen, ragweed pollen, mold, cat and dust mites Continue cetirizine 10 mg once a day as needed for runny nose or itch. Remember to rotate to a different  antihistamine about every 3 months. Some examples of over the counter antihistamines include Zyrtec (cetirizine), Xyzal (levocetirizine), Allegra (fexofenadine), and Claritin (loratidine).  Continue Flonase 2 sprays in each nostril once a day as needed for a stuffy nose.  In the right nostril, point the applicator out toward the right ear. In the left nostril, point the applicator out toward the left ear Consider saline nasal rinses as needed for nasal symptoms. Use this before any medicated nasal sprays for best result Consider allergen immunotherapy if your symptoms are not well controlled with the treatment plan as listed above  Allergic conjunctivitis Some over the counter eye drops include Pataday one drop in each eye once a day as needed for red, itchy eyes OR Zaditor one drop in each eye twice a day as needed for red itchy eyes. Avoid eye drops that say red eye relief as they may contain medications that dry out your eyes.   Allergic contact dermatitis True Test patch testing was negative at a previous visit  Atopic dermatitis Continue a twice a day moisturizing routine Continue Dupixent injections 300 mg once every 2 weeks  Call the clinic if this treatment plan is not working well for you.  Follow up in 3 months or sooner if needed.   Return in about 3 months (around 02/06/2023), or if symptoms worsen or fail to improve.    Thank you for the opportunity to care for this patient.  Please do not hesitate to contact me with questions.  Thermon Leyland, FNP [facility]

## 2022-11-17 DIAGNOSIS — M1712 Unilateral primary osteoarthritis, left knee: Secondary | ICD-10-CM | POA: Diagnosis not present

## 2022-11-17 DIAGNOSIS — G8918 Other acute postprocedural pain: Secondary | ICD-10-CM | POA: Diagnosis not present

## 2022-11-18 DIAGNOSIS — M25662 Stiffness of left knee, not elsewhere classified: Secondary | ICD-10-CM | POA: Insufficient documentation

## 2022-11-23 DIAGNOSIS — M25562 Pain in left knee: Secondary | ICD-10-CM | POA: Diagnosis not present

## 2022-11-23 DIAGNOSIS — R269 Unspecified abnormalities of gait and mobility: Secondary | ICD-10-CM | POA: Diagnosis not present

## 2022-11-23 DIAGNOSIS — M25662 Stiffness of left knee, not elsewhere classified: Secondary | ICD-10-CM | POA: Diagnosis not present

## 2022-11-25 DIAGNOSIS — M25662 Stiffness of left knee, not elsewhere classified: Secondary | ICD-10-CM | POA: Diagnosis not present

## 2022-11-25 DIAGNOSIS — M25562 Pain in left knee: Secondary | ICD-10-CM | POA: Diagnosis not present

## 2022-11-25 DIAGNOSIS — R269 Unspecified abnormalities of gait and mobility: Secondary | ICD-10-CM | POA: Diagnosis not present

## 2022-11-27 DIAGNOSIS — M25562 Pain in left knee: Secondary | ICD-10-CM | POA: Diagnosis not present

## 2022-11-27 DIAGNOSIS — M25662 Stiffness of left knee, not elsewhere classified: Secondary | ICD-10-CM | POA: Diagnosis not present

## 2022-11-27 DIAGNOSIS — R269 Unspecified abnormalities of gait and mobility: Secondary | ICD-10-CM | POA: Diagnosis not present

## 2022-12-02 DIAGNOSIS — M25662 Stiffness of left knee, not elsewhere classified: Secondary | ICD-10-CM | POA: Diagnosis not present

## 2022-12-02 DIAGNOSIS — R269 Unspecified abnormalities of gait and mobility: Secondary | ICD-10-CM | POA: Diagnosis not present

## 2022-12-02 DIAGNOSIS — M25562 Pain in left knee: Secondary | ICD-10-CM | POA: Diagnosis not present

## 2022-12-04 DIAGNOSIS — M25562 Pain in left knee: Secondary | ICD-10-CM | POA: Diagnosis not present

## 2022-12-04 DIAGNOSIS — R269 Unspecified abnormalities of gait and mobility: Secondary | ICD-10-CM | POA: Diagnosis not present

## 2022-12-04 DIAGNOSIS — M25662 Stiffness of left knee, not elsewhere classified: Secondary | ICD-10-CM | POA: Diagnosis not present

## 2022-12-07 DIAGNOSIS — R269 Unspecified abnormalities of gait and mobility: Secondary | ICD-10-CM | POA: Diagnosis not present

## 2022-12-07 DIAGNOSIS — M25562 Pain in left knee: Secondary | ICD-10-CM | POA: Diagnosis not present

## 2022-12-07 DIAGNOSIS — M25662 Stiffness of left knee, not elsewhere classified: Secondary | ICD-10-CM | POA: Diagnosis not present

## 2022-12-09 DIAGNOSIS — M25662 Stiffness of left knee, not elsewhere classified: Secondary | ICD-10-CM | POA: Diagnosis not present

## 2022-12-09 DIAGNOSIS — M25562 Pain in left knee: Secondary | ICD-10-CM | POA: Diagnosis not present

## 2022-12-09 DIAGNOSIS — R269 Unspecified abnormalities of gait and mobility: Secondary | ICD-10-CM | POA: Diagnosis not present

## 2022-12-11 DIAGNOSIS — M25662 Stiffness of left knee, not elsewhere classified: Secondary | ICD-10-CM | POA: Diagnosis not present

## 2022-12-11 DIAGNOSIS — M25562 Pain in left knee: Secondary | ICD-10-CM | POA: Diagnosis not present

## 2022-12-11 DIAGNOSIS — R269 Unspecified abnormalities of gait and mobility: Secondary | ICD-10-CM | POA: Diagnosis not present

## 2022-12-14 DIAGNOSIS — R269 Unspecified abnormalities of gait and mobility: Secondary | ICD-10-CM | POA: Diagnosis not present

## 2022-12-14 DIAGNOSIS — M25562 Pain in left knee: Secondary | ICD-10-CM | POA: Diagnosis not present

## 2022-12-14 DIAGNOSIS — M25662 Stiffness of left knee, not elsewhere classified: Secondary | ICD-10-CM | POA: Diagnosis not present

## 2022-12-16 DIAGNOSIS — M25662 Stiffness of left knee, not elsewhere classified: Secondary | ICD-10-CM | POA: Diagnosis not present

## 2022-12-16 DIAGNOSIS — M25562 Pain in left knee: Secondary | ICD-10-CM | POA: Diagnosis not present

## 2022-12-16 DIAGNOSIS — R269 Unspecified abnormalities of gait and mobility: Secondary | ICD-10-CM | POA: Diagnosis not present

## 2022-12-18 DIAGNOSIS — R269 Unspecified abnormalities of gait and mobility: Secondary | ICD-10-CM | POA: Diagnosis not present

## 2022-12-18 DIAGNOSIS — M25662 Stiffness of left knee, not elsewhere classified: Secondary | ICD-10-CM | POA: Diagnosis not present

## 2022-12-18 DIAGNOSIS — M25562 Pain in left knee: Secondary | ICD-10-CM | POA: Diagnosis not present

## 2022-12-21 DIAGNOSIS — M25662 Stiffness of left knee, not elsewhere classified: Secondary | ICD-10-CM | POA: Diagnosis not present

## 2022-12-21 DIAGNOSIS — M25562 Pain in left knee: Secondary | ICD-10-CM | POA: Diagnosis not present

## 2022-12-21 DIAGNOSIS — R269 Unspecified abnormalities of gait and mobility: Secondary | ICD-10-CM | POA: Diagnosis not present

## 2022-12-22 ENCOUNTER — Other Ambulatory Visit: Payer: Self-pay | Admitting: Family Medicine

## 2022-12-22 ENCOUNTER — Encounter: Payer: Self-pay | Admitting: Family Medicine

## 2022-12-22 DIAGNOSIS — R600 Localized edema: Secondary | ICD-10-CM

## 2022-12-22 DIAGNOSIS — Z4789 Encounter for other orthopedic aftercare: Secondary | ICD-10-CM | POA: Diagnosis not present

## 2022-12-22 MED ORDER — POTASSIUM CHLORIDE ER 10 MEQ PO TBCR: 10.0000 meq | EXTENDED_RELEASE_TABLET | Freq: Every day | ORAL | 0 refills | Status: DC

## 2023-01-14 DIAGNOSIS — B351 Tinea unguium: Secondary | ICD-10-CM | POA: Diagnosis not present

## 2023-02-02 ENCOUNTER — Other Ambulatory Visit: Payer: Self-pay | Admitting: Family Medicine

## 2023-02-03 ENCOUNTER — Encounter: Payer: Self-pay | Admitting: Allergy & Immunology

## 2023-02-03 ENCOUNTER — Other Ambulatory Visit: Payer: Self-pay

## 2023-02-03 ENCOUNTER — Ambulatory Visit: Payer: Medicare HMO | Admitting: Allergy & Immunology

## 2023-02-03 VITALS — BP 128/80 | HR 112 | Temp 98.6°F | Resp 20 | Ht 64.0 in | Wt 221.6 lb

## 2023-02-03 DIAGNOSIS — L2089 Other atopic dermatitis: Secondary | ICD-10-CM | POA: Diagnosis not present

## 2023-02-03 DIAGNOSIS — J302 Other seasonal allergic rhinitis: Secondary | ICD-10-CM | POA: Diagnosis not present

## 2023-02-03 DIAGNOSIS — J3089 Other allergic rhinitis: Secondary | ICD-10-CM

## 2023-02-03 DIAGNOSIS — L230 Allergic contact dermatitis due to metals: Secondary | ICD-10-CM | POA: Diagnosis not present

## 2023-02-03 MED ORDER — FLUOCINONIDE 0.1 % EX CREA: TOPICAL_CREAM | CUTANEOUS | 2 refills | Status: DC

## 2023-02-03 NOTE — Patient Instructions (Addendum)
Perennial and seasonal allergic rhinitis  (grass, ragweed, mold, cat, dust mite) - Continue with cetirizine 10mg  daily.  - Continue with Flonase 2 sprays in each nostril once a day as needed for a stuffy nose - Consider allergy shots.  2. Atopic dermatitis - Continue a twice a day moisturizing routine - Continue Dupixent injections 300 mg once every 2 weeks - You can use two cetirizine at night to help with the itching if you are interested.  - Another option is Rinvoq (daily pill), but this is in the class of medications that can cause increased cardiac events in high risk populations (stems from the firsts member of the class Harriette Ohara,- but Rinvoq is much safer).   3. Return in about 1 year (around 02/03/2024).    Please inform us of any Emergency Department visits, hospitalizations, or changes in symptoms. Call us before going to the ED for breathing or allergy symptoms since we might be able to fit you in for a sick visit. Feel free to contact us anytime with any questions, problems, or concerns.  It was a pleasure to see you again today!  Websites that have reliable patient information: 1. American Academy of Asthma, Allergy, and Immunology: www.aaaai.org 2. Food Allergy Research and Education (FARE): foodallergy.org 3. Mothers of Asthmatics: http://www.asthmacommunitynetwork.org 4. American College of Allergy, Asthma, and Immunology: www.acaai.org   COVID-19 Vaccine Information can be found at: [email or web address] For questions related to vaccine distribution or appointments, please email [email or web address] or call [phone].   We realize that you might be concerned about having an allergic reaction to the COVID19 vaccines. To help with that concern, WE ARE OFFERING THE COVID19 VACCINES IN OUR OFFICE! Ask the front desk for dates!     "Like" [place] on Facebook and Instagram for our latest updates!      A healthy  democracy works best when [employer] participate! Make sure you are registered to vote! If you have moved or changed any of your contact information, you will need to get this updated before voting!  In some cases, you MAY be able to register to vote online: [email or web address]

## 2023-02-03 NOTE — Progress Notes (Signed)
FOLLOW UP  Date of Service/Encounter:  02/03/23   Assessment:   Perennial and seasonal allergic rhinitis  (grass, ragweed, mold, cat, dust mite) - controlled with antihistamines along   Flexural atopic dermatitis - well controlled on Dupixent   Allergic contact dermatitis - with negative patch testing (tolerated bilateral knee replacement)     Plan/Recommendations:   Perennial and seasonal allergic rhinitis  (grass, ragweed, mold, cat, dust mite) - Continue with cetirizine 10mg  daily.  - Continue with Flonase 2 sprays in each nostril once a day as needed for a stuffy nose - Consider allergy shots.  2. Atopic dermatitis - Continue a twice a day moisturizing routine - Continue Dupixent injections 300 mg once every 2 weeks - You can use two cetirizine at night to help with the itching if you are interested.  - Another option is Rinvoq (daily pill), but this is in the class of medications that can cause increased cardiac events in high risk populations (stems from the firsts member of the class Harriette Ohara,- but Rinvoq is much safer).   3. Return in about 1 year (around 02/03/2024).    Subjective:   Julie Brown is a 72 y.o. female presenting today for follow up of  Chief Complaint  Patient presents with   Follow-up    Dry skin patch on back and top of tail bone    Julie Brown has a history of the following: Patient Active Problem List   Diagnosis Date Noted   Preoperative examination 09/28/2022   Recurrent sinusitis 07/28/2022   Lichen sclerosus et atrophicus of the vulva 04/04/2021   Obesity (BMI 35.0-39.9 without comorbidity) 01/16/2021   Hyperlipidemia LDL goal <130 01/16/2021   Seasonal allergic rhinitis due to pollen 10/22/2020   Subclinical hypothyroidism 06/17/2020   FH: colon cancer in relative <31 years old 12/03/2015   Hepatic steatosis 02/12/2009    History obtained from: chart review and patient.  Julie Brown is a 72 y.o. female presenting for a  follow up visit. She was last seen in May 2024. At that time, she was continued an on OTC antihistamine as well as eye dorps as well. Atopic dermatitis was under excellent control with Dupixent every 2 weeks.   She had both knees replaced (November 2023 and May 2024). She is now mobile. She did have physical therapy at home. She is now getting around very well.   Allergic Rhinitis Symptom History: Environmental allergies are under good control. She has not had major outbreak.  She has not been on antibiotics. She has not had any episodes of sinusitis. She has not been doing the mowing and outdoor yard work since she had her knee joints replaced. But she is going to try to get out more to do that. She does not think that she needs any allergy shots.   Skin Symptom History: She does have a patch on her back.  He has a "divet" in her tailbone. She gets a rash in this area. It does burn. She uses fluoconide for this. She is doing well with the Dupixent. We talked about doing triamcinolone, but she does not use this at all. She is interested in learning more about other treatment options for the itching, but she is largely doing better than before she started 5 years ago. We spent some time discussing Rinvoq and the risks/benefits.   Otherwise, there have been no changes to her past medical history, surgical history, family history, or social history.    Review  of systems otherwise negative other than that mentioned in the HPI.    Objective:   Blood pressure 128/80, pulse (!) 112, temperature 98.6 F (37 C), resp. rate 20, height 5\' 4"  (1.626 m), weight 221 lb 9.6 oz (100.5 kg), SpO2 96%. Body mass index is 38.04 kg/m.    Physical Exam Vitals reviewed.  Constitutional:      Appearance: She is well-developed.  HENT:     Head: Normocephalic and atraumatic.     Right Ear: Tympanic membrane, ear canal and external ear normal.     Left Ear: Tympanic membrane, ear canal and external ear normal.      Nose: No nasal deformity, septal deviation, mucosal edema or rhinorrhea.     Right Turbinates: Enlarged, swollen and pale.     Left Turbinates: Enlarged, swollen and pale.     Right Sinus: No maxillary sinus tenderness or frontal sinus tenderness.     Left Sinus: No maxillary sinus tenderness or frontal sinus tenderness.     Mouth/Throat:     Lips: Pink.     Mouth: Mucous membranes are moist. Mucous membranes are not pale and not dry.     Pharynx: Uvula midline.     Comments: Minimal cobblestoning.  Eyes:     General: Lids are normal. No allergic shiner.       Right eye: No discharge.        Left eye: No discharge.     Conjunctiva/sclera: Conjunctivae normal.     Right eye: Right conjunctiva is not injected. No chemosis.    Left eye: Left conjunctiva is not injected. No chemosis.    Pupils: Pupils are equal, round, and reactive to light.  Cardiovascular:     Rate and Rhythm: Normal rate and regular rhythm.     Heart sounds: Normal heart sounds.  Pulmonary:     Effort: Pulmonary effort is normal. No tachypnea, accessory muscle usage or respiratory distress.     Breath sounds: Normal breath sounds. No wheezing, rhonchi or rales.  Chest:     Chest wall: No tenderness.  Lymphadenopathy:     Cervical: No cervical adenopathy.  Skin:    Coloration: Skin is not pale.     Findings: No abrasion, erythema, petechiae or rash. Rash is not papular, urticarial or vesicular.     Comments: She has a couple of small cysts on the back.  Neurological:     Mental Status: She is alert.  Psychiatric:        Behavior: Behavior is cooperative.      Diagnostic studies: none     Malachi Bonds, MD  [facility]

## 2023-02-04 ENCOUNTER — Telehealth: Payer: Self-pay | Admitting: Allergy & Immunology

## 2023-02-04 ENCOUNTER — Other Ambulatory Visit (HOSPITAL_COMMUNITY): Payer: Self-pay

## 2023-02-04 ENCOUNTER — Other Ambulatory Visit: Payer: Self-pay | Admitting: *Deleted

## 2023-02-04 DIAGNOSIS — L2084 Intrinsic (allergic) eczema: Secondary | ICD-10-CM

## 2023-02-04 MED ORDER — DUPIXENT 300 MG/2ML ~~LOC~~ SOAJ: 300.0000 mg | SUBCUTANEOUS | 3 refills | Status: DC

## 2023-02-04 NOTE — Telephone Encounter (Signed)
Patient called and stated she wants to talk to someone about the medication Dupixdent getting filled because it has to go to several different places and requested a call back at [phone].

## 2023-02-04 NOTE — Telephone Encounter (Signed)
Called patient and she requested we refill her Dupixent order to [facility]. Rx sent

## 2023-02-05 ENCOUNTER — Encounter: Payer: Self-pay | Admitting: Allergy & Immunology

## 2023-02-05 NOTE — Telephone Encounter (Signed)
PA for Fluocinonide Cream 0.1%...  KEYKonrad Penta [id number] Created: 02/05/23  Sent to [facility]  PA  APPROVED 07/06/2022 - 07/06/2023.     Called [facility] - spoke to [place], Pharmacologist - DOB verified - advised of the above - reprocessed/approved with co  pay of $15.43. Brett Canales advised he will have to order cream - will be ready for pick up on Monday.  Sent patient myChart message advising of above notation.

## 2023-02-09 DIAGNOSIS — I10 Essential (primary) hypertension: Secondary | ICD-10-CM | POA: Diagnosis not present

## 2023-02-09 DIAGNOSIS — B351 Tinea unguium: Secondary | ICD-10-CM | POA: Diagnosis not present

## 2023-02-15 ENCOUNTER — Telehealth: Payer: Self-pay

## 2023-02-15 NOTE — Telephone Encounter (Signed)
Can we start a PA on Fluocinonide 0.1 % cream. [employer] sent a letter of denial.   Thank you!

## 2023-02-23 ENCOUNTER — Other Ambulatory Visit (HOSPITAL_COMMUNITY): Payer: Self-pay

## 2023-02-23 NOTE — Telephone Encounter (Signed)
  Prior authorization is not required. There is an approval letter on file for this medication effective until 07-06-2023. Patient last filled 30 grams for 14 days on 02-08-2023 so at time of message (8 days ago) this was too soon to fill. Okay to fill as of today with a co-pay of $2.81.  To prevent future delays in care, please send all messages to the Rx Prior Auth Team pool.

## 2023-02-26 ENCOUNTER — Ambulatory Visit (INDEPENDENT_AMBULATORY_CARE_PROVIDER_SITE_OTHER): Payer: Medicare HMO

## 2023-02-26 VITALS — Ht 64.0 in | Wt 215.0 lb

## 2023-02-26 DIAGNOSIS — Z0000 Encounter for general adult medical examination without abnormal findings: Secondary | ICD-10-CM | POA: Diagnosis not present

## 2023-02-26 NOTE — Progress Notes (Signed)
Because this visit was a virtual/telehealth visit,  certain criteria was not obtained, such a blood pressure, CBG if patient is a diabetic, and timed get up and go. Any medications not marked as "taking" was not mentioned during the medication reconciliation part of the visit. Any vitals not documented were not able to be obtained due to this being a telehealth visit. Vitals that have been documented are verbally provided by the patient.  Patient was unable to self-report a recent blood pressure reading due to a lack of equipment at home via telehealth.  Subjective:   Julie Brown is a 72 y.o. female who presents for Medicare Annual (Subsequent) preventive examination.  Visit Complete: Virtual  I connected with  Julie Brown on 02/26/23 by a audio enabled telemedicine application and verified that I am speaking with the correct person using two identifiers.  Patient Location: Home  Provider Location: Home Office  I discussed the limitations of evaluation and management by telemedicine. The patient expressed understanding and agreed to proceed.  Patient Medicare AWV questionnaire was completed by the patient on na; I have confirmed that all information answered by patient is correct and no changes since this date.  Review of Systems     Cardiac Risk Factors include: advanced age (>39men, >56 women);dyslipidemia;hypertension;obesity (BMI >30kg/m2)     Objective:    Today's Vitals   02/26/23 1339 02/26/23 1341  Weight: 215 lb (97.5 kg)   Height: 5\' 4"  (1.626 m)   PainSc:  4    Body mass index is 36.9 kg/m.     02/26/2023    1:38 PM 06/06/2021    1:46 PM 06/02/2021    9:10 AM 05/23/2021    2:49 PM 08/15/2020   10:27 AM 08/13/2020   10:31 AM 12/25/2015    6:40 AM  Advanced Directives  Does Patient Have a Medical Advance Directive? No Yes Yes Yes No Yes Yes  Type of Advance Directive  Living will Living will Healthcare Power of [place];Living will  Living will Living will   Does patient want to make changes to medical advance directive?  No - Patient declined       Copy of Healthcare Power of Attorney in Chart?  No - copy requested No - copy requested No - copy requested     Would patient like information on creating a medical advance directive? No - Patient declined    No - Patient declined      Current Medications (verified) Outpatient Encounter Medications as of 02/26/2023  Medication Sig   B Complex-Biotin-FA (SUPER B-COMPLEX PO) Take 1 tablet by mouth daily.    Dupilumab (DUPIXENT) 300 MG/2ML SOPN Inject 300 mg into the skin every 14 (fourteen) days.   Fluocinonide 0.1 % CREA APPLY ONCE DAILY TO AFFECTED AREA AS NEEDED FOR UP TO 2 WEEKS AT A TIME   fluticasone (FLONASE) 50 MCG/ACT nasal spray Place 2 sprays into both nostrils daily.   furosemide (LASIX) 20 MG tablet Take 1 tablet by mouth once daily   Multiple Vitamins-Calcium (ONE-A-DAY WOMENS PO) Take 1 tablet by mouth daily.    Multiple Vitamins-Minerals (ZINC PO) Take 50 mg by mouth daily.   potassium chloride (KLOR-CON) 10 MEQ tablet Take 1 tablet (10 mEq total) by mouth daily.   POTASSIUM CHLORIDE ER PO Take 1 tablet by mouth daily.   rosuvastatin (CRESTOR) 10 MG tablet Take 1 tablet by mouth once daily   terbinafine (LAMISIL) 250 MG tablet Take 250 mg by mouth daily.  triamcinolone cream (KENALOG) 0.1 % Apply 1 application  topically daily as needed (eczema).   Zinc Gluconate 10 MG LOZG    No facility-administered encounter medications on file as of 02/26/2023.    Allergies (verified) Patient has no known allergies.   History: Past Medical History:  Diagnosis Date   Eczema    Hayfever    Hyperlipidemia    Migraine headache    PONV (postoperative nausea and vomiting)    Past Surgical History:  Procedure Laterality Date   BIOPSY  08/15/2020   Procedure: BIOPSY;  Surgeon: Corbin Ade, MD;  Location: [facility];  Service: Endoscopy;;   BREAST SURGERY     lump removed   CATARACT  EXTRACTION W/PHACO Right 06/02/2021   Procedure: CATARACT EXTRACTION PHACO AND INTRAOCULAR LENS PLACEMENT RIGHT EYE;  Surgeon: Fabio Pierce, MD;  Location: [facility];  Service: Ophthalmology;  Laterality: Right;  right CDE=8.77   CATARACT EXTRACTION W/PHACO Left 06/16/2021   Procedure: CATARACT EXTRACTION LEFT EYE W/ PHACO AND INTRAOCULAR LENS PLACEMENT (IOC);  Surgeon: Fabio Pierce, MD;  Location: [facility];  Service: Ophthalmology;  Laterality: Left;  CDE: 6.86    CHOLECYSTECTOMY  01/06/2012   Procedure: LAPAROSCOPIC CHOLECYSTECTOMY;  Surgeon: Fabio Bering, MD;  Location: [facility];  Service: General;  Laterality: N/A;   COLONOSCOPY  07/22/2006   XBJ:YNWGNF and colon polyps/left-sided diverticula. adenomatous colon polyps. surveillance TCS due 2013   COLONOSCOPY N/A 12/25/2015   sigmoid diverticulosis, multiple colonic polyps removed, s/p segmental colonic biopsies. Tubular adenomas, negative microscopic colitis. Surveillance 2022.    COLONOSCOPY WITH PROPOFOL N/A 08/15/2020   Sigmoid and descending colon diverticulosis, s/p segmental biopsy. 5 year colonoscopy. Biopsy without microscopic colitis.    ESOPHAGOGASTRODUODENOSCOPY  07/22/2006   AOZ:HYQMVH/QIONG HH otherwise normal   FOOT SURGERY     left foot   KNEE SURGERY Left    MENISCUS REPAIR Right 12/28/2017   TUBAL LIGATION     Family History  Problem Relation Age of Onset   Colon cancer Mother        age 55   Kidney cancer Mother        cause of death, 42 cancer surgeries, died at age 98   Diabetes Brother    Hypertension Brother    Cancer Brother    Social History   Socioeconomic History   Marital status: Married    Spouse name: Not on file   Number of children: 1   Years of education: Not on file   Highest education level: Associate degree: academic program  Occupational History   Occupation: bookeeping   Tobacco Use   Smoking status: Former   Smokeless tobacco: Never   Tobacco comments:    quit 1990  Vaping Use    Vaping status: Never Used  Substance and Sexual Activity   Alcohol use: Not Currently    Alcohol/week: 0.0 standard drinks of alcohol    Comment: occasional, not daily   Drug use: No   Sexual activity: Not Currently    Birth control/protection: Post-menopausal  Other Topics Concern   Not on file  Social History Narrative   Not on file   Social Determinants of Health   Financial Resource Strain: Low Risk  (02/26/2023)   Overall Financial Resource Strain (CARDIA)    Difficulty of Paying Living Expenses: Not hard at all  Food Insecurity: No Food Insecurity (02/26/2023)   Hunger Vital Sign    Worried About Running Out of Food in the Last Year: Never true  Ran Out of Food in the Last Year: Never true  Transportation Needs: No Transportation Needs (02/26/2023)   PRAPARE - Administrator, Civil Service (Medical): No    Lack of Transportation (Non-Medical): No  Physical Activity: Sufficiently Active (02/26/2023)   Exercise Vital Sign    Days of Exercise per Week: 7 days    Minutes of Exercise per Session: 40 min  Stress: No Stress Concern Present (02/26/2023)   [employer] of Occupational Health - Occupational Stress Questionnaire    Feeling of Stress : Not at all  Social Connections: Socially Integrated (02/26/2023)   Social Connection and Isolation Panel [NHANES]    Frequency of Communication with Friends and Family: More than three times a week    Frequency of Social Gatherings with Friends and Family: More than three times a week    Attends Religious Services: More than 4 times per year    Active Member of [employer] or Organizations: Yes    Attends Engineer, structural: More than 4 times per year    Marital Status: Married    Tobacco Counseling Counseling given: Yes Tobacco comments: quit 1990   Clinical Intake:  Pre-visit preparation completed: Yes  Pain : 0-10 Pain Score: 4  Pain Type: Chronic pain Pain Location: Knee Pain Orientation: Right,  Left Pain Descriptors / Indicators: Aching, Constant Pain Onset: More than a month ago Pain Frequency: Constant     BMI - recorded: 36.9 Nutritional Status: BMI > 30  Obese Nutritional Risks: None Diabetes: No  How often do you need to have someone help you when you read instructions, pamphlets, or other written materials from your doctor or pharmacy?: 1 - Never  Interpreter Needed?: No  Information entered by :: Abby Neria Procter, CMA   Activities of Daily Living    02/26/2023    1:48 PM  In your present state of health, do you have any difficulty performing the following activities:  Hearing? 0  Vision? 0  Difficulty concentrating or making decisions? 0  Walking or climbing stairs? 0  Dressing or bathing? 0  Doing errands, shopping? 0  Preparing Food and eating ? N  Using the Toilet? N  In the past six months, have you accidently leaked urine? N  Do you have problems with loss of bowel control? N  Managing your Medications? N  Managing your Finances? N  Housekeeping or managing your Housekeeping? N    Patient Care Team: Tommie Sams, DO as PCP - General (Family Medicine) Janalyn Harder, MD (Inactive) as Consulting Physician (Dermatology)  Indicate any recent Medical Services you may have received from other than [facility] providers in the past year (date may be approximate).     Assessment:   This is a routine wellness examination for Julie Brown.  Hearing/Vision screen Hearing Screening - Comments:: Patient denies any hearing difficulties.   Vision Screening - Comments:: Patient states she had laser eye surgery and no longer has an eye doctor and declines referral to establish care with new provider.   Dietary issues and exercise activities discussed:     Goals Addressed             This Visit's Progress    Patient Stated       To be able to move without pain        Depression Screen    02/26/2023    1:44 PM 09/28/2022    9:41 AM 05/08/2022    1:33 PM  05/05/2022   10:11  AM 01/16/2021    1:39 PM 06/10/2020    9:42 AM  PHQ 2/9 Scores  PHQ - 2 Score 0 0 0 0 0 0  PHQ- 9 Score  0 2       Fall Risk    02/26/2023    1:48 PM 09/28/2022    9:41 AM 05/05/2022   10:11 AM 11/26/2021    8:55 AM 01/16/2021    1:39 PM  Fall Risk   Falls in the past year? 0 0 0 0 0  Number falls in past yr: 0 0 0 0   Injury with Fall? 0 0 0 0   Risk for fall due to : No Fall Risks No Fall Risks No Fall Risks No Fall Risks No Fall Risks  Follow up Falls prevention discussed Falls evaluation completed Falls evaluation completed Falls evaluation completed Falls evaluation completed    MEDICARE RISK AT HOME: Medicare Risk at Home Any stairs in or around the home?: Yes If so, are there any without handrails?: No Home free of loose throw rugs in walkways, pet beds, electrical cords, etc?: Yes Adequate lighting in your home to reduce risk of falls?: Yes Life alert?: No Use of a cane, walker or w/c?: No Grab bars in the bathroom?: No Shower chair or bench in shower?: No Elevated toilet seat or a handicapped toilet?: No  TIMED UP AND GO:  Was the test performed?  No    Cognitive Function:        02/26/2023    1:44 PM  6CIT Screen  What Year? 0 points  What month? 0 points  What time? 0 points  Count back from 20 0 points  Months in reverse 0 points  Repeat phrase 0 points  Total Score 0 points    Immunizations Immunization History  Administered Date(s) Administered   Influenza, High Dose Seasonal PF 05/05/2018, 03/31/2019, 04/08/2021   Influenza,inj,quad, With Preservative 03/06/2017, 03/06/2018   Influenza-Unspecified 03/06/2010, 03/24/2014, 02/25/2016, 02/26/2017, 05/05/2018, 04/12/2020, 05/01/2022   Moderna Covid-19 Vaccine Bivalent Booster 4yrs & up 04/25/2021   Moderna Sars-Covid-2 Vaccination 08/31/2019, 09/29/2019, 05/03/2020, 11/18/2020   PNEUMOCOCCAL CONJUGATE-20 11/26/2021   Pneumococcal Polysaccharide-23 05/01/2022   Zoster  Recombinant(Shingrix) 10/17/2019, 12/13/2019    TDAP status: Due, Education has been provided regarding the importance of this vaccine. Advised may receive this vaccine at local pharmacy or Health Dept. Aware to provide a copy of the vaccination record if obtained from local pharmacy or Health Dept. Verbalized acceptance and understanding.  Flu Vaccine status: Due, Education has been provided regarding the importance of this vaccine. Advised may receive this vaccine at local pharmacy or Health Dept. Aware to provide a copy of the vaccination record if obtained from local pharmacy or Health Dept. Verbalized acceptance and understanding.  Pneumococcal vaccine status: Up to date  Covid-19 vaccine status: Information provided on how to obtain vaccines.   Qualifies for Shingles Vaccine? No   Zostavax completed Yes   Shingrix Completed?: Yes  Screening Tests Health Maintenance  Topic Date Due   DTaP/Tdap/Td (1 - Tdap) Never done   COVID-19 Vaccine (6 - 2023-24 season) 03/06/2022   Medicare Annual Wellness (AWV)  11/27/2022   INFLUENZA VACCINE  02/04/2023   DEXA SCAN  09/01/2023   MAMMOGRAM  09/06/2024   Colonoscopy  08/15/2025   Pneumonia Vaccine 43+ Years old  Completed   Hepatitis C Screening  Completed   Zoster Vaccines- Shingrix  Completed   HPV VACCINES  Aged Out    Health Maintenance  Health Maintenance Due  Topic Date Due   DTaP/Tdap/Td (1 - Tdap) Never done   COVID-19 Vaccine (6 - 2023-24 season) 03/06/2022   Medicare Annual Wellness (AWV)  11/27/2022   INFLUENZA VACCINE  02/04/2023    Colorectal cancer screening: Type of screening: Colonoscopy. Completed 08/15/2020. Repeat every 5 years  Mammogram status: Completed 09/07/2022. Repeat every year  Bone Density status: Completed 08/31/2018. Results reflect: Bone density results: NORMAL. Repeat every 5 years.  Lung Cancer Screening: (Low Dose CT Chest recommended if Age 61-80 years, 20 pack-year currently smoking OR have  quit w/in 15years.) does not qualify.   Lung Cancer Screening Referral: na  Additional Screening:  Hepatitis C Screening: does not qualify; Completed 06/07/2017  Vision Screening: Recommended annual ophthalmology exams for early detection of glaucoma and other disorders of the eye. Is the patient up to date with their annual eye exam?  No  Who is the provider or what is the name of the office in which the patient attends annual eye exams? N/a If pt is not established with a provider, would they like to be referred to a provider to establish care? No .   Dental Screening: Recommended annual dental exams for proper oral hygiene  Diabetic Foot Exam: na  Community Resource Referral / Chronic Care Management: CRR required this visit?  No   CCM required this visit?  No     Plan:     I have personally reviewed and noted the following in the patient's chart:   Medical and social history Use of alcohol, tobacco or illicit drugs  Current medications and supplements including opioid prescriptions. Patient is not currently taking opioid prescriptions. Functional ability and status Nutritional status Physical activity Advanced directives List of other physicians Hospitalizations, surgeries, and ER visits in previous 12 months Vitals Screenings to include cognitive, depression, and falls Referrals and appointments  In addition, I have reviewed and discussed with patient certain preventive protocols, quality metrics, and best practice recommendations. A written personalized care plan for preventive services as well as general preventive health recommendations were provided to patient.     Jordan Hawks Nathan Moct[place], CMA   02/26/2023   After Visit Summary: (MyChart) Due to this being a telephonic visit, the after visit summary with patients personalized plan was offered to patient via MyChart   Nurse Notes:

## 2023-02-26 NOTE — Patient Instructions (Signed)
Julie Brown , Thank you for taking time to come for your Medicare Wellness Visit. I appreciate your ongoing commitment to your health goals. Please review the following plan we discussed and let me know if I can assist you in the future.   Referrals/Orders/Follow-Ups/Clinician Recommendations:   This is a list of the screening recommended for you and due dates:  Health Maintenance  Topic Date Due   DTaP/Tdap/Td vaccine (1 - Tdap) Never done   COVID-19 Vaccine (6 - 2023-24 season) 03/06/2022   Flu Shot  02/04/2023   DEXA scan (bone density measurement)  09/01/2023   Mammogram  09/07/2023   Medicare Annual Wellness Visit  02/26/2024   Colon Cancer Screening  08/15/2025   Pneumonia Vaccine  Completed   Hepatitis C Screening  Completed   Zoster (Shingles) Vaccine  Completed   HPV Vaccine  Aged Out    Advanced directives: (Declined) Advance directive discussed with you today. Even though you declined this today, please call our office should you change your mind, and we can give you the proper paperwork for you to fill out.  Next Medicare Annual Wellness Visit scheduled for next year: [facility] 39 Years and Older, Female Preventive care refers to lifestyle choices and visits with your health care provider that can promote health and wellness. Preventive care visits are also called wellness exams. What can I expect for my preventive care visit? Counseling Your health care provider may ask you questions about your: Medical history, including: Past medical problems. Family medical history. Pregnancy and menstrual history. History of falls. Current health, including: Memory and ability to understand (cognition). Emotional well-being. Home life and relationship well-being. Sexual activity and sexual health. Lifestyle, including: Alcohol, nicotine or tobacco, and drug use. Access to firearms. Diet, exercise, and sleep habits. Work and work Astronomer. Sunscreen use. Safety  issues such as seatbelt and bike helmet use. Physical exam Your health care provider will check your: Height and weight. These may be used to calculate your BMI (body mass index). BMI is a measurement that tells if you are at a healthy weight. Waist circumference. This measures the distance around your waistline. This measurement also tells if you are at a healthy weight and may help predict your risk of certain diseases, such as type 2 diabetes and high blood pressure. Heart rate and blood pressure. Body temperature. Skin for abnormal spots. What immunizations do I need?  Vaccines are usually given at various ages, according to a schedule. Your health care provider will recommend vaccines for you based on your age, medical history, and lifestyle or other factors, such as travel or where you work. What tests do I need? Screening Your health care provider may recommend screening tests for certain conditions. This may include: Lipid and cholesterol levels. Hepatitis C test. Hepatitis B test. HIV (human immunodeficiency virus) test. STI (sexually transmitted infection) testing, if you are at risk. Lung cancer screening. Colorectal cancer screening. Diabetes screening. This is done by checking your blood sugar (glucose) after you have not eaten for a while (fasting). Mammogram. Talk with your health care provider about how often you should have regular mammograms. BRCA-related cancer screening. This may be done if you have a family history of breast, ovarian, tubal, or peritoneal cancers. Bone density scan. This is done to screen for osteoporosis. Talk with your health care provider about your test results, treatment options, and if necessary, the need for more tests. Follow these instructions at home: Eating and drinking  Eat a diet  that includes fresh fruits and vegetables, whole grains, lean protein, and low-fat dairy products. Limit your intake of foods with high amounts of sugar,  saturated fats, and salt. Take vitamin and mineral supplements as recommended by your health care provider. Do not drink alcohol if your health care provider tells you not to drink. If you drink alcohol: Limit how much you have to 0-1 drink a day. Know how much alcohol is in your drink. In the [place] one drink equals one 12 oz bottle of beer (355 mL), one 5 oz glass of wine (148 mL), or one 1 oz glass of hard liquor (44 mL). Lifestyle Brush your teeth every morning and night with fluoride toothpaste. Floss one time each day. Exercise for at least 30 minutes 5 or more days each week. Do not use any products that contain nicotine or tobacco. These products include cigarettes, chewing tobacco, and vaping devices, such as e-cigarettes. If you need help quitting, ask your health care provider. Do not use drugs. If you are sexually active, practice safe sex. Use a condom or other form of protection in order to prevent STIs. Take aspirin only as told by your health care provider. Make sure that you understand how much to take and what form to take. Work with your health care provider to find out whether it is safe and beneficial for you to take aspirin daily. Ask your health care provider if you need to take a cholesterol-lowering medicine (statin). Find healthy ways to manage stress, such as: Meditation, yoga, or listening to music. Journaling. Talking to a trusted person. Spending time with friends and family. Minimize exposure to UV radiation to reduce your risk of skin cancer. Safety Always wear your seat belt while driving or riding in a vehicle. Do not drive: If you have been drinking alcohol. Do not ride with someone who has been drinking. When you are tired or distracted. While texting. If you have been using any mind-altering substances or drugs. Wear a helmet and other protective equipment during sports activities. If you have firearms in your house, make sure you follow all gun safety  procedures. What's next? Visit your health care provider once a year for an annual wellness visit. Ask your health care provider how often you should have your eyes and teeth checked. Stay up to date on all vaccines. This information is not intended to replace advice given to you by your health care provider. Make sure you discuss any questions you have with your health care provider. Document Revised: 12/18/2020 Document Reviewed: 12/18/2020 Elsevier Patient Education  2024 [employer]. Understanding Your Risk for Falls Millions of people have serious injuries from falls each year. It is important to understand your risk of falling. Talk with your health care provider about your risk and what you can do to lower it. If you do have a serious fall, make sure to tell your provider. Falling once raises your risk of falling again. How can falls affect me? Serious injuries from falls are common. These include: Broken bones, such as hip fractures. Head injuries, such as traumatic brain injuries (TBI) or concussions. A fear of falling can cause you to avoid activities and stay at home. This can make your muscles weaker and raise your risk for a fall. What can increase my risk? There are a number of risk factors that increase your risk for falling. The more risk factors you have, the higher your risk of falling. Serious injuries from a fall happen most  often to people who are older than 70 years old. Teenagers and young adults ages 37-29 are also at higher risk. Common risk factors include: Weakness in the lower body. Being generally weak or confused due to long-term (chronic) illness. Dizziness or balance problems. Poor vision. Medicines that cause dizziness or drowsiness. These may include: Medicines for your blood pressure, heart, anxiety, insomnia, or swelling (edema). Pain medicines. Muscle relaxants. Other risk factors include: Drinking alcohol. Having had a fall in the past. Having  foot pain or wearing improper footwear. Working at a dangerous job. Having any of the following in your home: Tripping hazards, such as floor clutter or loose rugs. Poor lighting. Pets. Having dementia or memory loss. What actions can I take to lower my risk of falling?     Physical activity Stay physically fit. Do strength and balance exercises. Consider taking a regular class to build strength and balance. Yoga and tai chi are good options. Vision Have your eyes checked every year and your prescription for glasses or contacts updated as needed. Shoes and walking aids Wear non-skid shoes. Wear shoes that have rubber soles and low heels. Do not wear high heels. Do not walk around the house in socks or slippers. Use a cane or walker as told by your provider. Home safety Attach secure railings on both sides of your stairs. Install grab bars for your bathtub, shower, and toilet. Use a non-skid mat in your bathtub or shower. Attach bath mats securely with double-sided, non-slip rug tape. Use good lighting in all rooms. Keep a flashlight near your bed. Make sure there is a clear path from your bed to the bathroom. Use night-lights. Do not use throw rugs. Make sure all carpeting is taped or tacked down securely. Remove all clutter from walkways and stairways, including extension cords. Repair uneven or broken steps and floors. Avoid walking on icy or slippery surfaces. Walk on the grass instead of on icy or slick sidewalks. Use ice melter to get rid of ice on walkways in the winter. Use a cordless phone. Questions to ask your health care provider Can you help me check my risk for a fall? Do any of my medicines make me more likely to fall? Should I take a vitamin D supplement? What exercises can I do to improve my strength and balance? Should I make an appointment to have my vision checked? Do I need a bone density test to check for weak bones (osteoporosis)? Would it help to use a  cane or a walker? Where to find more information Centers for Disease Control and Prevention, STEADI: TonerPromos.no Community-Based Fall Prevention Programs: TonerPromos.no [employer] on Aging: BaseRingTones.pl Contact a health care provider if: You fall at home. You are afraid of falling at home. You feel weak, drowsy, or dizzy. This information is not intended to replace advice given to you by your health care provider. Make sure you discuss any questions you have with your health care provider. Document Revised: 02/23/2022 Document Reviewed: 02/23/2022 Elsevier Patient Education  2024 [employer].

## 2023-03-02 MED ORDER — FLUOCINONIDE 0.1 % EX CREA: TOPICAL_CREAM | CUTANEOUS | 2 refills | Status: DC

## 2023-03-02 NOTE — Telephone Encounter (Signed)
Pharmacy notified.

## 2023-03-02 NOTE — Addendum Note (Signed)
Addended by: Berna Bue on: 03/02/2023 02:05 PM   Modules accepted: Orders

## 2023-03-09 DIAGNOSIS — B351 Tinea unguium: Secondary | ICD-10-CM | POA: Diagnosis not present

## 2023-03-09 NOTE — Telephone Encounter (Signed)
Called and left a message for patient to call our office to inform her that she can pick up her cream from the pharmacy. If she has any issues please contact our office once again.

## 2023-03-10 ENCOUNTER — Other Ambulatory Visit: Payer: Self-pay | Admitting: Family Medicine

## 2023-03-10 DIAGNOSIS — B351 Tinea unguium: Secondary | ICD-10-CM | POA: Diagnosis not present

## 2023-03-10 DIAGNOSIS — R600 Localized edema: Secondary | ICD-10-CM

## 2023-04-13 DIAGNOSIS — B351 Tinea unguium: Secondary | ICD-10-CM | POA: Diagnosis not present

## 2023-05-01 ENCOUNTER — Other Ambulatory Visit: Payer: Self-pay | Admitting: Family Medicine

## 2023-06-04 DIAGNOSIS — J101 Influenza due to other identified influenza virus with other respiratory manifestations: Secondary | ICD-10-CM | POA: Diagnosis not present

## 2023-06-04 DIAGNOSIS — J019 Acute sinusitis, unspecified: Secondary | ICD-10-CM | POA: Diagnosis not present

## 2023-06-04 DIAGNOSIS — U071 COVID-19: Secondary | ICD-10-CM | POA: Diagnosis not present

## 2023-06-08 DIAGNOSIS — B351 Tinea unguium: Secondary | ICD-10-CM | POA: Diagnosis not present

## 2023-06-14 ENCOUNTER — Encounter: Payer: Self-pay | Admitting: Physician Assistant

## 2023-06-14 ENCOUNTER — Encounter: Payer: Self-pay | Admitting: Family Medicine

## 2023-06-14 ENCOUNTER — Ambulatory Visit: Payer: Medicare HMO | Admitting: Physician Assistant

## 2023-06-14 VITALS — BP 105/69 | HR 88 | Temp 97.3°F | Wt 229.0 lb

## 2023-06-14 DIAGNOSIS — J0111 Acute recurrent frontal sinusitis: Secondary | ICD-10-CM

## 2023-06-14 MED ORDER — DOXYCYCLINE HYCLATE 100 MG PO TABS
100.0000 mg | ORAL_TABLET | Freq: Two times a day (BID) | ORAL | 0 refills | Status: AC
Start: 2023-06-14 — End: 2023-06-21

## 2023-06-14 NOTE — Progress Notes (Signed)
Established Patient Office Visit  Subjective   Patient ID: Julie Brown, female    DOB: 1950/10/19  Age: 72 y.o. MRN: [id number]  Chief Complaint  Patient presents with   Sinus Problem    [facility] follow up take augmentin 10 day    Patient presents today for follow-up of sinus congestion and headache.  Patient was seen in urgent care on 11/29, prescribed Augmentin for acute sinusitis.  She states symptoms have gotten better over the course of the last week and a half, however she is still experiencing congestion, headache, hoarse voice, fatigue.  She states she has been taking over-the-counter DayQuil/NyQuil, ibuprofen, Flonase with mild improvement of symptoms.  She had an ENT visit in the spring for workup of recurrent sinusitis.  CT scan then showed no significant sinus pathology.     Review of Systems  Constitutional:  Negative for fever.  HENT:  Negative for congestion, ear pain and sinus pain.   Respiratory:  Positive for cough. Negative for shortness of breath and wheezing.   All other systems reviewed and are negative.     Objective:     BP 105/69   Pulse 88   Temp (!) 97.3 F (36.3 C)   Wt 229 lb (103.9 kg)   SpO2 98%   BMI 39.31 kg/m    Physical Exam Constitutional:      General: She is not in acute distress.    Appearance: Normal appearance.  HENT:     Head: Normocephalic.     Right Ear: Tympanic membrane normal.     Left Ear: Tympanic membrane normal.     Nose: Congestion present.     Mouth/Throat:     Mouth: Mucous membranes are moist.     Pharynx: Oropharynx is clear. Posterior oropharyngeal erythema present.  Eyes:     Extraocular Movements: Extraocular movements intact.  Cardiovascular:     Pulses: Normal pulses.     Heart sounds: Normal heart sounds. No murmur heard.    No friction rub. No gallop.  Pulmonary:     Effort: Pulmonary effort is normal.     Breath sounds: Normal breath sounds. No stridor. No wheezing, rhonchi or rales.   Musculoskeletal:        General: Normal range of motion.  Skin:    General: Skin is warm.  Neurological:     General: No focal deficit present.     Mental Status: She is alert and oriented to person, place, and time.  Psychiatric:        Mood and Affect: Mood normal.        Behavior: Behavior normal.      No results found for any visits on 06/14/23.  The 10-year ASCVD risk score (Arnett DK, et al., 2019) is: 8.9%    Assessment & Plan:   Return if symptoms worsen or fail to improve.   Acute recurrent frontal sinusitis -     Doxycycline Hyclate; Take 1 tablet (100 mg total) by mouth 2 (two) times daily for 7 days.  Dispense: 14 tablet; Refill: 0  Patient overall improving since onset of symptoms. She has finished course of Augmentin. Exam rather benign, lungs clear to auscultation bilaterally, frontal sinus tenderness is present today.  Case discussed with supervising physician Dr. Adriana Simas on whether continued antibiotic therapy is necessary, as patient reports long history of needing 2 courses of antibiotics to clear her sinus infections. She was persistent for continued antibiotic therapy, even after discussion of risks of overuse  of antibiotics.  Patient was strongly encouraged to follow-up with ENT for these problems.  Toni Amend Darthy Manganelli, PA-C

## 2023-06-14 NOTE — Telephone Encounter (Signed)
Cook, Jayce G, DO    Needs visit.   

## 2023-06-15 ENCOUNTER — Other Ambulatory Visit: Payer: Self-pay | Admitting: Family Medicine

## 2023-06-15 DIAGNOSIS — R600 Localized edema: Secondary | ICD-10-CM

## 2023-07-07 ENCOUNTER — Other Ambulatory Visit: Payer: Self-pay | Admitting: *Deleted

## 2023-07-07 DIAGNOSIS — L2084 Intrinsic (allergic) eczema: Secondary | ICD-10-CM

## 2023-07-07 MED ORDER — DUPIXENT 300 MG/2ML ~~LOC~~ SOAJ
300.0000 mg | SUBCUTANEOUS | 3 refills | Status: DC
Start: 2023-07-07 — End: 2024-02-17

## 2023-07-14 ENCOUNTER — Ambulatory Visit: Payer: Medicare HMO | Admitting: Family Medicine

## 2023-07-14 VITALS — BP 129/79 | HR 78 | Temp 98.7°F | Ht 64.0 in | Wt 232.4 lb

## 2023-07-14 DIAGNOSIS — J329 Chronic sinusitis, unspecified: Secondary | ICD-10-CM

## 2023-07-14 MED ORDER — LEVOFLOXACIN 750 MG PO TABS: 750.0000 mg | ORAL_TABLET | Freq: Every day | ORAL | 0 refills | Status: DC

## 2023-07-14 MED ORDER — PREDNISONE 10 MG PO TABS: ORAL_TABLET | ORAL | 0 refills | Status: DC

## 2023-07-14 NOTE — Patient Instructions (Signed)
 Mediations as prescribed.  If doesn't improve, recommend re-evaluation by ENT.

## 2023-07-14 NOTE — Progress Notes (Signed)
 Subjective:  Patient ID: Julie Brown, female    DOB: 11-29-1950  Age: 73 y.o. MRN: [id number]  CC:   Chief Complaint  Patient presents with   Acute Visit    Head congestion, headache, nose blowing, no temperature, but some diarrhea, and now a fever blister     HPI:  73 year old female presents with persistent respiratory symptoms.  Most recently seen on 12/9.  Was placed on doxycycline  for sinusitis.  Reports continued symptoms.  She will cough, severe congestion, sore throat, sinus pressure and pain.  She states that she had no relief with antibiotic therapy.  She has been using Zyrtec and Flonase  without relief.  Previously seen by ENT with unremarkable CT sinus.  No fever.  Patient Active Problem List   Diagnosis Date Noted   Preoperative examination 09/28/2022   Recurrent sinusitis 07/28/2022   Lichen sclerosus et atrophicus of the vulva 04/04/2021   Obesity (BMI 35.0-39.9 without comorbidity) 01/16/2021   Hyperlipidemia LDL goal <130 01/16/2021   Seasonal allergic rhinitis due to pollen 10/22/2020   Subclinical hypothyroidism 06/17/2020   FH: colon cancer in relative <36 years old 12/03/2015   Hepatic steatosis 02/12/2009    Social Hx   Social History   Socioeconomic History   Marital status: Married    Spouse name: Not on file   Number of children: 1   Years of education: Not on file   Highest education level: Associate degree: academic program  Occupational History   Occupation: bookeeping   Tobacco Use   Smoking status: Former   Smokeless tobacco: Never   Tobacco comments:    quit 1990  Vaping Use   Vaping status: Never Used  Substance and Sexual Activity   Alcohol use: Not Currently    Alcohol/week: 0.0 standard drinks of alcohol    Comment: occasional, not daily   Drug use: No   Sexual activity: Not Currently    Birth control/protection: Post-menopausal  Other Topics Concern   Not on file  Social History Narrative   Not on file   Social Drivers  of Health   Financial Resource Strain: Low Risk  (07/14/2023)   Overall Financial Resource Strain (CARDIA)    Difficulty of Paying Living Expenses: Not hard at all  Food Insecurity: No Food Insecurity (07/14/2023)   Hunger Vital Sign    Worried About Running Out of Food in the Last Year: Never true    Ran Out of Food in the Last Year: Never true  Transportation Needs: No Transportation Needs (07/14/2023)   PRAPARE - Administrator, Civil Service (Medical): No    Lack of Transportation (Non-Medical): No  Physical Activity: Inactive (07/14/2023)   Exercise Vital Sign    Days of Exercise per Week: 0 days    Minutes of Exercise per Session: 40 min  Stress: No Stress Concern Present (07/14/2023)   [employer] of Occupational Health - Occupational Stress Questionnaire    Feeling of Stress : Not at all  Social Connections: Socially Integrated (07/14/2023)   Social Connection and Isolation Panel [NHANES]    Frequency of Communication with Friends and Family: More than three times a week    Frequency of Social Gatherings with Friends and Family: Once a week    Attends Religious Services: More than 4 times per year    Active Member of [employer] or Organizations: Yes    Attends Engineer, Structural: More than 4 times per year    Marital Status: Married  Review of Systems Per HPI  Objective:  BP 129/79   Pulse 78   Temp 98.7 F (37.1 C)   Ht 5' 4 (1.626 m)   Wt 232 lb 6.4 oz (105.4 kg)   SpO2 97%   BMI 39.89 kg/m      07/14/2023    4:04 PM 06/14/2023    3:06 PM 02/26/2023    1:39 PM  BP/Weight  Systolic BP 129 105 --  Diastolic BP 79 69 --  Wt. (Lbs) 232.4 229 215  BMI 39.89 kg/m2 39.31 kg/m2 36.9 kg/m2    Physical Exam Constitutional:      General: She is not in acute distress.    Appearance: Normal appearance.  HENT:     Head: Normocephalic and atraumatic.     Right Ear: Tympanic membrane normal.     Left Ear: Tympanic membrane normal.     Nose:  Congestion present.     Mouth/Throat:     Pharynx: Oropharynx is clear.  Cardiovascular:     Rate and Rhythm: Normal rate and regular rhythm.  Pulmonary:     Effort: Pulmonary effort is normal.     Breath sounds: Normal breath sounds. No wheezing, rhonchi or rales.  Neurological:     Mental Status: She is alert.     Lab Results  Component Value Date   WBC 5.4 11/26/2021   HGB 14.8 11/26/2021   HCT 43.8 11/26/2021   PLT 254 11/26/2021   GLUCOSE 104 (H) 11/26/2021   CHOL 240 (H) 11/26/2021   TRIG 146 11/26/2021   HDL 62 11/26/2021   LDLCALC 152 (H) 11/26/2021   ALT 22 11/26/2021   AST 17 11/26/2021   NA 141 11/26/2021   K 4.3 11/26/2021   CL 105 11/26/2021   CREATININE 0.82 11/26/2021   BUN 17 11/26/2021   CO2 22 11/26/2021   TSH 7.120 (H) 06/10/2020   HGBA1C 5.2 11/26/2021     Assessment & Plan:   Problem List Items Addressed This Visit       Respiratory   Recurrent sinusitis - Primary   Patient exhibiting signs and symptoms of recurrent sinusitis.  She has had no medical improvement.  Placing on Levaquin  and prednisone .  If fails to improve or worsens, will need to see ENT.      Relevant Medications   levofloxacin  (LEVAQUIN ) 750 MG tablet   predniSONE  (DELTASONE ) 10 MG tablet    Meds ordered this encounter  Medications   levofloxacin  (LEVAQUIN ) 750 MG tablet    Sig: Take 1 tablet (750 mg total) by mouth daily.    Dispense:  7 tablet    Refill:  0   predniSONE  (DELTASONE ) 10 MG tablet    Sig: 50 mg daily x 2 days, then 40 mg daily x 2 days, then 30 mg daily x 2 days, then 20 mg daily x 2 days, then 10 mg daily x 2 days.    Dispense:  30 tablet    Refill:  0    Follow-up:  Return if symptoms worsen or fail to improve.  Jacqulyn Ahle DO [facility]

## 2023-07-14 NOTE — Assessment & Plan Note (Signed)
 Patient exhibiting signs and symptoms of recurrent sinusitis.  She has had no medical improvement.  Placing on Levaquin and prednisone.  If fails to improve or worsens, will need to see ENT.

## 2023-08-17 DIAGNOSIS — B351 Tinea unguium: Secondary | ICD-10-CM | POA: Diagnosis not present

## 2023-08-28 DIAGNOSIS — Z8249 Family history of ischemic heart disease and other diseases of the circulatory system: Secondary | ICD-10-CM | POA: Diagnosis not present

## 2023-08-28 DIAGNOSIS — M1990 Unspecified osteoarthritis, unspecified site: Secondary | ICD-10-CM | POA: Diagnosis not present

## 2023-08-28 DIAGNOSIS — R32 Unspecified urinary incontinence: Secondary | ICD-10-CM | POA: Diagnosis not present

## 2023-08-28 DIAGNOSIS — Z809 Family history of malignant neoplasm, unspecified: Secondary | ICD-10-CM | POA: Diagnosis not present

## 2023-08-28 DIAGNOSIS — E876 Hypokalemia: Secondary | ICD-10-CM | POA: Diagnosis not present

## 2023-08-28 DIAGNOSIS — Z87891 Personal history of nicotine dependence: Secondary | ICD-10-CM | POA: Diagnosis not present

## 2023-08-28 DIAGNOSIS — I129 Hypertensive chronic kidney disease with stage 1 through stage 4 chronic kidney disease, or unspecified chronic kidney disease: Secondary | ICD-10-CM | POA: Diagnosis not present

## 2023-08-28 DIAGNOSIS — Z79899 Other long term (current) drug therapy: Secondary | ICD-10-CM | POA: Diagnosis not present

## 2023-08-28 DIAGNOSIS — Z833 Family history of diabetes mellitus: Secondary | ICD-10-CM | POA: Diagnosis not present

## 2023-08-28 DIAGNOSIS — E785 Hyperlipidemia, unspecified: Secondary | ICD-10-CM | POA: Diagnosis not present

## 2023-08-28 DIAGNOSIS — L309 Dermatitis, unspecified: Secondary | ICD-10-CM | POA: Diagnosis not present

## 2023-09-08 ENCOUNTER — Other Ambulatory Visit (HOSPITAL_COMMUNITY): Payer: Self-pay | Admitting: Obstetrics & Gynecology

## 2023-09-08 DIAGNOSIS — Z1231 Encounter for screening mammogram for malignant neoplasm of breast: Secondary | ICD-10-CM

## 2023-09-11 ENCOUNTER — Other Ambulatory Visit: Payer: Self-pay | Admitting: Family Medicine

## 2023-09-11 DIAGNOSIS — R600 Localized edema: Secondary | ICD-10-CM

## 2023-09-20 ENCOUNTER — Ambulatory Visit (HOSPITAL_COMMUNITY)
Admission: RE | Admit: 2023-09-20 | Discharge: 2023-09-20 | Disposition: A | Discharge status: Home / Self Care | Source: Ambulatory Visit | Attending: Obstetrics & Gynecology | Admitting: Obstetrics & Gynecology

## 2023-09-20 DIAGNOSIS — Z1231 Encounter for screening mammogram for malignant neoplasm of breast: Secondary | ICD-10-CM | POA: Insufficient documentation

## 2023-09-23 ENCOUNTER — Encounter: Payer: Self-pay | Admitting: Obstetrics & Gynecology

## 2023-10-28 ENCOUNTER — Ambulatory Visit: Admitting: Family Medicine

## 2023-10-28 ENCOUNTER — Encounter: Payer: Self-pay | Admitting: Family Medicine

## 2023-10-28 VITALS — BP 126/75 | HR 97 | Temp 98.0°F | Ht 64.0 in | Wt 235.0 lb

## 2023-10-28 DIAGNOSIS — H0015 Chalazion left lower eyelid: Secondary | ICD-10-CM

## 2023-10-28 MED ORDER — MOXIFLOXACIN HCL 0.5 % OP SOLN: 1.0000 [drp] | Freq: Three times a day (TID) | OPHTHALMIC | 0 refills | Status: AC

## 2023-10-28 NOTE — Patient Instructions (Signed)
 Warm compresses for 5-10 mins 4 times daily.  If continues to persist, contact [facility] in [place].

## 2023-10-29 DIAGNOSIS — H0019 Chalazion unspecified eye, unspecified eyelid: Secondary | ICD-10-CM | POA: Insufficient documentation

## 2023-10-29 NOTE — Assessment & Plan Note (Signed)
 Warm compresses.  Vigamox  as prescribed.  If continues to persist advise follow-up with ophthalmology for consideration for incision and drainage.

## 2023-10-29 NOTE — Progress Notes (Signed)
 Subjective:  Patient ID: Julie Brown, female    DOB: 1951-02-08  Age: 73 y.o. MRN: [id number]  CC:   Chief Complaint  Patient presents with   Facial Swelling    Left eye swelling X's 5 days, painful when blinking    HPI:  73 year old female presents for evaluation of the above.  Patient reports swelling of the left lower eyelid for the past 5 days.  She has been using warm compresses without resolution.  Mild discomfort.  No vision changes.  No other associated symptoms.  No other complaints.  Patient Active Problem List   Diagnosis Date Noted   Chalazion 10/29/2023   Preoperative examination 09/28/2022   Recurrent sinusitis 07/28/2022   Lichen sclerosus et atrophicus of the vulva 04/04/2021   Obesity (BMI 35.0-39.9 without comorbidity) 01/16/2021   Hyperlipidemia LDL goal <130 01/16/2021   Seasonal allergic rhinitis due to pollen 10/22/2020   Subclinical hypothyroidism 06/17/2020   FH: colon cancer in relative <97 years old 12/03/2015   Hepatic steatosis 02/12/2009    Social Hx   Social History   Socioeconomic History   Marital status: Married    Spouse name: Not on file   Number of children: 1   Years of education: Not on file   Highest education level: Associate degree: academic program  Occupational History   Occupation: bookeeping   Tobacco Use   Smoking status: Former   Smokeless tobacco: Never   Tobacco comments:    quit 1990  Vaping Use   Vaping status: Never Used  Substance and Sexual Activity   Alcohol use: Not Currently    Alcohol/week: 0.0 standard drinks of alcohol    Comment: occasional, not daily   Drug use: No   Sexual activity: Not Currently    Birth control/protection: Post-menopausal  Other Topics Concern   Not on file  Social History Narrative   Not on file   Social Drivers of Health   Financial Resource Strain: Low Risk  (07/14/2023)   Overall Financial Resource Strain (CARDIA)    Difficulty of Paying Living Expenses: Not hard  at all  Food Insecurity: No Food Insecurity (07/14/2023)   Hunger Vital Sign    Worried About Running Out of Food in the Last Year: Never true    Ran Out of Food in the Last Year: Never true  Transportation Needs: No Transportation Needs (07/14/2023)   PRAPARE - Administrator, Civil Service (Medical): No    Lack of Transportation (Non-Medical): No  Physical Activity: Inactive (07/14/2023)   Exercise Vital Sign    Days of Exercise per Week: 0 days    Minutes of Exercise per Session: 40 min  Stress: No Stress Concern Present (07/14/2023)   [employer] of Occupational Health - Occupational Stress Questionnaire    Feeling of Stress : Not at all  Social Connections: Socially Integrated (07/14/2023)   Social Connection and Isolation Panel [NHANES]    Frequency of Communication with Friends and Family: More than three times a week    Frequency of Social Gatherings with Friends and Family: Once a week    Attends Religious Services: More than 4 times per year    Active Member of [employer] or Organizations: Yes    Attends Engineer, structural: More than 4 times per year    Marital Status: Married    Review of Systems Per HPI  Objective:  BP 126/75   Pulse 97   Temp 98 F (36.7 C)  Ht 5\' 4"  (1.626 m)   Wt 235 lb (106.6 kg)   SpO2 100%   BMI 40.34 kg/m      10/28/2023    1:51 PM 07/14/2023    4:04 PM 06/14/2023    3:06 PM  BP/Weight  Systolic BP 126 129 105  Diastolic BP 75 79 69  Wt. (Lbs) 235 232.4 229  BMI 40.34 kg/m2 39.89 kg/m2 39.31 kg/m2    Physical Exam Constitutional:      General: She is not in acute distress.    Appearance: Normal appearance.  HENT:     Head: Normocephalic and atraumatic.  Eyes:     Comments: Left lower eyelid with lesion noted on the inner aspect of the eyelid.  Neurological:     Mental Status: She is alert.     Lab Results  Component Value Date   WBC 5.4 11/26/2021   HGB 14.8 11/26/2021   HCT 43.8 11/26/2021   PLT 254  11/26/2021   GLUCOSE 104 (H) 11/26/2021   CHOL 240 (H) 11/26/2021   TRIG 146 11/26/2021   HDL 62 11/26/2021   LDLCALC 152 (H) 11/26/2021   ALT 22 11/26/2021   AST 17 11/26/2021   NA 141 11/26/2021   K 4.3 11/26/2021   CL 105 11/26/2021   CREATININE 0.82 11/26/2021   BUN 17 11/26/2021   CO2 22 11/26/2021   TSH 7.120 (H) 06/10/2020   HGBA1C 5.2 11/26/2021     Assessment & Plan:  Chalazion of left lower eyelid Assessment & Plan: Warm compresses.  Vigamox  as prescribed.  If continues to persist advise follow-up with ophthalmology for consideration for incision and drainage.   Other orders -     Moxifloxacin  HCl; Place 1 drop into both eyes 3 (three) times daily for 7 days.  Dispense: 3 mL; Refill: 0    Follow-up:  Return if symptoms worsen or fail to improve.  Kathleen Papa DO [facility]

## 2023-11-16 DIAGNOSIS — M79674 Pain in right toe(s): Secondary | ICD-10-CM | POA: Diagnosis not present

## 2023-11-16 DIAGNOSIS — B351 Tinea unguium: Secondary | ICD-10-CM | POA: Diagnosis not present

## 2023-11-16 DIAGNOSIS — M79675 Pain in left toe(s): Secondary | ICD-10-CM | POA: Diagnosis not present

## 2023-12-09 ENCOUNTER — Other Ambulatory Visit: Payer: Self-pay | Admitting: Family Medicine

## 2023-12-09 DIAGNOSIS — R600 Localized edema: Secondary | ICD-10-CM

## 2023-12-23 DIAGNOSIS — B351 Tinea unguium: Secondary | ICD-10-CM | POA: Diagnosis not present

## 2024-02-04 ENCOUNTER — Ambulatory Visit: Admitting: Family Medicine

## 2024-02-04 ENCOUNTER — Encounter: Payer: Self-pay | Admitting: Family Medicine

## 2024-02-04 VITALS — BP 122/70 | HR 101 | Temp 97.9°F | Resp 16 | Ht 63.19 in | Wt 232.1 lb

## 2024-02-04 DIAGNOSIS — J302 Other seasonal allergic rhinitis: Secondary | ICD-10-CM

## 2024-02-04 DIAGNOSIS — L988 Other specified disorders of the skin and subcutaneous tissue: Secondary | ICD-10-CM | POA: Insufficient documentation

## 2024-02-04 DIAGNOSIS — L2089 Other atopic dermatitis: Secondary | ICD-10-CM | POA: Diagnosis not present

## 2024-02-04 DIAGNOSIS — J3089 Other allergic rhinitis: Secondary | ICD-10-CM

## 2024-02-04 MED ORDER — FLUOCINONIDE 0.1 % EX CREA: 1.0000 | TOPICAL_CREAM | Freq: Every day | CUTANEOUS | 3 refills | Status: DC | PRN

## 2024-02-04 NOTE — Progress Notes (Signed)
 [address] [place] [address] Dept: [phone]  FOLLOW UP NOTE  Patient ID: Julie Brown, female    DOB: 16-Jan-1951  Age: 73 y.o. MRN: [id number] Date of Office Visit: 02/04/2024  Assessment  Chief Complaint: Follow-up (Dupixent  reapproval )  HPI Julie Brown is a 73 year old female who presents to the clinic for a follow-up visit.  She was last seen in this clinic on 02/03/2023 by Dr. Iva for evaluation of allergic rhinitis, atopic dermatitis, and possible allergic contact dermatitis with negative patch testing.   At today's visit, she reports her allergic rhinitis has been moderately well-controlled with nasal congestion and postnasal drainage as the main symptoms.  She denies clear rhinorrhea or sneezing.  She continues cetirizine 10 mg once a day and Flonase  as needed.  She reports she is not currently using nasal saline rinses.  Her last environmental allergy  skin testing on 11/06/2022 was positive to grass pollen, ragweed pollen, mold, cat, and dust mite.  Atopic dermatitis is reported as well-controlled with only infrequent red and itchy areas occurring in a flare in remission pattern.  She continues a daily moisturizing routine and rarely needs to use topical medicated treatments.  She does report that since she has been more active and working out she has developed some raised, itchy areas on the back of her scalp for which is fluocinonide  solution with relief of symptoms.  She continues self-administered Dupixent  pen 300 mg once every 2 weeks with no large or local reactions.  She reports a significant decrease in her symptoms of atopic dermatitis while continuing on Dupixent  injections.  She reports that she has not had any further episodes of contact dermatitis.  She does mention that she has a skin growth/lesion on her right upper breast.  She reports that she noticed this about 1 year ago and it has progressively gotten larger.  She reports that it  has been itchy and grown quite a bit bigger recently.  She denies any bleeding from this area.  She denies any pain in this area.  She does have a similar lesion on her right eye which is not causing any adverse effects at this time.  Her current medications are listed in the chart.  Drug Allergies:  No Known Allergies  Physical Exam: BP 122/70   Pulse (!) 101   Temp 97.9 F (36.6 C)   Resp 16   Ht 5' 3.19 (1.605 m)   Wt 232 lb 2 oz (105.3 kg)   SpO2 95%   BMI 40.87 kg/m    Physical Exam Vitals reviewed.  Constitutional:      Appearance: Normal appearance.  HENT:     Head: Normocephalic and atraumatic.     Right Ear: Tympanic membrane normal.     Left Ear: Tympanic membrane normal.     Nose:     Comments: Bilateral naris slightly erythematous with thin clear nasal drainage noted.  Pharynx normal.  Ears normal.  Eyes normal.    Mouth/Throat:     Pharynx: Oropharynx is clear.  Eyes:     Conjunctiva/sclera: Conjunctivae normal.  Cardiovascular:     Rate and Rhythm: Normal rate and regular rhythm.     Heart sounds: Normal heart sounds. No murmur heard. Pulmonary:     Effort: Pulmonary effort is normal.     Breath sounds: Normal breath sounds.     Comments: Lungs clear to auscultation Musculoskeletal:        General: Normal range of motion.  Cervical back: Normal range of motion and neck supple.  Skin:    General: Skin is warm and dry.     Comments: Patient with 10 x 10 x 5 raised area on left upper breast.  Lesion differentiates of red and purple.  Neurological:     Mental Status: She is alert and oriented to person, place, and time.  Psychiatric:        Mood and Affect: Mood normal.        Behavior: Behavior normal.        Thought Content: Thought content normal.        Judgment: Judgment normal.    Assessment and Plan: No diagnosis found.  Meds ordered this encounter  Medications   Fluocinonide  0.1 % CREA    Sig: Apply 1 Application topically daily as  needed (Use for two weeks at a time.). APPLY ONCE DAILY TO AFFECTED AREA AS NEEDED FOR UP TO 2 WEEKS AT A TIME    Dispense:  360 g    Refill:  3    Pa not needed $7 copay    Patient Instructions  Allergic rhinitis Continue allergen avoidance measures directed toward grass pollen, ragweed pollen, mold, cat, and dust mite as listed below Continue cetirizine 10 mg once a day if needed for runny nose or itch Continue Flonase  2 sprays in each nostril once a day if needed for stuffy nose. In the right nostril, point the applicator out toward the right ear. In the left nostril, point the applicator out toward the left ear Consider saline nasal rinses as needed for nasal symptoms. Use this before any medicated nasal sprays for best result Consider allergen immunotherapy if your symptoms are not well-controlled with the treatment plan as listed above  Atopic dermatitis Continue a twice a day moisturizing routine Continue Dupixent  300 mg once every 2 weeks for control of atopic dermatitis  Skin lesion/hemangioma Refer to dermatology for evaluation  Call the clinic if this treatment plan is not working well for you.  Follow up in 1 year or sooner if needed.  Return in about 1 year (around 02/03/2025), or if symptoms worsen or fail to improve.    Thank you for the opportunity to care for this patient.  Please do not hesitate to contact me with questions.  Arlean Mutter, FNP [facility] 

## 2024-02-04 NOTE — Patient Instructions (Addendum)
 Allergic rhinitis Continue allergen avoidance measures directed toward grass pollen, ragweed pollen, mold, cat, and dust mite as listed below Continue cetirizine 10 mg once a day if needed for runny nose or itch Continue Flonase  2 sprays in each nostril once a day if needed for stuffy nose. In the right nostril, point the applicator out toward the right ear. In the left nostril, point the applicator out toward the left ear Consider saline nasal rinses as needed for nasal symptoms. Use this before any medicated nasal sprays for best result Consider allergen immunotherapy if your symptoms are not well-controlled with the treatment plan as listed above  Atopic dermatitis Continue a twice a day moisturizing routine Continue Dupixent  300 mg once every 2 weeks for control of atopic dermatitis  Skin lesion/hemangioma Refer to dermatology for evaluation  Call the clinic if this treatment plan is not working well for you.  Follow up in 1 year or sooner if needed.  Reducing Pollen Exposure The American Academy of Allergy , Asthma and Immunology suggests the following steps to reduce your exposure to pollen during allergy  seasons. Do not hang sheets or clothing out to dry; pollen may collect on these items. Do not mow lawns or spend time around freshly cut grass; mowing stirs up pollen. Keep windows closed at night.  Keep car windows closed while driving. Minimize morning activities outdoors, a time when pollen counts are usually at their highest. Stay indoors as much as possible when pollen counts or humidity is high and on windy days when pollen tends to remain in the air longer. Use air conditioning when possible.  Many air conditioners have filters that trap the pollen spores. Use a HEPA room air filter to remove pollen form the indoor air you breathe.  Control of Mold Allergen Mold and fungi can grow on a variety of surfaces provided certain temperature and moisture conditions exist.  Outdoor  molds grow on plants, decaying vegetation and soil.  The major outdoor mold, Alternaria and Cladosporium, are found in very high numbers during hot and dry conditions.  Generally, a late Summer - Fall peak is seen for common outdoor fungal spores.  Rain will temporarily lower outdoor mold spore count, but counts rise rapidly when the rainy period ends.  The most important indoor molds are Aspergillus and Penicillium.  Dark, humid and poorly ventilated basements are ideal sites for mold growth.  The next most common sites of mold growth are the bathroom and the kitchen.  Outdoor [employer] Use air conditioning and keep windows closed Avoid exposure to decaying vegetation. Avoid leaf raking. Avoid grain handling. Consider wearing a face mask if working in moldy areas.  Indoor Mold Control Maintain humidity below 50%. Clean washable surfaces with 5% bleach solution. Remove sources e.g. Contaminated carpets.  Control of Dog or Cat Allergen Avoidance is the best way to manage a dog or cat allergy . If you have a dog or cat and are allergic to dog or cats, consider removing the dog or cat from the home. If you have a dog or cat but don't want to find it a new home, or if your family wants a pet even though someone in the household is allergic, here are some strategies that may help keep symptoms at bay:  Keep the pet out of your bedroom and restrict it to only a few rooms. Be advised that keeping the dog or cat in only one room will not limit the allergens to that room. Don't pet, hug or  kiss the dog or cat; if you do, wash your hands with soap and water . High-efficiency particulate air (HEPA) cleaners run continuously in a bedroom or living room can reduce allergen levels over time. Regular use of a high-efficiency vacuum cleaner or a central vacuum can reduce allergen levels. Giving your dog or cat a bath at least once a week can reduce airborne allergen.   Control of Dust Mite Allergen Dust  mites play a major role in allergic asthma and rhinitis. They occur in environments with high humidity wherever human skin is found. Dust mites absorb humidity from the atmosphere (ie, they do not drink) and feed on organic matter (including shed human and animal skin). Dust mites are a microscopic type of insect that you cannot see with the naked eye. High levels of dust mites have been detected from mattresses, pillows, carpets, upholstered furniture, bed covers, clothes, soft toys and any woven material. The principal allergen of the dust mite is found in its feces. A gram of dust may contain 1,000 mites and 250,000 fecal particles. Mite antigen is easily measured in the air during house cleaning activities. Dust mites do not bite and do not cause harm to humans, other than by triggering allergies/asthma.  Ways to decrease your exposure to dust mites in your home:  1. Encase mattresses, box springs and pillows with a mite-impermeable barrier or cover  2. Wash sheets, blankets and drapes weekly in hot water  (130 F) with detergent and dry them in a dryer on the hot setting.  3. Have the room cleaned frequently with a vacuum cleaner and a damp dust-mop. For carpeting or rugs, vacuuming with a vacuum cleaner equipped with a high-efficiency particulate air (HEPA) filter. The dust mite allergic individual should not be in a room which is being cleaned and should wait 1 hour after cleaning before going into the room.  4. Do not sleep on upholstered furniture (eg, couches).  5. If possible removing carpeting, upholstered furniture and drapery from the home is ideal. Horizontal blinds should be eliminated in the rooms where the person spends the most time (bedroom, study, television room). Washable vinyl, roller-type shades are optimal.  6. Remove all non-washable stuffed toys from the bedroom. Wash stuffed toys weekly like sheets and blankets above.  7. Reduce indoor humidity to less than 50%.  Inexpensive humidity monitors can be purchased at most hardware stores. Do not use a humidifier as can make the problem worse and are not recommended.

## 2024-02-07 ENCOUNTER — Other Ambulatory Visit (HOSPITAL_COMMUNITY): Payer: Self-pay

## 2024-02-07 ENCOUNTER — Telehealth: Payer: Self-pay

## 2024-02-07 NOTE — Telephone Encounter (Signed)
*  AA  Pharmacy Patient Advocate Encounter   Received notification from CoverMyMeds that prior authorization for Fluocinonide  0.1% cream  is required/requested.   Insurance verification completed.   The patient is insured through [facility] .   Per test claim:  Halobetasol 0.05% Cream is preferred by the insurance.  If suggested medication is appropriate, Please send in a new RX and discontinue this one. If not, please advise as to why it's not appropriate so that we may request a Prior Authorization. Please note, some preferred medications may still require a PA.  If the suggested medications have not been trialed and there are no contraindications to their use, the PA will not be submitted, as it will not be approved.   CMM Key: [id number]

## 2024-02-15 DIAGNOSIS — B351 Tinea unguium: Secondary | ICD-10-CM | POA: Diagnosis not present

## 2024-02-15 NOTE — Telephone Encounter (Signed)
 Can you please send in fluocinonide  0.05% solution to apply to red and itchy areas up to twice a day? Thank you

## 2024-02-16 MED ORDER — FLUOCINONIDE 0.05 % EX SOLN: CUTANEOUS | 11 refills | Status: AC

## 2024-02-16 NOTE — Addendum Note (Signed)
 Addended by: MENDEZ-MUNGARAY, Raford Brissett M on: 02/16/2024 12:20 PM   Modules accepted: Orders

## 2024-02-16 NOTE — Telephone Encounter (Signed)
 I called the patient and left a message to call the office back to inform of medication change. Solution has been sent into the pharmacy.

## 2024-02-16 NOTE — Telephone Encounter (Signed)
 The patient called the office back and I informed solution has been sent in. The patient said that her next Dupixent  injection date is coming up. She asked the renewal script be sent. I informed the patient I would send message to biologic coordinator.

## 2024-02-17 ENCOUNTER — Other Ambulatory Visit: Payer: Self-pay | Admitting: *Deleted

## 2024-02-17 DIAGNOSIS — L2084 Intrinsic (allergic) eczema: Secondary | ICD-10-CM

## 2024-02-17 MED ORDER — DUPIXENT 300 MG/2ML ~~LOC~~ SOAJ
300.0000 mg | SUBCUTANEOUS | 3 refills | Status: DC
Start: 2024-02-17 — End: 2024-05-11

## 2024-02-17 NOTE — Telephone Encounter (Signed)
 Refill sent to [facility]. Patient advised

## 2024-03-03 ENCOUNTER — Ambulatory Visit: Payer: Medicare HMO

## 2024-03-03 ENCOUNTER — Encounter: Payer: Self-pay | Admitting: Family Medicine

## 2024-03-03 VITALS — Ht 63.0 in | Wt 232.0 lb

## 2024-03-03 DIAGNOSIS — L988 Other specified disorders of the skin and subcutaneous tissue: Secondary | ICD-10-CM

## 2024-03-03 DIAGNOSIS — Z780 Asymptomatic menopausal state: Secondary | ICD-10-CM

## 2024-03-03 DIAGNOSIS — Z0000 Encounter for general adult medical examination without abnormal findings: Secondary | ICD-10-CM

## 2024-03-03 NOTE — Progress Notes (Signed)
 Subjective:   Julie Brown is a 73 y.o. who presents for a Medicare Wellness preventive visit.  As a reminder, Annual Wellness Visits don't include a physical exam, and some assessments may be limited, especially if this visit is performed virtually. We may recommend an in-person follow-up visit with your provider if needed.  Visit Complete: Virtual I connected with  Julie Brown on 03/03/24 by a audio enabled telemedicine application and verified that I am speaking with the correct person using two identifiers.  Patient Location: Home  Provider Location: Home Office  I discussed the limitations of evaluation and management by telemedicine. The patient expressed understanding and agreed to proceed.  Vital Signs: Because this visit was a virtual/telehealth visit, some criteria may be missing or patient reported. Any vitals not documented were not able to be obtained and vitals that have been documented are patient reported.  VideoDeclined- This patient declined Librarian, academic. Therefore the visit was completed with audio only.  Persons Participating in Visit: Patient.  AWV Questionnaire: No: Patient Medicare AWV questionnaire was not completed prior to this visit.  Cardiac Risk Factors include: advanced age (>53men, >51 women);dyslipidemia;hypertension     Objective:    Today's Vitals   03/03/24 1313  Weight: 232 lb (105.2 kg)  Height: 5' 3 (1.6 m)   Body mass index is 41.1 kg/m.     03/03/2024    1:15 PM 02/26/2023    1:38 PM 06/06/2021    1:46 PM 06/02/2021    9:10 AM 05/23/2021    2:49 PM 08/15/2020   10:27 AM 08/13/2020   10:31 AM  Advanced Directives  Does Patient Have a Medical Advance Directive? No No Yes Yes Yes No Yes  Type of Advance Directive   Living will Living will Healthcare Power of [place];Living will  Living will  Does patient want to make changes to medical advance directive?   No - Patient declined      Copy of  Healthcare Power of Attorney in Chart?   No - copy requested No - copy requested No - copy requested    Would patient like information on creating a medical advance directive? Yes (MAU/Ambulatory/Procedural Areas - Information given) No - Patient declined    No - Patient declined     Current Medications (verified) Outpatient Encounter Medications as of 03/03/2024  Medication Sig   B Complex-Biotin-FA (SUPER B-COMPLEX PO) Take 1 tablet by mouth daily.    Dupilumab  (DUPIXENT ) 300 MG/2ML SOAJ Inject 300 mg into the skin every 14 (fourteen) days.   fluocinonide  (LIDEX ) 0.05 % external solution Apply to red and itchy areas up to twice a day   fluticasone  (FLONASE ) 50 MCG/ACT nasal spray Use 2 spray(s) in each nostril once daily   furosemide  (LASIX ) 20 MG tablet Take 1 tablet by mouth once daily   Multiple Vitamins-Calcium  (ONE-A-DAY WOMENS PO) Take 1 tablet by mouth daily.    Multiple Vitamins-Minerals (ZINC PO) Take 50 mg by mouth daily.   potassium chloride  (KLOR-CON ) 10 MEQ tablet Take 1 tablet by mouth once daily   POTASSIUM CHLORIDE  ER PO Take 1 tablet by mouth daily.   rosuvastatin  (CRESTOR ) 10 MG tablet Take 1 tablet by mouth once daily   Zinc Gluconate 10 MG LOZG    itraconazole (SPORANOX) 100 MG capsule Take 200 mg by mouth daily. (Patient not taking: Reported on 03/03/2024)   No facility-administered encounter medications on file as of 03/03/2024.    Allergies (verified) Patient has  no known allergies.   History: Past Medical History:  Diagnosis Date   Allergy     Eczema    Hayfever    Hyperlipidemia    Migraine headache    PONV (postoperative nausea and vomiting)    Past Surgical History:  Procedure Laterality Date   BIOPSY  08/15/2020   Procedure: BIOPSY;  Surgeon: Shaaron Lamar HERO, MD;  [facility];  Service: Endoscopy;;   BREAST SURGERY     lump removed   CATARACT EXTRACTION W/PHACO Right 06/02/2021   Procedure: CATARACT EXTRACTION PHACO AND INTRAOCULAR LENS  PLACEMENT RIGHT EYE;  Surgeon: Harrie Agent, MD;  Location: [facility];  Service: Ophthalmology;  Laterality: Right;  right CDE=8.77   CATARACT EXTRACTION W/PHACO Left 06/16/2021   Procedure: CATARACT EXTRACTION LEFT EYE W/ PHACO AND INTRAOCULAR LENS PLACEMENT (IOC);  Surgeon: Harrie Agent, MD;  Location: [facility];  Service: Ophthalmology;  Laterality: Left;  CDE: 6.86    CHOLECYSTECTOMY  01/06/2012   Procedure: LAPAROSCOPIC CHOLECYSTECTOMY;  Surgeon: Thresa JAYSON Pulling, MD;  Location: [facility];  Service: General;  Laterality: N/A;   COLONOSCOPY  07/22/2006   MFM:mzrujo and colon polyps/left-sided diverticula. adenomatous colon polyps. surveillance TCS due 2013   COLONOSCOPY N/A 12/25/2015   sigmoid diverticulosis, multiple colonic polyps removed, s/p segmental colonic biopsies. Tubular adenomas, negative microscopic colitis. Surveillance 2022.    COLONOSCOPY WITH PROPOFOL  N/A 08/15/2020   Sigmoid and descending colon diverticulosis, s/p segmental biopsy. 5 year colonoscopy. Biopsy without microscopic colitis.    ESOPHAGOGASTRODUODENOSCOPY  07/22/2006   MFM:wnmfjo/dfjoo HH otherwise normal   EYE SURGERY     FOOT SURGERY     left foot   KNEE SURGERY Left    MENISCUS REPAIR Right 12/28/2017   TUBAL LIGATION     Family History  Problem Relation Age of Onset   Colon cancer Mother        age 38   Kidney cancer Mother        cause of death, 37 cancer surgeries, died at age 71   Diabetes Brother    Hypertension Brother    Cancer Brother    Social History   Socioeconomic History   Marital status: Married    Spouse name: Not on file   Number of children: 1   Years of education: Not on file   Highest education level: Associate degree: academic program  Occupational History   Occupation: bookeeping   Tobacco Use   Smoking status: Former   Smokeless tobacco: Never   Tobacco comments:    quit 1990  Vaping Use   Vaping status: Never Used  Substance and Sexual Activity   Alcohol use: Not  Currently    Alcohol/week: 0.0 standard drinks of alcohol    Comment: occasional, not daily   Drug use: No   Sexual activity: Not Currently    Birth control/protection: Post-menopausal  Other Topics Concern   Not on file  Social History Narrative   Not on file   Social Drivers of Health   Financial Resource Strain: Low Risk  (03/03/2024)   Overall Financial Resource Strain (CARDIA)    Difficulty of Paying Living Expenses: Not hard at all  Food Insecurity: No Food Insecurity (03/03/2024)   Hunger Vital Sign    Worried About Running Out of Food in the Last Year: Never true    Ran Out of Food in the Last Year: Never true  Transportation Needs: No Transportation Needs (03/03/2024)   PRAPARE - Transportation    Lack of Transportation (  Medical): No    Lack of Transportation (Non-Medical): No  Physical Activity: Insufficiently Active (03/03/2024)   Exercise Vital Sign    Days of Exercise per Week: 2 days    Minutes of Exercise per Session: 30 min  Stress: No Stress Concern Present (03/03/2024)   [employer] of Occupational Health - Occupational Stress Questionnaire    Feeling of Stress: Not at all  Social Connections: Socially Integrated (03/03/2024)   Social Connection and Isolation Panel    Frequency of Communication with Friends and Family: More than three times a week    Frequency of Social Gatherings with Friends and Family: Once a week    Attends Religious Services: More than 4 times per year    Active Member of [employer] or Organizations: Yes    Attends Engineer, structural: More than 4 times per year    Marital Status: Married    Tobacco Counseling Counseling given: Not Answered Tobacco comments: quit 1990    Clinical Intake:  Pre-visit preparation completed: Yes  Pain : No/denies pain  Diabetes: No  Lab Results  Component Value Date   HGBA1C 5.2 11/26/2021   HGBA1C 5.2 11/08/2017     How often do you need to have someone help you when you read  instructions, pamphlets, or other written materials from your doctor or pharmacy?: 1 - Never  Interpreter Needed?: No  Information entered by :: Charmaine Bloodgood LPN   Activities of Daily Living     03/03/2024    1:15 PM  In your present state of health, do you have any difficulty performing the following activities:  Hearing? 0  Vision? 0  Difficulty concentrating or making decisions? 0  Walking or climbing stairs? 0  Dressing or bathing? 0  Doing errands, shopping? 0  Preparing Food and eating ? N  Using the Toilet? N  In the past six months, have you accidently leaked urine? N  Do you have problems with loss of bowel control? N  Managing your Medications? N  Managing your Finances? N  Housekeeping or managing your Housekeeping? N    Patient Care Team: Cook, Jayce G, DO as PCP - General (Family Medicine) Livingston Rigg, MD as Consulting Physician (Dermatology) Iva Marty Saltness, MD as Consulting Physician (Allergy  and Immunology) Blinda Katz, DPM as Consulting Physician (Podiatry)  I have updated your Care Teams any recent Medical Services you may have received from other providers in the past year.     Assessment:   This is a routine wellness examination for Julie Brown.  Hearing/Vision screen Hearing Screening - Comments:: Denies hearing difficulties   Vision Screening - Comments:: No vision problems    Goals Addressed             This Visit's Progress    Maintain health and independence   On track      Depression Screen     03/03/2024    1:14 PM 10/28/2023    1:54 PM 06/14/2023    3:29 PM 02/26/2023    1:44 PM 09/28/2022    9:41 AM 05/08/2022    1:33 PM 05/05/2022   10:11 AM  PHQ 2/9 Scores  PHQ - 2 Score 0 0 0 0 0 0 0  PHQ- 9 Score  2 1  0 2     Fall Risk     03/03/2024    1:15 PM 10/28/2023    1:54 PM 02/26/2023    1:48 PM 09/28/2022    9:41 AM 05/05/2022  10:11 AM  Fall Risk   Falls in the past year? 0 1 0 0 0  Number falls in past yr:  0 0 0 0 0  Injury with Fall? 0 0 0 0 0  Risk for fall due to : Impaired mobility  No Fall Risks No Fall Risks No Fall Risks  Follow up Falls prevention discussed;Education provided;Falls evaluation completed  Falls prevention discussed Falls evaluation completed Falls evaluation completed      Data saved with a previous flowsheet row definition    MEDICARE RISK AT HOME:  Medicare Risk at Home Any stairs in or around the home?: No If so, are there any without handrails?: No Home free of loose throw rugs in walkways, pet beds, electrical cords, etc?: Yes Adequate lighting in your home to reduce risk of falls?: Yes Life alert?: No Use of a cane, walker or w/c?: No Grab bars in the bathroom?: Yes Shower chair or bench in shower?: No Elevated toilet seat or a handicapped toilet?: Yes  TIMED UP AND GO:  Was the test performed?  No  Cognitive Function: 6CIT completed        03/03/2024    1:15 PM 02/26/2023    1:44 PM  6CIT Screen  What Year? 0 points 0 points  What month? 0 points 0 points  What time? 0 points 0 points  Count back from 20 0 points 0 points  Months in reverse 0 points 0 points  Repeat phrase 0 points 0 points  Total Score 0 points 0 points    Immunizations Immunization History  Administered Date(s) Administered   INFLUENZA, HIGH DOSE SEASONAL PF 05/05/2018, 03/31/2019, 04/08/2021, 03/12/2023   Influenza,inj,quad, With Preservative 03/06/2017, 03/06/2018   Influenza-Unspecified 03/06/2010, 03/24/2014, 02/25/2016, 02/26/2017, 05/05/2018, 04/12/2020, 05/01/2022   Moderna Covid-19 Fall Seasonal Vaccine 23yrs & older 04/09/2023   Moderna Covid-19 Vaccine Bivalent Booster 30yrs & up 04/25/2021   Moderna Sars-Covid-2 Vaccination 08/31/2019, 09/29/2019, 05/03/2020, 11/18/2020   PNEUMOCOCCAL CONJUGATE-20 11/26/2021   Pneumococcal Polysaccharide-23 05/01/2022   Tdap 04/09/2023   Zoster Recombinant(Shingrix ) 10/17/2019, 12/13/2019    Screening Tests Health  Maintenance  Topic Date Due   DEXA SCAN  09/01/2023   COVID-19 Vaccine (7 - Moderna risk 2024-25 season) 10/08/2023   INFLUENZA VACCINE  02/04/2024   MAMMOGRAM  09/19/2024   Medicare Annual Wellness (AWV)  03/03/2025   Colonoscopy  08/15/2025   DTaP/Tdap/Td (2 - Td or Tdap) 04/08/2033   Pneumococcal Vaccine: 50+ Years  Completed   Hepatitis C Screening  Completed   Zoster Vaccines- Shingrix   Completed   HPV VACCINES  Aged Out   Meningococcal B Vaccine  Aged Out    Health Maintenance  Health Maintenance Due  Topic Date Due   DEXA SCAN  09/01/2023   COVID-19 Vaccine (7 - Moderna risk 2024-25 season) 10/08/2023   INFLUENZA VACCINE  02/04/2024   Health Maintenance Items Addressed: DEXA ordered; Information provided on vaccine recommendations   Additional Screening:  Vision Screening: Recommended annual ophthalmology exams for early detection of glaucoma and other disorders of the eye. Would you like a referral to an eye doctor? No    Dental Screening: Recommended annual dental exams for proper oral hygiene  Community Resource Referral / Chronic Care Management: CRR required this visit?  No   CCM required this visit?  No   Plan:    I have personally reviewed and noted the following in the patient's chart:   Medical and social history Use of alcohol, tobacco or illicit drugs  Current medications and supplements including opioid prescriptions. Patient is not currently taking opioid prescriptions. Functional ability and status Nutritional status Physical activity Advanced directives List of other physicians Hospitalizations, surgeries, and ER visits in previous 12 months Vitals Screenings to include cognitive, depression, and falls Referrals and appointments  In addition, I have reviewed and discussed with patient certain preventive protocols, quality metrics, and best practice recommendations. A written personalized care plan for preventive services as well as  general preventive health recommendations were provided to patient.   Lavelle Pfeiffer [place], [place]   1/70/7974   After Visit Summary: (MyChart) Due to this being a telephonic visit, the after visit summary with patients personalized plan was offered to patient via MyChart   Notes: Nothing significant to report at this time.

## 2024-03-03 NOTE — Patient Instructions (Addendum)
 Julie Brown , Thank you for taking time out of your busy schedule to complete your Annual Wellness Visit with me. I enjoyed our conversation and look forward to speaking with you again next year. I, as well as your care team,  appreciate your ongoing commitment to your health goals. Please review the following plan we discussed and let me know if I can assist you in the future. Your Game plan/ To Do List    Referrals: You have an order for:  []   2D Mammogram  []   3D Mammogram  [x]   Bone Density     Please call for appointment:   [facility] at [facility] [address] [place], [place] [address] [phone]   Make sure to wear two-piece clothing.  No lotions, powders, or deodorants the day of the appointment. Make sure to bring picture ID and insurance card.  Bring list of medications you are currently taking including any supplements.   Follow up Visits: We will see or speak with you next year for your Next Medicare AWV with our clinical staff Have you seen your provider in the last 6 months (3 months if uncontrolled diabetes)? Yes  Clinician Recommendations:  Aim for 30 minutes of exercise or brisk walking, 6-8 glasses of water , and 5 servings of fruits and vegetables each day.       This is a list of the screenings recommended for you:  Health Maintenance  Topic Date Due   DEXA scan (bone density measurement)  09/01/2023   COVID-19 Vaccine (7 - Moderna risk 2024-25 season) 10/08/2023   Flu Shot  02/04/2024   Mammogram  09/19/2024   Medicare Annual Wellness Visit  03/03/2025   Colon Cancer Screening  08/15/2025   DTaP/Tdap/Td vaccine (2 - Td or Tdap) 04/08/2033   Pneumococcal Vaccine for age over 29  Completed   Hepatitis C Screening  Completed   Zoster (Shingles) Vaccine  Completed   HPV Vaccine  Aged Out   Meningitis B Vaccine  Aged Out    Advanced directives: (ACP Link)Information on Advanced Care Planning can be found at [place]   Secretary of [facility] Advance Health Care Directives Advance Health Care Directives. [email or web address]   Advance Care Planning is important because it:  [x]  Makes sure you receive the medical care that is consistent with your values, goals, and preferences  [x]  It provides guidance to your family and loved ones and reduces their decisional burden about whether or not they are making the right decisions based on your wishes.  Follow the link provided in your after visit summary or read over the paperwork we have mailed to you to help you started getting your Advance Directives in place. If you need assistance in completing these, please reach out to us  so that we can help you!  See attachments for Preventive Care and Fall Prevention Tips.

## 2024-03-10 ENCOUNTER — Other Ambulatory Visit: Payer: Self-pay | Admitting: Family Medicine

## 2024-03-13 ENCOUNTER — Ambulatory Visit (HOSPITAL_COMMUNITY)
Admission: RE | Admit: 2024-03-13 | Discharge: 2024-03-13 | Disposition: A | Discharge status: Home / Self Care | Source: Ambulatory Visit | Attending: Family Medicine | Admitting: Family Medicine

## 2024-03-13 ENCOUNTER — Other Ambulatory Visit: Payer: Self-pay | Admitting: Family Medicine

## 2024-03-13 ENCOUNTER — Ambulatory Visit: Payer: Self-pay | Admitting: Family Medicine

## 2024-03-13 ENCOUNTER — Other Ambulatory Visit: Payer: Self-pay

## 2024-03-13 DIAGNOSIS — R600 Localized edema: Secondary | ICD-10-CM

## 2024-03-13 DIAGNOSIS — Z780 Asymptomatic menopausal state: Secondary | ICD-10-CM | POA: Insufficient documentation

## 2024-03-13 MED ORDER — POTASSIUM CHLORIDE ER 10 MEQ PO TBCR: 10.0000 meq | EXTENDED_RELEASE_TABLET | Freq: Every day | ORAL | 0 refills | Status: DC

## 2024-03-13 MED ORDER — FUROSEMIDE 20 MG PO TABS: 20.0000 mg | ORAL_TABLET | Freq: Every day | ORAL | 0 refills | Status: DC

## 2024-03-16 ENCOUNTER — Encounter: Payer: Self-pay | Admitting: Family Medicine

## 2024-03-20 DIAGNOSIS — M25551 Pain in right hip: Secondary | ICD-10-CM | POA: Insufficient documentation

## 2024-03-20 DIAGNOSIS — M7062 Trochanteric bursitis, left hip: Secondary | ICD-10-CM | POA: Diagnosis not present

## 2024-03-20 DIAGNOSIS — M7061 Trochanteric bursitis, right hip: Secondary | ICD-10-CM | POA: Diagnosis not present

## 2024-04-13 DIAGNOSIS — R030 Elevated blood-pressure reading, without diagnosis of hypertension: Secondary | ICD-10-CM | POA: Diagnosis not present

## 2024-04-13 DIAGNOSIS — J019 Acute sinusitis, unspecified: Secondary | ICD-10-CM | POA: Diagnosis not present

## 2024-04-13 DIAGNOSIS — U071 COVID-19: Secondary | ICD-10-CM | POA: Diagnosis not present

## 2024-04-20 ENCOUNTER — Encounter: Payer: Self-pay | Admitting: Dermatology

## 2024-04-20 ENCOUNTER — Ambulatory Visit: Admitting: Dermatology

## 2024-04-20 VITALS — BP 119/80 | HR 86

## 2024-04-20 DIAGNOSIS — I8290 Acute embolism and thrombosis of unspecified vein: Secondary | ICD-10-CM | POA: Diagnosis not present

## 2024-04-20 DIAGNOSIS — D1801 Hemangioma of skin and subcutaneous tissue: Secondary | ICD-10-CM

## 2024-04-20 DIAGNOSIS — D1809 Hemangioma of other sites: Secondary | ICD-10-CM | POA: Diagnosis not present

## 2024-04-20 DIAGNOSIS — D485 Neoplasm of uncertain behavior of skin: Secondary | ICD-10-CM | POA: Diagnosis not present

## 2024-04-20 NOTE — Progress Notes (Signed)
   New Patient Visit   Subjective  Julie Brown is a 73 y.o. female who presents for the following: Skin Lesion  Patient states she has spot located at the chest that she would like to have examined. Patient reports the areas have been there for 1 year. She reports the areas are not bothersome.She states that the areas has gotten bigger in size. Patient reports she has not previously been treated for these areas.She reports throughout her lifetime she has had Severe sun exposure. She reports currently, she weara 100 SPF. Patient denies Hx of bx. Patient denies family history of skin cancer(s).  The patient has spots, moles and lesions to be evaluated, some may be new or changing and the patient may have concern these could be cancer.  The following portions of the chart were reviewed this encounter and updated as appropriate: medications, allergies, medical history  Review of Systems:  No other skin or systemic complaints except as noted in HPI or Assessment and Plan.  Objective  Well appearing patient in no apparent distress; mood and affect are within normal limits.  A focused examination was performed of the following areas: Left Chest  Relevant exam findings are noted in the Assessment and Plan.      Left Breast 8 mm red papule   Assessment & Plan  NEOPLASM OF UNCERTAIN BEHAVIOR OF SKIN Left Breast Epidermal / dermal shaving  Lesion diameter (cm):  0.8 Informed consent: discussed and consent obtained   Timeout: patient name, date of birth, surgical site, and procedure verified   Procedure prep:  Patient was prepped and draped in usual sterile fashion Prep type:  Isopropyl alcohol Anesthesia: the lesion was anesthetized in a standard fashion   Anesthetic:  1% lidocaine  w/ epinephrine  1-100,000 buffered w/ 8.4% NaHCO3 Instrument used: DermaBlade   Hemostasis achieved with: aluminum chloride and electrodesiccation   Outcome: patient tolerated procedure well   Post-procedure  details: sterile dressing applied and wound care instructions given   Dressing type: petrolatum   Additional details:  Pt aware that benign results will be sent to mychart and the staff will call abnormal results will  Specimen 1 - Surgical pathology Differential Diagnosis: R/O Cherry Angioma vs other  Check Margins: No  Return if symptoms worsen or fail to improve.  I, Dominica Kent, am acting as Neurosurgeon for [employer], DO.  Documentation: I have reviewed the above documentation for accuracy and completeness, and I agree with the above.  Delon Lenis, DO

## 2024-04-20 NOTE — Patient Instructions (Addendum)

## 2024-04-21 LAB — SURGICAL PATHOLOGY

## 2024-04-25 ENCOUNTER — Ambulatory Visit: Payer: Self-pay | Admitting: Dermatology

## 2024-05-09 ENCOUNTER — Other Ambulatory Visit: Payer: Self-pay | Admitting: Family Medicine

## 2024-05-09 DIAGNOSIS — J302 Other seasonal allergic rhinitis: Secondary | ICD-10-CM

## 2024-05-11 ENCOUNTER — Other Ambulatory Visit: Payer: Self-pay | Admitting: *Deleted

## 2024-05-11 DIAGNOSIS — L2084 Intrinsic (allergic) eczema: Secondary | ICD-10-CM

## 2024-05-11 MED ORDER — DUPIXENT 300 MG/2ML ~~LOC~~ SOAJ: 300.0000 mg | SUBCUTANEOUS | 3 refills | Status: AC

## 2024-05-16 DIAGNOSIS — M79675 Pain in left toe(s): Secondary | ICD-10-CM | POA: Diagnosis not present

## 2024-05-16 DIAGNOSIS — L600 Ingrowing nail: Secondary | ICD-10-CM | POA: Diagnosis not present

## 2024-05-16 DIAGNOSIS — B351 Tinea unguium: Secondary | ICD-10-CM | POA: Diagnosis not present

## 2024-06-12 ENCOUNTER — Encounter: Payer: Self-pay | Admitting: Family Medicine

## 2024-06-12 ENCOUNTER — Ambulatory Visit: Admitting: Dermatology

## 2024-06-12 ENCOUNTER — Ambulatory Visit: Admitting: Family Medicine

## 2024-06-12 VITALS — BP 170/77 | HR 93 | Temp 98.4°F | Ht 63.0 in | Wt 232.0 lb

## 2024-06-12 DIAGNOSIS — J988 Other specified respiratory disorders: Secondary | ICD-10-CM | POA: Diagnosis not present

## 2024-06-12 MED ORDER — AMOXICILLIN-POT CLAVULANATE 875-125 MG PO TABS: 1.0000 | ORAL_TABLET | Freq: Two times a day (BID) | ORAL | 0 refills | Status: DC

## 2024-06-12 MED ORDER — PREDNISONE 50 MG PO TABS: 50.0000 mg | ORAL_TABLET | Freq: Every day | ORAL | 0 refills | Status: AC

## 2024-06-12 NOTE — Assessment & Plan Note (Signed)
 Would benefit from GLP1. Will follow up after the first of the year.

## 2024-06-12 NOTE — Progress Notes (Signed)
 Subjective:  Patient ID: Julie Brown, female    DOB: Nov 04, 1950  Age: 73 y.o. MRN: [id number]  CC:   Chief Complaint  Patient presents with   cough and congestion    Productive cough , nasal congestion, Headache, no fever 9 days taking OTC meds    HPI:  73 year old female presents for evaluation of the above.  Patient reports that she has been sick for the past 9 days.  Productive cough, congestion, sore throat, headache pain and pressure.  No fever.  No relief with over-the-counter treatment with ibuprofen, Zicam, Mucinex, NyQuil, Tylenol .  Has a history of recurrent sinusitis.  Patient Active Problem List   Diagnosis Date Noted   Respiratory infection 06/12/2024   Obesity, morbid (HCC) 06/12/2024   Pain in right hip 03/20/2024   Flexural atopic dermatitis 02/04/2024   Seasonal and perennial allergic rhinitis 02/04/2024   History of total right knee replacement 10/20/2022   Recurrent sinusitis 07/28/2022   Osteoarthritis of left knee 07/23/2021   Lichen sclerosus et atrophicus of the vulva 04/04/2021   Hyperlipidemia 01/16/2021   Subclinical hypothyroidism 06/17/2020   Hepatic steatosis 02/12/2009    Social Hx   Social History   Socioeconomic History   Marital status: Married    Spouse name: Not on file   Number of children: 1   Years of education: Not on file   Highest education level: Associate degree: occupational, scientist, product/process development, or vocational program  Occupational History   Occupation: bookeeping   Tobacco Use   Smoking status: Former   Smokeless tobacco: Never   Tobacco comments:    quit 1990  Vaping Use   Vaping status: Never Used  Substance and Sexual Activity   Alcohol use: Not Currently    Alcohol/week: 0.0 standard drinks of alcohol    Comment: occasional, not daily   Drug use: No   Sexual activity: Not Currently    Birth control/protection: Post-menopausal  Other Topics Concern   Not on file  Social History Narrative   Not on file   Social  Drivers of Health   Financial Resource Strain: Low Risk  (06/11/2024)   Overall Financial Resource Strain (CARDIA)    Difficulty of Paying Living Expenses: Not hard at all  Food Insecurity: No Food Insecurity (06/11/2024)   Hunger Vital Sign    Worried About Running Out of Food in the Last Year: Never true    Ran Out of Food in the Last Year: Never true  Transportation Needs: No Transportation Needs (06/11/2024)   PRAPARE - Administrator, Civil Service (Medical): No    Lack of Transportation (Non-Medical): No  Physical Activity: Insufficiently Active (06/11/2024)   Exercise Vital Sign    Days of Exercise per Week: 2 days    Minutes of Exercise per Session: 20 min  Stress: No Stress Concern Present (06/11/2024)   [employer] of Occupational Health - Occupational Stress Questionnaire    Feeling of Stress: Not at all  Social Connections: Socially Integrated (06/11/2024)   Social Connection and Isolation Panel    Frequency of Communication with Friends and Family: More than three times a week    Frequency of Social Gatherings with Friends and Family: Three times a week    Attends Religious Services: More than 4 times per year    Active Member of Clubs or Organizations: Yes    Attends Banker Meetings: More than 4 times per year    Marital Status: Married    Review  of Systems Per HPI  Objective:  BP (!) 170/77   Pulse 93   Temp 98.4 F (36.9 C)   Ht 5' 3 (1.6 m)   Wt 232 lb (105.2 kg)   SpO2 96%   BMI 41.10 kg/m      06/12/2024    1:03 PM 04/20/2024    1:50 PM 03/03/2024    1:13 PM  BP/Weight  Systolic BP 170 119 --  Diastolic BP 77 80 --  Wt. (Lbs) 232  232  BMI 41.1 kg/m2  41.1 kg/m2    Physical Exam Vitals and nursing note reviewed.  Constitutional:      General: She is not in acute distress.    Appearance: She is obese.  HENT:     Head: Normocephalic and atraumatic.     Ears:     Comments: Effusion is noted in both ears.    Nose:  Congestion present.  Pulmonary:     Effort: Pulmonary effort is normal. No respiratory distress.     Breath sounds: Wheezing present.  Neurological:     Mental Status: She is alert.     Lab Results  Component Value Date   WBC 5.4 11/26/2021   HGB 14.8 11/26/2021   HCT 43.8 11/26/2021   PLT 254 11/26/2021   GLUCOSE 104 (H) 11/26/2021   CHOL 240 (H) 11/26/2021   TRIG 146 11/26/2021   HDL 62 11/26/2021   LDLCALC 152 (H) 11/26/2021   ALT 22 11/26/2021   AST 17 11/26/2021   NA 141 11/26/2021   K 4.3 11/26/2021   CL 105 11/26/2021   CREATININE 0.82 11/26/2021   BUN 17 11/26/2021   CO2 22 11/26/2021   TSH 7.120 (H) 06/10/2020   HGBA1C 5.2 11/26/2021     Assessment & Plan:  Respiratory infection Assessment & Plan: Treating with Augmentin  and Prednisone .  Orders: -     Amoxicillin -Pot Clavulanate; Take 1 tablet by mouth 2 (two) times daily.  Dispense: 20 tablet; Refill: 0 -     predniSONE ; Take 1 tablet (50 mg total) by mouth daily for 5 days.  Dispense: 5 tablet; Refill: 0  Obesity, morbid (HCC) Assessment & Plan: Would benefit from GLP1. Will follow up after the first of the year.     Follow-up: As previously scheduled  Timon Geissinger DO [facility]

## 2024-06-12 NOTE — Assessment & Plan Note (Signed)
Treating with Augmentin and Prednisone.

## 2024-06-12 NOTE — Patient Instructions (Signed)
 Medication as prescribed.  Take care  Dr. Adriana Simas

## 2024-06-20 ENCOUNTER — Encounter: Payer: Self-pay | Admitting: Family Medicine

## 2024-06-21 ENCOUNTER — Other Ambulatory Visit: Payer: Self-pay | Admitting: Family Medicine

## 2024-06-21 MED ORDER — DOXYCYCLINE HYCLATE 100 MG PO TABS: 100.0000 mg | ORAL_TABLET | Freq: Two times a day (BID) | ORAL | 0 refills | Status: AC

## 2024-07-11 DIAGNOSIS — R600 Localized edema: Secondary | ICD-10-CM

## 2024-08-07 ENCOUNTER — Ambulatory Visit: Admitting: Internal Medicine

## 2024-08-29 ENCOUNTER — Ambulatory Visit: Admitting: Internal Medicine

## 2025-02-09 ENCOUNTER — Ambulatory Visit: Admitting: Allergy & Immunology

## 2025-03-09 ENCOUNTER — Ambulatory Visit
